# Patient Record
Sex: Female | Born: 1955 | Race: White | Hispanic: No | Marital: Married | Smoking: Former smoker
Health system: Southern US, Community
[De-identification: ages and names within clinical notes are randomized; demographics above are authoritative.]

## PROBLEM LIST (undated history)

## (undated) DIAGNOSIS — G4700 Insomnia, unspecified: Secondary | ICD-10-CM

## (undated) DIAGNOSIS — K760 Fatty (change of) liver, not elsewhere classified: Secondary | ICD-10-CM

## (undated) DIAGNOSIS — I10 Essential (primary) hypertension: Secondary | ICD-10-CM

## (undated) DIAGNOSIS — E785 Hyperlipidemia, unspecified: Secondary | ICD-10-CM

## (undated) DIAGNOSIS — M25561 Pain in right knee: Secondary | ICD-10-CM

## (undated) DIAGNOSIS — F320 Major depressive disorder, single episode, mild: Secondary | ICD-10-CM

## (undated) DIAGNOSIS — M25562 Pain in left knee: Secondary | ICD-10-CM

## (undated) DIAGNOSIS — M545 Low back pain, unspecified: Secondary | ICD-10-CM

## (undated) DIAGNOSIS — E669 Obesity, unspecified: Secondary | ICD-10-CM

## (undated) DIAGNOSIS — R0600 Dyspnea, unspecified: Secondary | ICD-10-CM

## (undated) DIAGNOSIS — F32A Depression, unspecified: Secondary | ICD-10-CM

## (undated) DIAGNOSIS — N951 Menopausal and female climacteric states: Secondary | ICD-10-CM

## (undated) DIAGNOSIS — R945 Abnormal results of liver function studies: Secondary | ICD-10-CM

## (undated) DIAGNOSIS — J309 Allergic rhinitis, unspecified: Secondary | ICD-10-CM

## (undated) DIAGNOSIS — M1990 Unspecified osteoarthritis, unspecified site: Secondary | ICD-10-CM

## (undated) DIAGNOSIS — K219 Gastro-esophageal reflux disease without esophagitis: Secondary | ICD-10-CM

## (undated) HISTORY — DX: Essential (primary) hypertension: I10

## (undated) HISTORY — DX: Pain in left knee: M25.561

## (undated) HISTORY — DX: Pain in left knee: M25.562

## (undated) HISTORY — DX: Insomnia, unspecified: G47.00

## (undated) HISTORY — DX: Hyperlipidemia, unspecified: E78.5

## (undated) HISTORY — DX: Low back pain: M54.5

## (undated) HISTORY — PX: LAPAROSCOPY ABDOMEN DIAGNOSTIC: PRO50

## (undated) HISTORY — DX: Major depressive disorder, single episode, mild: F32.0

## (undated) HISTORY — DX: Unspecified osteoarthritis, unspecified site: M19.90

## (undated) HISTORY — DX: Abnormal results of liver function studies: R94.5

## (undated) HISTORY — DX: Allergic rhinitis, unspecified: J30.9

## (undated) HISTORY — DX: Obesity, unspecified: E66.9

## (undated) HISTORY — DX: Menopausal and female climacteric states: N95.1

## (undated) HISTORY — DX: Low back pain, unspecified: M54.50

## (undated) HISTORY — DX: Depression, unspecified: F32.A

## (undated) HISTORY — DX: Gastro-esophageal reflux disease without esophagitis: K21.9

---

## 2000-06-06 HISTORY — PX: CATARACT EXTRACTION W/ INTRAOCULAR LENS IMPLANT: SHX1309

## 2006-09-05 ENCOUNTER — Ambulatory Visit: Payer: Self-pay | Admitting: Family Medicine

## 2006-09-05 DIAGNOSIS — I10 Essential (primary) hypertension: Secondary | ICD-10-CM | POA: Insufficient documentation

## 2006-09-05 HISTORY — PX: CARDIAC CATHETERIZATION: SHX172

## 2006-09-06 ENCOUNTER — Ambulatory Visit: Payer: Self-pay | Admitting: Cardiology

## 2006-09-06 LAB — CONVERTED CEMR LAB
BUN: 18 mg/dL (ref 6–23)
Basophils Absolute: 0 10*3/uL (ref 0.0–0.1)
Calcium: 9 mg/dL (ref 8.4–10.5)
Chloride: 108 meq/L (ref 96–112)
Creatinine, Ser: 0.9 mg/dL (ref 0.4–1.2)
Direct LDL: 159.2 mg/dL
Eosinophils Absolute: 0.1 10*3/uL (ref 0.0–0.6)
GFR calc non Af Amer: 71 mL/min
HCT: 36.3 % (ref 36.0–46.0)
HDL: 59.2 mg/dL (ref >39.0)
INR: 0.7 — ABNORMAL LOW (ref 0.9–2.0)
MCHC: 34.6 g/dL (ref 30.0–36.0)
MCV: 84.8 fL (ref 78.0–100.0)
Monocytes Relative: 9.4 % (ref 3.0–11.0)
Neutrophils Relative %: 46.8 % (ref 43.0–77.0)
Platelets: 313 10*3/uL (ref 150–400)
RBC: 4.29 M/uL (ref 3.87–5.11)
RDW: 15.6 % — ABNORMAL HIGH (ref 11.5–14.6)
Total CHOL/HDL Ratio: 4.5
VLDL: 58 mg/dL — ABNORMAL HIGH (ref 0–40)
aPTT: 20.1 s — ABNORMAL LOW (ref 26.5–36.5)

## 2006-09-08 ENCOUNTER — Inpatient Hospital Stay (HOSPITAL_BASED_OUTPATIENT_CLINIC_OR_DEPARTMENT_OTHER): Admission: RE | Admit: 2006-09-08 | Discharge: 2006-09-08 | Payer: Self-pay | Admitting: Cardiology

## 2006-09-08 ENCOUNTER — Ambulatory Visit: Payer: Self-pay | Admitting: Cardiovascular Disease

## 2006-10-04 ENCOUNTER — Ambulatory Visit: Payer: Self-pay | Admitting: Cardiology

## 2006-10-04 ENCOUNTER — Ambulatory Visit: Payer: Self-pay | Admitting: Family Medicine

## 2006-10-04 DIAGNOSIS — E785 Hyperlipidemia, unspecified: Secondary | ICD-10-CM | POA: Insufficient documentation

## 2006-10-04 DIAGNOSIS — K219 Gastro-esophageal reflux disease without esophagitis: Secondary | ICD-10-CM | POA: Insufficient documentation

## 2006-10-05 ENCOUNTER — Telehealth: Payer: Self-pay | Admitting: Family Medicine

## 2006-11-16 ENCOUNTER — Other Ambulatory Visit: Admission: RE | Admit: 2006-11-16 | Discharge: 2006-11-16 | Payer: Self-pay | Admitting: Family Medicine

## 2006-11-16 ENCOUNTER — Ambulatory Visit: Payer: Self-pay | Admitting: Family Medicine

## 2006-11-16 ENCOUNTER — Encounter: Payer: Self-pay | Admitting: Family Medicine

## 2006-11-16 ENCOUNTER — Encounter: Admission: RE | Admit: 2006-11-16 | Discharge: 2006-11-16 | Payer: Self-pay | Admitting: Family Medicine

## 2006-11-23 ENCOUNTER — Telehealth (INDEPENDENT_AMBULATORY_CARE_PROVIDER_SITE_OTHER): Payer: Self-pay | Admitting: *Deleted

## 2006-11-27 ENCOUNTER — Telehealth (INDEPENDENT_AMBULATORY_CARE_PROVIDER_SITE_OTHER): Payer: Self-pay | Admitting: *Deleted

## 2006-12-28 ENCOUNTER — Ambulatory Visit: Payer: Self-pay | Admitting: Family Medicine

## 2006-12-28 DIAGNOSIS — R0609 Other forms of dyspnea: Secondary | ICD-10-CM | POA: Insufficient documentation

## 2006-12-28 DIAGNOSIS — R0989 Other specified symptoms and signs involving the circulatory and respiratory systems: Secondary | ICD-10-CM | POA: Insufficient documentation

## 2007-01-24 ENCOUNTER — Encounter: Payer: Self-pay | Admitting: Family Medicine

## 2007-01-24 LAB — CONVERTED CEMR LAB
HDL: 62 mg/dL (ref >39)
LDL Cholesterol: 96 mg/dL (ref 0–99)
Triglycerides: 233 mg/dL — ABNORMAL HIGH (ref <150)

## 2007-03-30 ENCOUNTER — Ambulatory Visit: Payer: Self-pay | Admitting: Family Medicine

## 2007-03-30 DIAGNOSIS — F329 Major depressive disorder, single episode, unspecified: Secondary | ICD-10-CM | POA: Insufficient documentation

## 2007-07-10 ENCOUNTER — Ambulatory Visit: Payer: Self-pay | Admitting: Family Medicine

## 2007-11-07 ENCOUNTER — Ambulatory Visit: Payer: Self-pay | Admitting: Family Medicine

## 2007-11-07 DIAGNOSIS — R945 Abnormal results of liver function studies: Secondary | ICD-10-CM | POA: Insufficient documentation

## 2007-11-08 ENCOUNTER — Encounter: Payer: Self-pay | Admitting: Family Medicine

## 2007-11-08 LAB — CONVERTED CEMR LAB
ALT: 115 units/L — ABNORMAL HIGH (ref 0–35)
AST: 83 units/L — ABNORMAL HIGH (ref 0–37)

## 2007-11-12 LAB — CONVERTED CEMR LAB
HCV Ab: NEGATIVE
Hep B S Ab: NEGATIVE
Hepatitis B Surface Ag: NEGATIVE

## 2007-11-13 ENCOUNTER — Encounter: Admission: RE | Admit: 2007-11-13 | Discharge: 2007-11-13 | Payer: Self-pay | Admitting: Family Medicine

## 2007-12-04 ENCOUNTER — Other Ambulatory Visit: Admission: RE | Admit: 2007-12-04 | Discharge: 2007-12-04 | Payer: Self-pay | Admitting: Family Medicine

## 2007-12-04 ENCOUNTER — Encounter: Payer: Self-pay | Admitting: Family Medicine

## 2007-12-04 ENCOUNTER — Encounter: Admission: RE | Admit: 2007-12-04 | Discharge: 2007-12-04 | Payer: Self-pay | Admitting: Family Medicine

## 2007-12-04 ENCOUNTER — Ambulatory Visit: Payer: Self-pay | Admitting: Family Medicine

## 2007-12-18 ENCOUNTER — Encounter: Admission: RE | Admit: 2007-12-18 | Discharge: 2007-12-18 | Payer: Self-pay | Admitting: Family Medicine

## 2007-12-19 ENCOUNTER — Encounter: Payer: Self-pay | Admitting: Family Medicine

## 2007-12-20 ENCOUNTER — Encounter: Payer: Self-pay | Admitting: Family Medicine

## 2007-12-20 LAB — HM COLONOSCOPY

## 2008-01-31 ENCOUNTER — Encounter: Payer: Self-pay | Admitting: Family Medicine

## 2008-02-04 LAB — CONVERTED CEMR LAB
ALT: 31 units/L (ref 0–35)
AST: 33 units/L (ref 0–37)
BUN: 13 mg/dL (ref 6–23)
CO2: 25 meq/L (ref 19–32)
Calcium: 9.9 mg/dL (ref 8.4–10.5)
Chloride: 105 meq/L (ref 96–112)
Cholesterol: 236 mg/dL — ABNORMAL HIGH (ref 0–200)
Creatinine, Ser: 1.06 mg/dL (ref 0.40–1.20)
HDL: 61 mg/dL (ref >39)
Total Bilirubin: 0.4 mg/dL (ref 0.3–1.2)
Total CHOL/HDL Ratio: 3.9
VLDL: 37 mg/dL (ref 0–40)

## 2008-03-04 ENCOUNTER — Ambulatory Visit: Payer: Self-pay | Admitting: Family Medicine

## 2008-03-04 DIAGNOSIS — M79609 Pain in unspecified limb: Secondary | ICD-10-CM | POA: Insufficient documentation

## 2008-05-16 ENCOUNTER — Ambulatory Visit: Payer: Self-pay | Admitting: Family Medicine

## 2008-05-16 DIAGNOSIS — M545 Low back pain, unspecified: Secondary | ICD-10-CM | POA: Insufficient documentation

## 2008-08-04 HISTORY — PX: DOBUTAMINE STRESS ECHO: SHX5426

## 2008-08-06 ENCOUNTER — Encounter: Admission: RE | Admit: 2008-08-06 | Discharge: 2008-08-06 | Payer: Self-pay | Admitting: Family Medicine

## 2008-08-06 ENCOUNTER — Encounter: Payer: Self-pay | Admitting: Family Medicine

## 2008-08-06 DIAGNOSIS — J309 Allergic rhinitis, unspecified: Secondary | ICD-10-CM | POA: Insufficient documentation

## 2008-08-06 DIAGNOSIS — R0902 Hypoxemia: Secondary | ICD-10-CM | POA: Insufficient documentation

## 2008-08-06 DIAGNOSIS — R079 Chest pain, unspecified: Secondary | ICD-10-CM | POA: Insufficient documentation

## 2008-08-25 ENCOUNTER — Ambulatory Visit: Payer: Self-pay | Admitting: Family Medicine

## 2008-09-10 ENCOUNTER — Ambulatory Visit: Payer: Self-pay | Admitting: Cardiology

## 2008-09-10 ENCOUNTER — Encounter: Payer: Self-pay | Admitting: Cardiology

## 2008-10-20 ENCOUNTER — Ambulatory Visit: Payer: Self-pay

## 2008-10-20 ENCOUNTER — Encounter: Payer: Self-pay | Admitting: Cardiology

## 2008-10-27 ENCOUNTER — Ambulatory Visit: Payer: Self-pay | Admitting: Family Medicine

## 2008-12-11 ENCOUNTER — Ambulatory Visit: Payer: Self-pay | Admitting: Family Medicine

## 2008-12-23 ENCOUNTER — Encounter: Admission: RE | Admit: 2008-12-23 | Discharge: 2008-12-23 | Payer: Self-pay | Admitting: Family Medicine

## 2008-12-29 ENCOUNTER — Encounter: Admission: RE | Admit: 2008-12-29 | Discharge: 2008-12-29 | Payer: Self-pay | Admitting: Family Medicine

## 2009-02-03 ENCOUNTER — Encounter: Payer: Self-pay | Admitting: Family Medicine

## 2009-02-03 LAB — CONVERTED CEMR LAB
HDL: 57 mg/dL (ref >39)
LDL Cholesterol: 129 mg/dL — ABNORMAL HIGH (ref 0–99)
Triglycerides: 220 mg/dL — ABNORMAL HIGH (ref <150)
VLDL: 44 mg/dL — ABNORMAL HIGH (ref 0–40)

## 2009-08-19 ENCOUNTER — Ambulatory Visit: Payer: Self-pay | Admitting: Family Medicine

## 2009-08-19 DIAGNOSIS — R928 Other abnormal and inconclusive findings on diagnostic imaging of breast: Secondary | ICD-10-CM | POA: Insufficient documentation

## 2009-08-24 ENCOUNTER — Encounter: Admission: RE | Admit: 2009-08-24 | Discharge: 2009-08-24 | Payer: Self-pay | Admitting: Family Medicine

## 2009-12-08 ENCOUNTER — Ambulatory Visit: Payer: Self-pay | Admitting: Family Medicine

## 2009-12-08 DIAGNOSIS — R350 Frequency of micturition: Secondary | ICD-10-CM | POA: Insufficient documentation

## 2009-12-16 ENCOUNTER — Ambulatory Visit: Payer: Self-pay | Admitting: Family Medicine

## 2009-12-17 ENCOUNTER — Encounter: Payer: Self-pay | Admitting: Family Medicine

## 2009-12-18 LAB — CONVERTED CEMR LAB
CO2: 29 meq/L (ref 19–32)
Chloride: 98 meq/L (ref 96–112)
Platelets: 284 10*3/uL (ref 150–400)
Potassium: 3.9 meq/L (ref 3.5–5.3)
Pro B Natriuretic peptide (BNP): 6.3 pg/mL (ref 0.0–100.0)
RDW: 16.2 % — ABNORMAL HIGH (ref 11.5–15.5)
Sodium: 140 meq/L (ref 135–145)
WBC: 7.4 10*3/uL (ref 4.0–10.5)

## 2010-01-05 ENCOUNTER — Ambulatory Visit: Payer: Self-pay | Admitting: Family Medicine

## 2010-01-05 DIAGNOSIS — N950 Postmenopausal bleeding: Secondary | ICD-10-CM | POA: Insufficient documentation

## 2010-01-25 ENCOUNTER — Ambulatory Visit: Payer: Self-pay | Admitting: Family Medicine

## 2010-01-25 DIAGNOSIS — G43711 Chronic migraine without aura, intractable, with status migrainosus: Secondary | ICD-10-CM | POA: Insufficient documentation

## 2010-01-27 ENCOUNTER — Ambulatory Visit: Payer: Self-pay | Admitting: Obstetrics & Gynecology

## 2010-01-29 ENCOUNTER — Encounter: Admission: RE | Admit: 2010-01-29 | Discharge: 2010-01-29 | Payer: Self-pay | Admitting: Obstetrics & Gynecology

## 2010-02-05 ENCOUNTER — Telehealth: Payer: Self-pay | Admitting: Family Medicine

## 2010-02-10 ENCOUNTER — Ambulatory Visit: Payer: Self-pay | Admitting: Obstetrics & Gynecology

## 2010-02-15 ENCOUNTER — Encounter: Payer: Self-pay | Admitting: Family Medicine

## 2010-03-15 ENCOUNTER — Ambulatory Visit: Payer: Self-pay | Admitting: Family Medicine

## 2010-03-15 DIAGNOSIS — H3090 Unspecified chorioretinal inflammation, unspecified eye: Secondary | ICD-10-CM | POA: Insufficient documentation

## 2010-03-15 DIAGNOSIS — N259 Disorder resulting from impaired renal tubular function, unspecified: Secondary | ICD-10-CM | POA: Insufficient documentation

## 2010-03-15 DIAGNOSIS — N644 Mastodynia: Secondary | ICD-10-CM | POA: Insufficient documentation

## 2010-03-16 LAB — CONVERTED CEMR LAB
CO2: 27 meq/L (ref 19–32)
Chloride: 104 meq/L (ref 96–112)
Creatinine, Ser: 0.94 mg/dL (ref 0.40–1.20)
Hemoglobin: 13.6 g/dL (ref 12.0–15.0)
Potassium: 4.7 meq/L (ref 3.5–5.3)
Sodium: 141 meq/L (ref 135–145)

## 2010-04-02 ENCOUNTER — Telehealth: Payer: Self-pay | Admitting: Family Medicine

## 2010-04-06 ENCOUNTER — Ambulatory Visit: Payer: Self-pay | Admitting: Obstetrics & Gynecology

## 2010-04-06 ENCOUNTER — Ambulatory Visit (HOSPITAL_COMMUNITY): Admission: RE | Admit: 2010-04-06 | Discharge: 2010-04-06 | Payer: Self-pay | Admitting: Obstetrics & Gynecology

## 2010-04-06 ENCOUNTER — Telehealth (INDEPENDENT_AMBULATORY_CARE_PROVIDER_SITE_OTHER): Payer: Self-pay | Admitting: *Deleted

## 2010-04-26 ENCOUNTER — Encounter: Admission: RE | Admit: 2010-04-26 | Discharge: 2010-04-26 | Payer: Self-pay | Admitting: Family Medicine

## 2010-04-26 ENCOUNTER — Ambulatory Visit: Payer: Self-pay | Admitting: Family Medicine

## 2010-05-05 ENCOUNTER — Ambulatory Visit: Payer: Self-pay | Admitting: Internal Medicine

## 2010-05-18 ENCOUNTER — Ambulatory Visit: Payer: Self-pay | Admitting: Obstetrics & Gynecology

## 2010-06-08 ENCOUNTER — Ambulatory Visit: Admit: 2010-06-08 | Payer: Self-pay | Admitting: Nurse Practitioner

## 2010-06-21 ENCOUNTER — Ambulatory Visit: Admit: 2010-06-21 | Payer: Self-pay | Admitting: Family Medicine

## 2010-06-27 ENCOUNTER — Encounter: Payer: Self-pay | Admitting: Family Medicine

## 2010-07-06 NOTE — Assessment & Plan Note (Signed)
Summary: postmenopausal vag bleeding   Vital Signs:  Patient profile:   55 year old female Menstrual status:  postmenopausal Height:      64 inches Weight:      195 pounds BMI:     33.59 O2 Sat:      95 % on Room air Pulse rate:   91 / minute BP sitting:   135 / 84  (left arm) Cuff size:   large  Vitals Entered By: Payton Spark CMA (January 05, 2010 4:26 PM)  O2 Flow:  Room air CC: Vaginal bleeding this AM. Postmenopausal x 2 yrs. Denies any pain.     Menstrual Status postmenopausal Last PAP Result Normal   Primary Care Anddy Wingert:  Seymour Bars D.O.  CC:  Vaginal bleeding this AM. Postmenopausal x 2 yrs. Denies any pain.Marland Kitchen  History of Present Illness: 55 yo WF presents for postmenopausal bleeding today.  She had pink spotting in her underwear this morning.  She has not had a period in 2 yrs.  She is not on any hormone therapy.  Denies any vaginal pain, vaginal discharge or lesions.  No recent intercourse.      Current Medications (verified): 1)  Multivitamin/iron  Tabs (Multiple Vitamins-Iron) .... Take 1 Tablet By Mouth Once A Day 2)  Fish Oil 1000 Mg Caps (Omega-3 Fatty Acids) .... Take 1 Tablet By Mouth Once A Day 3)  Pravastatin Sodium 80 Mg Tabs (Pravastatin Sodium) .... Take 1 Tablet By Mouth Once A Day At Bedtime 4)  Carvedilol 6.25 Mg  Tabs (Carvedilol) .Marland Kitchen.. 1 Tab By Mouth Two Times A Day 5)  Wellbutrin Xl 300 Mg Xr24h-Tab (Bupropion Hcl) .Marland Kitchen.. 1 Tab By Mouth Daily 6)  Aspirin 81 Mg Tbec (Aspirin) .... Take One Tablet By Mouth Daily 7)  Niacin Cr 500 Mg Cr-Caps (Niacin) .... Qd 8)  Garlic Oil 500 Mg Caps (Garlic) .... Qd 9)  Glucosamine-Chondroitin 500-400 Mg Caps (Glucosamine-Chondroitin) .... Qd 10)  Ca With Zinc and Mag .... Qd 11)  Hydrochlorothiazide 12.5 Mg Caps (Hydrochlorothiazide) .Marland Kitchen.. 1 Tab By Mouth Qam  Allergies (verified): 1)  ! Cymbalta  Past History:  Past Medical History: Reviewed history from 10/27/2008 and no changes required. Current  Problems:  HYPERTENSION, BENIGN ESSENTIAL (ICD-401.1) HYPERLIPIDEMIA (ICD-272.4) ALLERGIC RHINITIS CAUSE UNSPECIFIED (ICD-477.9) LUMBAGO (ICD-724.2) LIVER FUNCTION TESTS, ABNORMAL (ICD-794.8) DEPRESSION, MILD (ICD-311) GERD (ICD-530.81)   bilat knee pain, OA perimenopausal insomnia migraines obesity  Past Surgical History: Reviewed history from 10/27/2008 and no changes required. cardiac cath- clean,Dr Crenshaw 4-08 Laproscopic abdominal surgery  stress echo 08-2008  Social History: Reviewed history from 09/02/2008 and no changes required. 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  No children. Moved from [place], then [place].  Neices and Nephews are local.  Quit smoking in 1998.  Occas. ETOH.  Not sexually active.  Perimenopausal.  Does not exercise.  Review of Systems      See HPI  Physical Exam  General:  alert, well-developed, well-nourished, and well-hydrated. obese Genitalia:  normal introitus, no external lesions, no vaginal discharge, mucosa pink and moist, no vaginal or cervical lesions, and no friaility or hemorrhage.  scant amt of bright red blood coming out of the cervical os. Skin:  color normal.  no pallor or bruising    Impression & Recommendations:  Problem # 1:  POSTMENOPAUSAL BLEEDING (ICD-627.1) Assessment New 2 yrs postmenopausal with new onset bleeding today, mild.  Will need endometrial biopsy by gyn.  Will Refer to Dr Marice Potter for this.  At risk for endometrial hyperplasia given wt gain. Orders: Gynecologic Referral (Gyn)  Complete Medication List: 1)  Multivitamin/iron Tabs (Multiple vitamins-iron) .... Take 1 tablet by mouth once a day 2)  Fish Oil 1000 Mg Caps (Omega-3 fatty acids) .... Take 1 tablet by mouth once a day 3)  Pravastatin Sodium 80 Mg Tabs (Pravastatin sodium) .... Take 1 tablet by mouth once a day at bedtime 4)  Carvedilol 6.25 Mg Tabs (Carvedilol) .Marland Kitchen.. 1 tab by mouth two times a day 5)  Wellbutrin Xl 300 Mg Xr24h-tab (Bupropion  hcl) .Marland Kitchen.. 1 tab by mouth daily 6)  Aspirin 81 Mg Tbec (Aspirin) .... Take one tablet by mouth daily 7)  Niacin Cr 500 Mg Cr-caps (Niacin) .... Qd 8)  Garlic Oil 500 Mg Caps (Garlic) .... Qd 9)  Glucosamine-chondroitin 500-400 Mg Caps (Glucosamine-chondroitin) .... Qd 10)  Ca With Zinc and Mag  .... Qd 11)  Hydrochlorothiazide 12.5 Mg Caps (Hydrochlorothiazide) .Marland Kitchen.. 1 tab by mouth qam  Patient Instructions: 1)  GYN referral made for down the hall -- you will need an endometrial biospy. 2)  Call me if there is anything you need.

## 2010-07-06 NOTE — Assessment & Plan Note (Signed)
Summary: 6 week f/u and PFT's - jr  CC: attempted spirometry patient could not perform and complete the test due to cough being triggered.   Primary Care Provider:  Seymour Bars D.O.  CC:  attempted spirometry patient could not perform and complete the test due to cough being triggered.Marland Kitchen  History of Present Illness: 55 yo WF presented for PFTs today but she continued to cough in expiration, so unable to get an adequate test result today.  Denies being acutely sick today.  She has had DOE and low pulse ox.  She is due for a CXR.  Quit smoking years ago.  Allergies: 1)  ! Cymbalta 2)  ! * Topiramate  Social History: Reviewed history from 09/02/2008 and no changes required. 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  No children. Moved from [place], then [place].  Neices and Nephews are local.  Quit smoking in 1998.  Occas. ETOH.  Not sexually active.  Perimenopausal.  Does not exercise.   Impression & Recommendations:  Problem # 1:  DYSPNEA ON EXERTION (ICD-786.09) Hgb normal 12-2009 (13.9), normal stress echo March 2010, EKG RBBB, unchanged 12-16-09.   CXR today.  Unable to get adequate PFTs today so will get this set up with pulm.  Suspect poor exercise tolerance and obesity hypoventilation syndrome. Her updated medication list for this problem includes:    Carvedilol 6.25 Mg Tabs (Carvedilol) .Marland Kitchen... 1 tab by mouth two times a day    Hydrochlorothiazide 12.5 Mg Caps (Hydrochlorothiazide) .Marland Kitchen... 1 tab by mouth qam  Orders: T-DG Chest 2 View [phone]) Pulmonary Referral (Pulmonary)  Complete Medication List: 1)  Multivitamin/iron Tabs (Multiple vitamins-iron) .... Take 1 tablet by mouth once a day 2)  Fish Oil 1000 Mg Caps (Omega-3 fatty acids) .... Take 1 tablet by mouth once a day 3)  Pravastatin Sodium 80 Mg Tabs (Pravastatin sodium) .... Take 1 tablet by mouth once a day at bedtime 4)  Carvedilol 6.25 Mg Tabs (Carvedilol) .Marland Kitchen.. 1 tab by mouth two times a day 5)  Wellbutrin Xl 300 Mg  Xr24h-tab (Bupropion hcl) .Marland Kitchen.. 1 tab by mouth daily 6)  Aspirin 81 Mg Tbec (Aspirin) .... Take one tablet by mouth daily 7)  Niacin Cr 500 Mg Cr-caps (Niacin) .... Qd 8)  Garlic Oil 500 Mg Caps (Garlic) .... Qd 9)  Glucosamine-chondroitin 500-400 Mg Caps (Glucosamine-chondroitin) .... Qd 10)  Ca With Zinc and Mag  .... Qd 11)  Hydrochlorothiazide 12.5 Mg Caps (Hydrochlorothiazide) .Marland Kitchen.. 1 tab by mouth qam Prescriptions: HYDROCHLOROTHIAZIDE 12.5 MG CAPS (HYDROCHLOROTHIAZIDE) 1 tab by mouth qAM  #90 x 2   Entered by:   Kathlene November LPN   Authorized by:   Seymour Bars DO   Signed by:   Kathlene November LPN on 65/78/4696   Method used:   Electronically to        [facility] [phone]* (retail)       [address] [place], [place]  [address]       Ph: [phone]       Fax: [phone]   RxID:   [id number] WELLBUTRIN XL 300 MG XR24H-TAB (BUPROPION HCL) 1 tab by mouth daily  #90 x 2   Entered by:   Kathlene November LPN   Authorized by:   Seymour Bars DO   Signed by:   Kathlene November LPN on 88/41/6606   Method used:   Electronically to        [facility] [phone]* (retail)       [address] [place], [place]  [address]       Ph: [phone]       Fax: [phone]   RxID:   [id number] CARVEDILOL 6.25 MG  TABS (CARVEDILOL) 1 tab by mouth two times a day  #180 x 2   Entered by:   Kathlene November LPN   Authorized by:   Seymour Bars DO   Signed by:   Kathlene November LPN on 27/25/3664   Method used:   Electronically to        [facility] [phone]* (retail)       [address] [place], [place]  [address]       Ph: [phone]       Fax: [phone]   RxID:   [id number]    Orders Added: 1)  T-DG Chest 2 View [71020] 2)  Est. Patient Level I [32202] 3)  Pulmonary Referral [Pulmonary]

## 2010-07-06 NOTE — Progress Notes (Signed)
Summary: Records Request  Faxed EKG to [facility] ([phone]). Debby Freiberg  April 06, 2010 12:25 PM

## 2010-07-06 NOTE — Progress Notes (Signed)
Summary: Desipramine SE  Phone Note Call from Patient   Caller: Patient Summary of Call: Pt LMOM stating Desipramine is not helping w/ HA. She also states that it makes her feel "drunk" and "ill". Pt states HA has come back just as it was last week. Pt would like med changed or apt for today. Please advise. Initial call taken by: Payton Spark CMA,  February 05, 2010 10:00 AM  Follow-up for Phone Call        stop desipramine.  Replace with topiramate.    Return for f/u in 3 wks. Follow-up by: Seymour Bars DO,  February 05, 2010 10:18 AM    New/Updated Medications: TOPIRAMATE 25 MG TABS (TOPIRAMATE) 1 tab by mouth at bedtime x 1 wk then increase to 2 tabs by mouth qhs Prescriptions: TOPIRAMATE 25 MG TABS (TOPIRAMATE) 1 tab by mouth at bedtime x 1 wk then increase to 2 tabs by mouth qhs  #60 x 1   Entered and Authorized by:   Seymour Bars DO   Signed by:   Seymour Bars DO on 02/05/2010   Method used:   Electronically to        [facility] [phone]* (retail)       [address] [place], [place]  [address]       Ph: [phone]       Fax: [phone]   RxID:   [id number]   Appended Document: Desipramine SE Pt aware of the above

## 2010-07-06 NOTE — Letter (Signed)
Summary: Depression Questionnaire  Depression Questionnaire   Imported By: Lanelle Bal 01/29/2010 11:42:59  _____________________________________________________________________  External Attachment:    Type:   Image     Comment:   External Document

## 2010-07-06 NOTE — Assessment & Plan Note (Signed)
Summary: L breast f/u   Vital Signs:  Patient profile:   54 year old female Height:      64 inches Weight:      196 pounds BMI:     33.76 O2 Sat:      95 % on Room air Pulse rate:   89 / minute BP sitting:   145 / 94  (left arm) Cuff size:   regular  Vitals Entered By: Payton Spark CMA (August 19, 2009 1:03 PM)  O2 Flow:  Room air CC: F/U lump on L breast. Also needs all meds refilled   Primary Care Provider:  Seymour Bars D.O.  CC:  F/U lump on L breast. Also needs all meds refilled.  History of Present Illness: 55 yo WF presents for f/u L breast lump with abnormal screening mammogram from July 2010.   A diagnostic L mammo with u/s was performed and nothing abnormal was seen.  She is due for the 6 month f/u study and has yet to do this at the [facility].  Due for med RFs today.  Considering breast reduction surgery since she is a 39 DDD and has back and shoulder pain.    Current Medications (verified): 1)  Multivitamin/iron  Tabs (Multiple Vitamins-Iron) .... Take 1 Tablet By Mouth Once A Day 2)  Fish Oil 1000 Mg Caps (Omega-3 Fatty Acids) .... Take 1 Tablet By Mouth Once A Day 3)  Pravastatin Sodium 80 Mg Tabs (Pravastatin Sodium) .... Take 1 Tablet By Mouth Once A Day At Bedtime 4)  Carvedilol 6.25 Mg  Tabs (Carvedilol) .Marland Kitchen.. 1 Tab By Mouth Two Times A Day 5)  Citalopram Hydrobromide 20 Mg  Tabs (Citalopram Hydrobromide) .Marland Kitchen.. 1 Tab By Mouth Daily 6)  Wellbutrin Xl 300 Mg Xr24h-Tab (Bupropion Hcl) .Marland Kitchen.. 1 Tab By Mouth Daily 7)  Aspirin 81 Mg Tbec (Aspirin) .... Take One Tablet By Mouth Daily  Allergies (verified): No Known Drug Allergies  Past History:  Past Medical History: Reviewed history from 10/27/2008 and no changes required. Current Problems:  HYPERTENSION, BENIGN ESSENTIAL (ICD-401.1) HYPERLIPIDEMIA (ICD-272.4) ALLERGIC RHINITIS CAUSE UNSPECIFIED (ICD-477.9) LUMBAGO (ICD-724.2) LIVER FUNCTION TESTS, ABNORMAL (ICD-794.8) DEPRESSION, MILD (ICD-311) GERD  (ICD-530.81)   bilat knee pain, OA perimenopausal insomnia migraines obesity  Past Surgical History: Reviewed history from 10/27/2008 and no changes required. cardiac cath- clean,Dr Crenshaw 4-08 Laproscopic abdominal surgery  stress echo 08-2008  Social History: Reviewed history from 09/02/2008 and no changes required. 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  No children. Moved from [place], then [place].  Neices and Nephews are local.  Quit smoking in 1998.  Occas. ETOH.  Not sexually active.  Perimenopausal.  Does not exercise.  Review of Systems      See HPI  Physical Exam  General:  overwt WF in NAD Breasts:  large pendulous breasts w/o palpable masses.  No axillary LA or nipple discharge. Lungs:  Normal respiratory effort, chest expands symmetrically. Lungs are clear to auscultation, no crackles or wheezes. Heart:  Normal rate and regular rhythm. S1 and S2 normal without gallop, murmur, click, rub or other extra sounds.   Impression & Recommendations:  Problem # 1:  ABNORMAL MAMMOGRAM (ICD-793.80) Breast exam normal other than very large pendulous breasts.  Update L breast diagnostic mammogram at the [facility].   Discussed breast reduction options. Orders: T-Mammography, Diagnostic (unilateral) ([address])  Problem # 2:  HYPERTENSION, BENIGN ESSENTIAL (ICD-401.1) BP is high today.  RFd meds.  Update labs at  f/u visit in 3 mos.   Her updated medication list for this problem includes:    Carvedilol 6.25 Mg Tabs (Carvedilol) .Marland Kitchen... 1 tab by mouth two times a day  BP today: 145/94 Prior BP: 132/88 (12/11/2008)  Prior 10 Yr Risk Heart Disease: 7 % (02/04/2008)  Labs Reviewed: K+: 5.0 (01/31/2008) Creat: : 1.06 (01/31/2008)   Chol: 230 (02/03/2009)   HDL: 57 (02/03/2009)   LDL: 129 (02/03/2009)   TG: 220 (02/03/2009)  Complete Medication List: 1)  Multivitamin/iron Tabs (Multiple vitamins-iron) .... Take 1 tablet by mouth once a day 2)  Fish Oil  1000 Mg Caps (Omega-3 fatty acids) .... Take 1 tablet by mouth once a day 3)  Pravastatin Sodium 80 Mg Tabs (Pravastatin sodium) .... Take 1 tablet by mouth once a day at bedtime 4)  Carvedilol 6.25 Mg Tabs (Carvedilol) .Marland Kitchen.. 1 tab by mouth two times a day 5)  Citalopram Hydrobromide 20 Mg Tabs (Citalopram hydrobromide) .Marland Kitchen.. 1 tab by mouth daily 6)  Wellbutrin Xl 300 Mg Xr24h-tab (Bupropion hcl) .Marland Kitchen.. 1 tab by mouth daily 7)  Aspirin 81 Mg Tbec (Aspirin) .... Take one tablet by mouth daily  Patient Instructions: 1)  Will schedule your mammogram in [facility] for f/u. 2)  Meds RFd. 3)  Work on healthy diet and regular exercise.   4)  Consider breast reduction surgery. 5)  REturn for f/u mood with fasting labs in 3 mos. //////Prescriptions: WELLBUTRIN XL 300 MG XR24H-TAB (BUPROPION HCL) 1 tab by mouth daily  #30 x 3   Entered by:   Payton Spark CMA   Authorized by:   Seymour Bars DO   Signed by:   Seymour Bars DO on 08/19/2009   Method used:   Electronically to        [facility] [phone]* (retail)       [address]       [place], [place]  [address]       Ph: [phone]       Fax: [phone]   RxID:   [id number] CITALOPRAM HYDROBROMIDE 20 MG  TABS (CITALOPRAM HYDROBROMIDE) 1 tab by mouth daily  #90 x 1   Entered by:   Payton Spark CMA   Authorized by:   Seymour Bars DO   Signed by:   Seymour Bars DO on 08/19/2009   Method used:   Electronically to        [facility] [phone]* (retail)       [address]       [place], [place]  [address]       Ph: [phone]       Fax: [phone]   RxID:   [id number] CARVEDILOL 6.25 MG  TABS (CARVEDILOL) 1 tab by mouth two times a day  #180 x 3   Entered by:   Payton Spark CMA   Authorized by:   Seymour Bars DO   Signed by:   Seymour Bars DO on 08/19/2009   Method used:   Electronically to        [facility] [phone]* (retail)       [address]       [place], [place]  [address]       Ph: [phone]       Fax:  [phone]   RxID:   [id number] PRAVASTATIN SODIUM 80 MG TABS (PRAVASTATIN SODIUM) Take 1 tablet by mouth once a day at bedtime  #90 x 1   Entered by:  Payton Spark CMA   Authorized by:   Seymour Bars DO   Signed by:   Seymour Bars DO on 08/19/2009   Method used:   Electronically to        [facility] [phone]* (retail)       [address] [place], [place]  [address]       Ph: [phone]       Fax: [phone]   RxID:   [id number]

## 2010-07-06 NOTE — Progress Notes (Signed)
Summary: [facility] Record request  Phone Note From Other Clinic Call back at [phone]   Caller: Nurse Summary of Call: Corrie Dandy from [facility] needs a copy of pt's EKG and last OV notes faxed to [phone] Initial call taken by: Lannette Donath,  April 02, 2010 11:47 AM  Follow-up for Phone Call        faxed records. Follow-up by: Kathlene November LPN,  April 02, 2010 11:56 AM

## 2010-07-06 NOTE — Assessment & Plan Note (Signed)
Summary: f/u mood   Vital Signs:  Patient profile:   55 year old female Height:      64 inches Weight:      195 pounds BMI:     33.59 Temp:     98.5 degrees F oral Pulse rate:   81 / minute BP sitting:   136 / 94  (right arm) Cuff size:   large  Vitals Entered By: Duard Brady LPN (December 08, 1608 3:10 PM) CC: rov - doing ok Is Patient Diabetic? No   Primary Care Provider:  Seymour Bars D.O.  CC:  rov - doing ok.  History of Present Illness: 55 yo WF presents for f/u depression and HTN.  She is on Citalopram 20 mg/ day + Wellbutrin XR 300 mg/ day.  She reports that her brother died in [place] a couple months ago for unknown reasons.  He was a a drug abuser.  She did not get to attend a funeral but she plans to place his ashes in her church [employer] which she is happy about.  REports continuing to be depressed.  Has declined counseling.  She is taking only Carvedilol twice a day for HTN.  She has a poor diet w/ no exercise.  Has some dependent leg edema.  Denies blurry vision or HA.  Labs will be due in Sept.  Has frequent urination.  Allergies (verified): No Known Drug Allergies  Past History:  Past Medical History: Reviewed history from 10/27/2008 and no changes required. Current Problems:  HYPERTENSION, BENIGN ESSENTIAL (ICD-401.1) HYPERLIPIDEMIA (ICD-272.4) ALLERGIC RHINITIS CAUSE UNSPECIFIED (ICD-477.9) LUMBAGO (ICD-724.2) LIVER FUNCTION TESTS, ABNORMAL (ICD-794.8) DEPRESSION, MILD (ICD-311) GERD (ICD-530.81)   bilat knee pain, OA perimenopausal insomnia migraines obesity  Past Surgical History: Reviewed history from 10/27/2008 and no changes required. cardiac cath- clean,Dr Crenshaw 4-08 Laproscopic abdominal surgery  stress echo 08/25/2008  Family History: father died of AMI; 84 mother died of lung cancer brother- substance abuse, died unknown cause  Social History: Reviewed history from 09/02/2008 and no changes required. 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  No children. Moved from [place], then [place].  Neices and Nephews are local.  Quit smoking in 1998.  Occas. ETOH.  Not sexually active.  Perimenopausal.  Does not exercise.  Review of Systems      See HPI  Physical Exam  General:  alert, well-developed, well-nourished, and well-hydrated.  obese Head:  normocephalic and atraumatic.   Mouth:  pharynx pink and moist.   Neck:  no masses.   Lungs:  Normal respiratory effort, chest expands symmetrically. Lungs are clear to auscultation, no crackles or wheezes. Heart:  Normal rate and regular rhythm. S1 and S2 normal without gallop, murmur, click, rub or other extra sounds. Extremities:  trace ankle edema Skin:  color normal.   Cervical Nodes:  No lymphadenopathy noted Psych:  good eye contact and flat affect.     Impression & Recommendations:  Problem # 1:  DEPRESSION, MILD (ICD-311) PHQ-9 done today = 13 c/w moderate depression even on meds. Will taper off Citaloprma and then start on Cymbalta.  30 mg/ day x 2 wks then go up to 60 mg/ day.  Call if any problems.  Keep Wellbutrin on board.  Declined counseliung.  Has church family for support.  RTC in 2 mos.    Her updated medication list for this problem includes:    Citalopram Hydrobromide 20 Mg Tabs (Citalopram hydrobromide) .Marland Kitchen... 1/2 tab by mouth daily x 7 days then  stop.    Wellbutrin Xl 300 Mg Xr24h-tab (Bupropion hcl) .Marland Kitchen... 1 tab by mouth daily    Cymbalta 30 Mg Cpep (Duloxetine hcl) .Marland Kitchen... 1 capsule by mouth once a day x 2 wks then increase to 60 mg once a day  Problem # 2:  HYPERTENSION, BENIGN ESSENTIAL (ICD-401.1) Assessment: Deteriorated DBP high today.  Add Mazide once a day.  RTC in 2 mos for f/u with fasting labs. Her updated medication list for this problem includes:    Carvedilol 6.25 Mg Tabs (Carvedilol) .Marland Kitchen... 1 tab by mouth two times a day    Triamterene-hctz 37.5-25 Mg Caps (Triamterene-hctz) .Marland Kitchen... 1 tab by mouth daily  BP today: 136/94 Prior BP:  145/94 (08/19/2009)  Prior 10 Yr Risk Heart Disease: 7 % (02/04/2008)  Labs Reviewed: K+: 5.0 (01/31/2008) Creat: : 1.06 (01/31/2008)   Chol: 230 (02/03/2009)   HDL: 57 (02/03/2009)   LDL: 129 (02/03/2009)   TG: 220 (02/03/2009)  Problem # 3:  FREQUENCY, URINARY (ICD-788.41) Uable to give urine specimen today.  Consider OAB dx since this is ongoing.  Complete Medication List: 1)  Multivitamin/iron Tabs (Multiple vitamins-iron) .... Take 1 tablet by mouth once a day 2)  Fish Oil 1000 Mg Caps (Omega-3 fatty acids) .... Take 1 tablet by mouth once a day 3)  Pravastatin Sodium 80 Mg Tabs (Pravastatin sodium) .... Take 1 tablet by mouth once a day at bedtime 4)  Carvedilol 6.25 Mg Tabs (Carvedilol) .Marland Kitchen.. 1 tab by mouth two times a day 5)  Citalopram Hydrobromide 20 Mg Tabs (Citalopram hydrobromide) .... 1/2 tab by mouth daily x 7 days then stop. 6)  Wellbutrin Xl 300 Mg Xr24h-tab (Bupropion hcl) .Marland Kitchen.. 1 tab by mouth daily 7)  Aspirin 81 Mg Tbec (Aspirin) .... Take one tablet by mouth daily 8)  Niacin Cr 500 Mg Cr-caps (Niacin) .... Qd 9)  Garlic Oil 500 Mg Caps (Garlic) .... Qd 10)  Glucosamine-chondroitin 500-400 Mg Caps (Glucosamine-chondroitin) .... Qd 11)  Ca With Zinc and Mag  .... Qd 12)  Cymbalta 30 Mg Cpep (Duloxetine hcl) .Marland Kitchen.. 1 capsule by mouth once a day x 2 wks then increase to 60 mg once a day 13)  Triamterene-hctz 37.5-25 Mg Caps (Triamterene-hctz) .Marland Kitchen.. 1 tab by mouth daily  Patient Instructions: 1)  Cut Citalopram in 1/2 to 10 mg once a day x 7 days then STOP> 2)  Once off, start on Cymbalta 30 mg / day x 2 wks then go up to 60 mg / day. 3)  REturn for f/u in 6 wks. 4)  Will update fasting labs next visit. 5)  Add Maxzide once a day for BP. Prescriptions: WELLBUTRIN XL 300 MG XR24H-TAB (BUPROPION HCL) 1 tab by mouth daily  #30 x 3   Entered and Authorized by:   Seymour Bars DO   Signed by:   Seymour Bars DO on 12/08/2009   Method used:   Electronically to        [facility] [phone]* (retail)       [address] [place], [place]  [address]       Ph: [phone]       Fax: [phone]   RxID:   [id number] CARVEDILOL 6.25 MG  TABS (CARVEDILOL) 1 tab by mouth two times a day  #60 x 3   Entered and Authorized by:   Seymour Bars DO   Signed by:   Seymour Bars DO on 12/08/2009  Method used:   Electronically to        [facility] [phone]* (retail)       [address] [place], [place]  [address]       Ph: [phone]       Fax: [phone]   RxID:   [id number] PRAVASTATIN SODIUM 80 MG TABS (PRAVASTATIN SODIUM) Take 1 tablet by mouth once a day at bedtime  #30 x 2   Entered and Authorized by:   Seymour Bars DO   Signed by:   Seymour Bars DO on 12/08/2009   Method used:   Electronically to        [facility] [phone]* (retail)       [address] [place], [place]  [address]       Ph: [phone]       Fax: [phone]   RxID:   [id number] TRIAMTERENE-HCTZ 37.5-25 MG CAPS (TRIAMTERENE-HCTZ) 1 tab by mouth daily  #30 x 2   Entered and Authorized by:   Seymour Bars DO   Signed by:   Seymour Bars DO on 12/08/2009   Method used:   Electronically to        [facility] [phone]* (retail)       [address] [place], [place]  [address]       Ph: [phone]       Fax: [phone]   RxID:   [id number]

## 2010-07-06 NOTE — Assessment & Plan Note (Signed)
Summary: retinopathy/ BP   Vital Signs:  Patient profile:   55 year old female Menstrual status:  postmenopausal Height:      64 inches Weight:      198 pounds BMI:     34.11 O2 Sat:      93 % on Room air Pulse rate:   93 / minute BP sitting:   137 / 91  (left arm) Cuff size:   large  Vitals Entered By: Payton Spark CMA (March 15, 2010 4:21 PM)  O2 Flow:  Room air CC: F/U per eye doctor.   Primary Care Provider:  Seymour Bars D.O.  CC:  F/U per eye doctor.Marland Kitchen  History of Present Illness: 55 yo WF presents for f/u visit.  She recently had her eye exam at [facility] and was told that she had retinopathy.  She has HTN and has not been taking her Coreg and HCTZ b/c she thought she needed to stop both to prepare for her D&C which is not scheduled until Nov 1st.  She has not had DM and has an A1C of 5.5 today.  She also had a normal Hgb this summer.    She has a cough from reflux and allergies but she has not been taking anything.  Denies chest tightness, sputum production, hemoptysis and has chronic DOE from deconditioning and obesity.  She would like to persue breast reduction surgery.  Current Medications (verified): 1)  Multivitamin/iron  Tabs (Multiple Vitamins-Iron) .... Take 1 Tablet By Mouth Once A Day 2)  Fish Oil 1000 Mg Caps (Omega-3 Fatty Acids) .... Take 1 Tablet By Mouth Once A Day 3)  Pravastatin Sodium 80 Mg Tabs (Pravastatin Sodium) .... Take 1 Tablet By Mouth Once A Day At Bedtime 4)  Carvedilol 6.25 Mg  Tabs (Carvedilol) .Marland Kitchen.. 1 Tab By Mouth Two Times A Day 5)  Wellbutrin Xl 300 Mg Xr24h-Tab (Bupropion Hcl) .Marland Kitchen.. 1 Tab By Mouth Daily 6)  Aspirin 81 Mg Tbec (Aspirin) .... Take One Tablet By Mouth Daily 7)  Niacin Cr 500 Mg Cr-Caps (Niacin) .... Qd 8)  Garlic Oil 500 Mg Caps (Garlic) .... Qd 9)  Glucosamine-Chondroitin 500-400 Mg Caps (Glucosamine-Chondroitin) .... Qd 10)  Ca With Zinc and Mag .... Qd 11)  Hydrochlorothiazide 12.5 Mg Caps (Hydrochlorothiazide)  .Marland Kitchen.. 1 Tab By Mouth Qam  Allergies (verified): 1)  ! Cymbalta 2)  ! * Topiramate  Past History:  Past Medical History: Reviewed history from 10/27/2008 and no changes required. Current Problems:  HYPERTENSION, BENIGN ESSENTIAL (ICD-401.1) HYPERLIPIDEMIA (ICD-272.4) ALLERGIC RHINITIS CAUSE UNSPECIFIED (ICD-477.9) LUMBAGO (ICD-724.2) LIVER FUNCTION TESTS, ABNORMAL (ICD-794.8) DEPRESSION, MILD (ICD-311) GERD (ICD-530.81)   bilat knee pain, OA perimenopausal insomnia migraines obesity  Past Surgical History: Reviewed history from 10/27/2008 and no changes required. cardiac cath- clean,Dr Crenshaw 4-08 Laproscopic abdominal surgery  stress echo 08-2008  Social History: Reviewed history from 09/02/2008 and no changes required. 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  No children. Moved from [place], then [place].  Neices and Nephews are local.  Quit smoking in 1998.  Occas. ETOH.  Not sexually active.  Perimenopausal.  Does not exercise.  Review of Systems      See HPI  Physical Exam  General:  alert, well-developed, well-nourished, well-hydrated, and overweight-appearing.   Head:  normocephalic and atraumatic.   Eyes:  conjunctiva clear, no pallor Nose:  no nasal discharge.   Mouth:  pharynx pink and moist.   Neck:  no masses.   Lungs:  Normal respiratory effort,  chest expands symmetrically. Lungs are clear to auscultation, no crackles or wheezes. Heart:  Normal rate and regular rhythm. S1 and S2 normal without gallop, murmur, click, rub or other extra sounds. Extremities:  no LE edema Skin:  color normal and no rashes.   Cervical Nodes:  No lymphadenopathy noted Psych:  good eye contact, not anxious appearing, and not depressed appearing.     Impression & Recommendations:  Problem # 1:  RETINOPATHY (ICD-363.20) A1C normal at 5.5 today.  DBP running high.  Had a normal Hgb this summer.  HTN likely to be the cause.   Orders: Fingerstick (36416) Hemoglobin  A1C (83036)  Problem # 2:  RENAL INSUFFICIENCY (ICD-588.9) Due to repeat.   Orders: T-Basic Metabolic Panel [id number]) T-Hemoglobin [id number])  Problem # 3:  HYPERTENSION, BENIGN ESSENTIAL (ICD-401.1) BP high b/c she stopped her meds on her own.  Restart them both!  Likely the cause of her retinopathy is her HTN. Due to recheck renal fxn which was Cr of 1.28 in July. Her updated medication list for this problem includes:    Carvedilol 6.25 Mg Tabs (Carvedilol) .Marland Kitchen... 1 tab by mouth two times a day    Hydrochlorothiazide 12.5 Mg Caps (Hydrochlorothiazide) .Marland Kitchen... 1 tab by mouth qam  BP today: 137/91 Prior BP: 122/78 (01/25/2010)  Prior 10 Yr Risk Heart Disease: 7 % (02/04/2008)  Labs Reviewed: K+: 3.9 (12/17/2009) Creat: : 1.28 (12/17/2009)   Chol: 230 (02/03/2009)   HDL: 57 (02/03/2009)   LDL: 129 (02/03/2009)   TG: 220 (02/03/2009)  Problem # 4:  DYSPNEA ON EXERTION (ICD-786.09) She had a normal stress Echo in 2010 other than deconditioning and pulse ox of 93% at rest today.  She quit smoking years ago.  I will bring her back in for spirometry, CXR in 6 wks.   Her updated medication list for this problem includes:    Carvedilol 6.25 Mg Tabs (Carvedilol) .Marland Kitchen... 1 tab by mouth two times a day    Hydrochlorothiazide 12.5 Mg Caps (Hydrochlorothiazide) .Marland Kitchen... 1 tab by mouth qam  Complete Medication List: 1)  Multivitamin/iron Tabs (Multiple vitamins-iron) .... Take 1 tablet by mouth once a day 2)  Fish Oil 1000 Mg Caps (Omega-3 fatty acids) .... Take 1 tablet by mouth once a day 3)  Pravastatin Sodium 80 Mg Tabs (Pravastatin sodium) .... Take 1 tablet by mouth once a day at bedtime 4)  Carvedilol 6.25 Mg Tabs (Carvedilol) .Marland Kitchen.. 1 tab by mouth two times a day 5)  Wellbutrin Xl 300 Mg Xr24h-tab (Bupropion hcl) .Marland Kitchen.. 1 tab by mouth daily 6)  Aspirin 81 Mg Tbec (Aspirin) .... Take one tablet by mouth daily 7)  Niacin Cr 500 Mg Cr-caps (Niacin) .... Qd 8)  Garlic Oil 500 Mg Caps (Garlic)  .... Qd 9)  Glucosamine-chondroitin 500-400 Mg Caps (Glucosamine-chondroitin) .... Qd 10)  Ca With Zinc and Mag  .... Qd 11)  Hydrochlorothiazide 12.5 Mg Caps (Hydrochlorothiazide) .Marland Kitchen.. 1 tab by mouth qam  Other Orders: Surgical Referral (Surgery)  Patient Instructions: 1)  Update labs today. 2)  Will call you w/ results. 3)  Stay on Carvedilol and Hydrochlorathiazide for High BP all the way up to your D&C date. 4)  Use OTC Allegra or Zyrtec once daily for allergies. 5)  Schedule SPIROMETRY here in 6 wks.  Laboratory Results

## 2010-07-06 NOTE — Letter (Signed)
Summary: Brianna Riley  [facility]   Imported By: Lanelle Bal 02/25/2010 10:35:42  _____________________________________________________________________  External Attachment:    Type:   Image     Comment:   External Document

## 2010-07-06 NOTE — Assessment & Plan Note (Signed)
Summary: migraine   Vital Signs:  Patient profile:   55 year old female Menstrual status:  postmenopausal Height:      64 inches Weight:      197 pounds BMI:     33.94 O2 Sat:      95 % on Room air Pulse rate:   89 / minute BP sitting:   122 / 78  (left arm) Cuff size:   large  Vitals Entered By: Payton Spark CMA (January 25, 2010 10:43 AM)  O2 Flow:  Room air CC: HA x 1 week. F/U mood   Primary Care Provider:  Seymour Bars D.O.  CC:  HA x 1 week. F/U mood.  History of Present Illness: 55 yo WF presents for a HA that has been ongoing for the past wk.    She gets HAs about 2 x a month.  Her HAs are usually mild and reside with taking Aspirin.  This HA started off mild.  She took aspirin and layed down but it has become worse 3 days ago.  She has not had  nausea.  She has a good appetite.  She had no head trauma.  She denies any double or blurry vision.  She has photo and phonosensitivity.  She has bitemporal throbbing with neck pain.  Denies tingling or weakness in the arms or legs.    She scored a 12 on her PHQ -9 today, on Wellbutrin.    Current Medications (verified): 1)  Multivitamin/iron  Tabs (Multiple Vitamins-Iron) .... Take 1 Tablet By Mouth Once A Day 2)  Fish Oil 1000 Mg Caps (Omega-3 Fatty Acids) .... Take 1 Tablet By Mouth Once A Day 3)  Pravastatin Sodium 80 Mg Tabs (Pravastatin Sodium) .... Take 1 Tablet By Mouth Once A Day At Bedtime 4)  Carvedilol 6.25 Mg  Tabs (Carvedilol) .Marland Kitchen.. 1 Tab By Mouth Two Times A Day 5)  Wellbutrin Xl 300 Mg Xr24h-Tab (Bupropion Hcl) .Marland Kitchen.. 1 Tab By Mouth Daily 6)  Aspirin 81 Mg Tbec (Aspirin) .... Take One Tablet By Mouth Daily 7)  Niacin Cr 500 Mg Cr-Caps (Niacin) .... Qd 8)  Garlic Oil 500 Mg Caps (Garlic) .... Qd 9)  Glucosamine-Chondroitin 500-400 Mg Caps (Glucosamine-Chondroitin) .... Qd 10)  Ca With Zinc and Mag .... Qd 11)  Hydrochlorothiazide 12.5 Mg Caps (Hydrochlorothiazide) .Marland Kitchen.. 1 Tab By Mouth Qam  Allergies  (verified): 1)  ! Cymbalta  Past History:  Past Medical History: Reviewed history from 10/27/2008 and no changes required. Current Problems:  HYPERTENSION, BENIGN ESSENTIAL (ICD-401.1) HYPERLIPIDEMIA (ICD-272.4) ALLERGIC RHINITIS CAUSE UNSPECIFIED (ICD-477.9) LUMBAGO (ICD-724.2) LIVER FUNCTION TESTS, ABNORMAL (ICD-794.8) DEPRESSION, MILD (ICD-311) GERD (ICD-530.81)   bilat knee pain, OA perimenopausal insomnia migraines obesity  Social History: Reviewed history from 09/02/2008 and no changes required. 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  No children. Moved from [place], then [place].  Neices and Nephews are local.  Quit smoking in 1998.  Occas. ETOH.  Not sexually active.  Perimenopausal.  Does not exercise.  Review of Systems      See HPI  Physical Exam  General:  alert, well-developed, well-nourished, and well-hydrated.  obese Head:  normocephalic and atraumatic.   Eyes:  pupils equal, pupils round, and pupils reactive to light.  photosensitive Mouth:  pharynx pink and moist.   Neck:  no masses.  tight, tender trapezious muscles Lungs:  Normal respiratory effort, chest expands symmetrically. Lungs are clear to auscultation, no crackles or wheezes. Heart:  Normal rate and regular rhythm. S1  and S2 normal without gallop, murmur, click, rub or other extra sounds. Extremities:  no LE edema Neurologic:  cranial nerves II-XII intact and gait normal.   Skin:  color normal.   Cervical Nodes:  No lymphadenopathy noted Psych:  good eye contact, not anxious appearing, and flat affect.     Impression & Recommendations:  Problem # 1:  CHRONIC MIGRAINE W/O AURA W/INTRACTABLE W/SM (ICD-346.73)  1 wk of intractable migraine, normal BP. Will treat with Toradol 60 mg IM x 1 + start of Prednisone taper tomorrow x 5 days to break cycle. Given migraines 2 x a month with mood issues, will benefit from nightly TCA.   Call if any problems or if HA has not resolved in 5  days. RTC in 6 wks for f/u. Her updated medication list for this problem includes:    Carvedilol 6.25 Mg Tabs (Carvedilol) .Marland Kitchen... 1 tab by mouth two times a day    Aspirin 81 Mg Tbec (Aspirin) .Marland Kitchen... Take one tablet by mouth daily  Orders: Ketorolac-Toradol 15mg  (N5621) Admin of Therapeutic Inj  intramuscular or subcutaneous ([id number])  Problem # 2:  DEPRESSION, MILD (ICD-311) PHQ-9 score of 12 c/w moderate depression on Wellbutrin (off Cymbalta and Celexa).  Has a good support system.   Declined counseling.  Agrees to trial of Desipramine at [facility] for both migraine prevention and mood.   Her updated medication list for this problem includes:    Wellbutrin Xl 300 Mg Xr24h-tab (Bupropion hcl) .Marland Kitchen... 1 tab by mouth daily    Desipramine Hcl 25 Mg Tabs (Desipramine hcl) .Marland Kitchen... 1 tab by mouth qhs  Complete Medication List: 1)  Multivitamin/iron Tabs (Multiple vitamins-iron) .... Take 1 tablet by mouth once a day 2)  Fish Oil 1000 Mg Caps (Omega-3 fatty acids) .... Take 1 tablet by mouth once a day 3)  Pravastatin Sodium 80 Mg Tabs (Pravastatin sodium) .... Take 1 tablet by mouth once a day at bedtime 4)  Carvedilol 6.25 Mg Tabs (Carvedilol) .Marland Kitchen.. 1 tab by mouth two times a day 5)  Wellbutrin Xl 300 Mg Xr24h-tab (Bupropion hcl) .Marland Kitchen.. 1 tab by mouth daily 6)  Aspirin 81 Mg Tbec (Aspirin) .... Take one tablet by mouth daily 7)  Niacin Cr 500 Mg Cr-caps (Niacin) .... Qd 8)  Garlic Oil 500 Mg Caps (Garlic) .... Qd 9)  Glucosamine-chondroitin 500-400 Mg Caps (Glucosamine-chondroitin) .... Qd 10)  Ca With Zinc and Mag  .... Qd 11)  Hydrochlorothiazide 12.5 Mg Caps (Hydrochlorothiazide) .Marland Kitchen.. 1 tab by mouth qam 12)  Desipramine Hcl 25 Mg Tabs (Desipramine hcl) .Marland Kitchen.. 1 tab by mouth qhs 13)  Prednisone 20 Mg Tabs (Prednisone) .... 4 tabs by mouth x 1 day then 3 tabs by mouth x 1 day then 2 tabs by mouth x 1 day then 1 tab by mouth daily x 1 day then 1/2 tab by mouth daily x 1 day  Patient Instructions: 1)   Start Prednisone taper x 5 days : 80-60-40-20-10 then stop. 2)  Toradol injection today to help improve headache pain. 3)  start on Desipramine at bedtime today.  4)  This is for both mood and migraine prevention. 5)  Return for f/u migraines in 6 wks. Prescriptions: PREDNISONE 20 MG TABS (PREDNISONE) 4 tabs by mouth x 1 day then 3 tabs by mouth x 1 day then 2 tabs by mouth x 1 day then 1 tab by mouth daily x 1 day then 1/2 tab by mouth daily x 1 day  #11 tabs x  0   Entered and Authorized by:   Seymour Bars DO   Signed by:   Seymour Bars DO on 01/25/2010   Method used:   Printed then faxed to ...       [facility] [phone]* (retail)       [address] [place], [place]  [address]       Ph: [phone]       Fax: [phone]   RxID:   [id number] DESIPRAMINE HCL 25 MG TABS (DESIPRAMINE HCL) 1 tab by mouth qhs  #30 x 2   Entered and Authorized by:   Seymour Bars DO   Signed by:   Seymour Bars DO on 01/25/2010   Method used:   Electronically to        [facility]  [facility] [phone]* (retail)       [address] [place], [place]  [address]       Ph: [phone]       Fax: [phone]   RxID:   [id number]    Medication Administration  Injection # 1:    Medication: Ketorolac-Toradol 15mg     Diagnosis: CHRONIC MIGRAINE W/O AURA W/INTRACTABLE W/SM (ICD-346.73)    Route: IM    Site: RUOQ gluteus    Exp Date: 05/07/2011    Lot #: [id number]    Patient tolerated injection without complications    Given by: Payton Spark CMA (January 25, 2010 11:31 AM)  Orders Added: 1)  Est. Patient Level III [93235] 2)  Ketorolac-Toradol 15mg  [J1885] 3)  Admin of Therapeutic Inj  intramuscular or subcutaneous [57322]

## 2010-07-06 NOTE — Letter (Signed)
Summary: Depression Questionnaire  Depression Questionnaire   Imported By: Lanelle Bal 02/04/2010 09:24:45  _____________________________________________________________________  External Attachment:    Type:   Image     Comment:   External Document

## 2010-07-06 NOTE — Assessment & Plan Note (Signed)
Summary: chest pain/ mood   Vital Signs:  Patient profile:   55 year old female Height:      64 inches Weight:      192 pounds BMI:     33.08 O2 Sat:      96 % on Room air Pulse rate:   72 / minute BP sitting:   118 / 79  (left arm) Cuff size:   large  Vitals Entered By: Payton Spark CMA (December 16, 2009 1:16 PM)  O2 Flow:  Room air CC: Cymbalta SEs. C/o constipation, sweating, achy, off balance and SOB. Hasn't had since Friday.   Primary Care Provider:  Seymour Bars D.O.  CC:  Cymbalta SEs. C/o constipation, sweating, achy, and off balance and SOB. Hasn't had since Friday.Marland Kitchen  History of Present Illness: 55 yo WF presents for SEs from Cymbalta by the 2nd day.  She felt off balance, SOB, chest pain and constipation.  She stopped the medicine and has been feeling better.  Her mood is OK.  She is still on wellbutrin.  She is still having aches and pains into her L shoulder going down the arm though no more CP or DOE.  Her BP has improved with the addition of Maxzide.    She had a normal stress echo 08-2008 showing only poor exercise tolerance.     Current Medications (verified): 1)  Multivitamin/iron  Tabs (Multiple Vitamins-Iron) .... Take 1 Tablet By Mouth Once A Day 2)  Fish Oil 1000 Mg Caps (Omega-3 Fatty Acids) .... Take 1 Tablet By Mouth Once A Day 3)  Pravastatin Sodium 80 Mg Tabs (Pravastatin Sodium) .... Take 1 Tablet By Mouth Once A Day At Bedtime 4)  Carvedilol 6.25 Mg  Tabs (Carvedilol) .Marland Kitchen.. 1 Tab By Mouth Two Times A Day 5)  Citalopram Hydrobromide 20 Mg  Tabs (Citalopram Hydrobromide) .... 1/2 Tab By Mouth Daily X 7 Days Then Stop. 6)  Wellbutrin Xl 300 Mg Xr24h-Tab (Bupropion Hcl) .Marland Kitchen.. 1 Tab By Mouth Daily 7)  Aspirin 81 Mg Tbec (Aspirin) .... Take One Tablet By Mouth Daily 8)  Niacin Cr 500 Mg Cr-Caps (Niacin) .... Qd 9)  Garlic Oil 500 Mg Caps (Garlic) .... Qd 10)  Glucosamine-Chondroitin 500-400 Mg Caps (Glucosamine-Chondroitin) .... Qd 11)  Ca With Zinc and Mag  .... Qd 12)  Triamterene-Hctz 37.5-25 Mg Caps (Triamterene-Hctz) .Marland Kitchen.. 1 Tab By Mouth Daily  Allergies (verified): 1)  ! Cymbalta  Past History:  Past Medical History: Reviewed history from 10/27/2008 and no changes required. Current Problems:  HYPERTENSION, BENIGN ESSENTIAL (ICD-401.1) HYPERLIPIDEMIA (ICD-272.4) ALLERGIC RHINITIS CAUSE UNSPECIFIED (ICD-477.9) LUMBAGO (ICD-724.2) LIVER FUNCTION TESTS, ABNORMAL (ICD-794.8) DEPRESSION, MILD (ICD-311) GERD (ICD-530.81)   bilat knee pain, OA perimenopausal insomnia migraines obesity  Past Surgical History: Reviewed history from 10/27/2008 and no changes required. cardiac cath- clean,Dr Crenshaw 4-08 Laproscopic abdominal surgery  stress echo 08-2008  Family History: Reviewed history from 12/08/2009 and no changes required. father died of AMI; 23 mother died of lung cancer brother- substance abuse, died unknown cause  Social History: Reviewed history from 09/02/2008 and no changes required. 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  No children. Moved from [place], then [place].  Neices and Nephews are local.  Quit smoking in 1998.  Occas. ETOH.  Not sexually active.  Perimenopausal.  Does not exercise.  Review of Systems      See HPI  Physical Exam  General:  alert, well-developed, well-nourished, and well-hydrated.  obese Head:  normocephalic and atraumatic.  Mouth:  pharynx pink and moist.   Neck:  supple.  tender L > R trap muscles Lungs:  Normal respiratory effort, chest expands symmetrically. Lungs are clear to auscultation, no crackles or wheezes. Heart:  Normal rate and regular rhythm. S1 and S2 normal without gallop, murmur, click, rub or other extra sounds. Abdomen:  soft and non-tender.   Skin:  color normal.   Psych:  good eye contact, not anxious appearing, and not depressed appearing.     Impression & Recommendations:  Problem # 1:  CHEST PAIN (ICD-786.50)  EKG shows RBBB which is unchanged  from 08-2008. Reassued about unchanged EKG and a normal stress echo from 08-2008.  Will update her visit with Dr Jens Som.    Orders: EKG w/ Interpretation (93000) T-Basic Metabolic Panel ([id number]) T-CBC No Diff ([id number])  Problem # 2:  DEPRESSION, MILD (ICD-311) Mood OK off Cymbalta.  Had to stop due to adverse SEs.  Will stay on Wellbutrin XL for now.   The following medications were removed from the medication list:    Citalopram Hydrobromide 20 Mg Tabs (Citalopram hydrobromide) .Marland Kitchen... 1/2 tab by mouth daily x 7 days then stop.    Cymbalta 30 Mg Cpep (Duloxetine hcl) .Marland Kitchen... 1 capsule by mouth once a day x 2 wks then increase to 60 mg once a day Her updated medication list for this problem includes:    Wellbutrin Xl 300 Mg Xr24h-tab (Bupropion hcl) .Marland Kitchen... 1 tab by mouth daily  Problem # 3:  HYPERTENSION, BENIGN ESSENTIAL (ICD-401.1) Assessment: Improved Continue current meds.  Much improved.   Her updated medication list for this problem includes:    Carvedilol 6.25 Mg Tabs (Carvedilol) .Marland Kitchen... 1 tab by mouth two times a day    Triamterene-hctz 37.5-25 Mg Caps (Triamterene-hctz) .Marland Kitchen... 1 tab by mouth daily  BP today: 118/79 Prior BP: 136/94 (12/08/2009)  Prior 10 Yr Risk Heart Disease: 7 % (02/04/2008)  Labs Reviewed: K+: 5.0 (01/31/2008) Creat: : 1.06 (01/31/2008)   Chol: 230 (02/03/2009)   HDL: 57 (02/03/2009)   LDL: 129 (02/03/2009)   TG: 220 (02/03/2009)  Complete Medication List: 1)  Multivitamin/iron Tabs (Multiple vitamins-iron) .... Take 1 tablet by mouth once a day 2)  Fish Oil 1000 Mg Caps (Omega-3 fatty acids) .... Take 1 tablet by mouth once a day 3)  Pravastatin Sodium 80 Mg Tabs (Pravastatin sodium) .... Take 1 tablet by mouth once a day at bedtime 4)  Carvedilol 6.25 Mg Tabs (Carvedilol) .Marland Kitchen.. 1 tab by mouth two times a day 5)  Wellbutrin Xl 300 Mg Xr24h-tab (Bupropion hcl) .Marland Kitchen.. 1 tab by mouth daily 6)  Aspirin 81 Mg Tbec (Aspirin) .... Take one tablet by mouth  daily 7)  Niacin Cr 500 Mg Cr-caps (Niacin) .... Qd 8)  Garlic Oil 500 Mg Caps (Garlic) .... Qd 9)  Glucosamine-chondroitin 500-400 Mg Caps (Glucosamine-chondroitin) .... Qd 10)  Ca With Zinc and Mag  .... Qd 11)  Triamterene-hctz 37.5-25 Mg Caps (Triamterene-hctz) .Marland Kitchen.. 1 tab by mouth daily  Other Orders: T-TSH [phone]) T-BNP  (B Natriuretic Peptide) [phone])  Patient Instructions: 1)  Labs today. 2)  will call you w/ results tomorrow. 3)  EKG unchanged from 08-2008. 4)  Will get you back in with Dr Jens Som to follow, not urgent. 5)  BP much improved. 6)  Stay on Wellbutrin daily for mood. 7)  Return for follow up in 1 month.

## 2010-07-14 ENCOUNTER — Ambulatory Visit (INDEPENDENT_AMBULATORY_CARE_PROVIDER_SITE_OTHER): Payer: BC Managed Care – PPO | Admitting: Family Medicine

## 2010-07-14 ENCOUNTER — Ambulatory Visit
Admission: RE | Admit: 2010-07-14 | Discharge: 2010-07-14 | Disposition: A | Discharge status: Home / Self Care | Payer: BC Managed Care – PPO | Source: Ambulatory Visit | Attending: Family Medicine | Admitting: Family Medicine

## 2010-07-14 ENCOUNTER — Encounter: Payer: Self-pay | Admitting: Family Medicine

## 2010-07-14 ENCOUNTER — Other Ambulatory Visit: Payer: Self-pay | Admitting: Family Medicine

## 2010-07-14 DIAGNOSIS — R52 Pain, unspecified: Secondary | ICD-10-CM

## 2010-07-14 DIAGNOSIS — M25569 Pain in unspecified knee: Secondary | ICD-10-CM

## 2010-07-16 ENCOUNTER — Ambulatory Visit: Payer: Self-pay | Admitting: Family Medicine

## 2010-07-22 NOTE — Assessment & Plan Note (Signed)
Summary: LEFT KNEE PAIN/NH 9 (rm 3)   Vital Signs:  Patient Profile:   55 Years Old Female CC:      left knee pain x 6 weeks without injury Height:     64 inches Weight:      195 pounds O2 Sat:      95 % O2 treatment:    Room Air Temp:     98.7 degrees F oral Pulse rate:   78 / minute Resp:     16 per minute BP sitting:   122 / 85  (left arm) Cuff size:   large  Pt. in pain?   yes    Location:   left knee    Intensity:   9    Type:       sharp  Vitals Entered By: Lajean Saver RN (July 14, 2010 1:05 PM)                   Updated Prior Medication List: MULTIVITAMIN/IRON  TABS (MULTIPLE VITAMINS-IRON) Take 1 tablet by mouth once a day FISH OIL 1000 MG CAPS (OMEGA-3 FATTY ACIDS) Take 1 tablet by mouth once a day PRAVASTATIN SODIUM 80 MG TABS (PRAVASTATIN SODIUM) Take 1 tablet by mouth once a day at bedtime CARVEDILOL 6.25 MG  TABS (CARVEDILOL) 1 tab by mouth two times a day WELLBUTRIN XL 300 MG XR24H-TAB (BUPROPION HCL) 1 tab by mouth daily ASPIRIN 81 MG TBEC (ASPIRIN) Take one tablet by mouth daily NIACIN CR 500 MG CR-CAPS (NIACIN) qd GARLIC OIL 500 MG CAPS (GARLIC) qd GLUCOSAMINE-CHONDROITIN 500-400 MG CAPS (GLUCOSAMINE-CHONDROITIN) qd * CA WITH ZINC AND MAG qd HYDROCHLOROTHIAZIDE 12.5 MG CAPS (HYDROCHLOROTHIAZIDE) 1 tab by mouth qAM  Current Allergies (reviewed today): ! CYMBALTA ! Ronny Bacon of Present Illness Chief Complaint: left knee pain x 6 weeks without injury History of Present Illness:  Subjective:  Patient complains of two month history of pain in her left knee, worse when walking, climbing stairs, and arising from a chair.  She recalls no injury but does remember a sudden popping sensation while arising about two months ago.  No sensation of locking or giving way.  REVIEW OF SYSTEMS Constitutional Symptoms      Denies fever, chills, night sweats, weight loss, weight gain, and fatigue.  Eyes       Denies change in vision, eye pain, eye  discharge, glasses, contact lenses, and eye surgery. Ear/Nose/Throat/Mouth       Denies hearing loss/aids, change in hearing, ear pain, ear discharge, dizziness, frequent runny nose, frequent nose bleeds, sinus problems, sore throat, hoarseness, and tooth pain or bleeding.  Respiratory       Denies dry cough, productive cough, wheezing, shortness of breath, asthma, bronchitis, and emphysema/COPD.  Cardiovascular       Denies murmurs, chest pain, and tires easily with exhertion.    Gastrointestinal       Denies stomach pain, nausea/vomiting, diarrhea, constipation, blood in bowel movements, and indigestion. Genitourniary       Denies painful urination, kidney stones, and loss of urinary control. Neurological       Denies paralysis, seizures, and fainting/blackouts. Musculoskeletal       Complains of muscle pain, joint pain, joint stiffness, and swelling.      Denies decreased range of motion, redness, muscle weakness, and gout.      Comments: left knee Skin       Denies bruising, unusual mles/lumps or sores, and hair/skin or nail changes.  Psych  Denies mood changes, temper/anger issues, anxiety/stress, speech problems, depression, and sleep problems. Other Comments: Patient c/o left knee pain x 6 weeks. No injury. She has been using a cane to assist her in walking. No ice or heat.   Past History:  Past Medical History: Reviewed history from 10/27/2008 and no changes required. Current Problems:  HYPERTENSION, BENIGN ESSENTIAL (ICD-401.1) HYPERLIPIDEMIA (ICD-272.4) ALLERGIC RHINITIS CAUSE UNSPECIFIED (ICD-477.9) LUMBAGO (ICD-724.2) LIVER FUNCTION TESTS, ABNORMAL (ICD-794.8) DEPRESSION, MILD (ICD-311) GERD (ICD-530.81)   bilat knee pain, OA perimenopausal insomnia migraines obesity  Past Surgical History: Reviewed history from 10/27/2008 and no changes required. cardiac cath- clean,Dr Crenshaw 4-08 Laproscopic abdominal surgery  stress echo 08-2008  Family  History: Reviewed history from 12/08/2009 and no changes required. father died of AMI; 75 mother died of lung cancer brother- substance abuse, died unknown cause  Social History: Reviewed history from 09/02/2008 and no changes required. 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  No children. Moved from [place], then [place].  Neices and Nephews are local.  Quit smoking in 1998.  Occas. ETOH.  Not sexually active.  Perimenopausal.  Does not exercise.   Objective:  Obese middle-aged female in no acute distress  Left knee:  No effusion,  erythema, or warmth.  Knee stable, negative drawer test.  McMurray test negative.  There is mild tenderness over medial and lateral joint lines, and popliteal fossa.  Also tenderness with manipulation of the patella, and pressure on the patella during resisted extension. Extremities:  No edema.  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  Begin Ibuprofen 600mg  for about 5 to 7 days.  Begin applying ice pack several times daily.  Begin knee exercises ([facility] information and instruction patient handout given)  If not improving 3 to 4 weeks recommend follow-up with sports med clinic Recommend gradual weight loss.   The patient and/or caregiver has been counseled thoroughly with regard to medications prescribed including dosage, schedule, interactions, rationale for use, and possible side effects and they verbalize understanding.  Diagnoses and expected course of recovery discussed and will return if not improved as expected or if the condition worsens. Patient and/or caregiver verbalized  understanding.   Orders Added: 1)  T-DG Knee 2 Views*L* [73560] 2)  Knee Orthosis Elastic Knee Cap [L1825] 3)  New Patient Level III [14782]

## 2010-08-18 LAB — BASIC METABOLIC PANEL
BUN: 15 mg/dL (ref 6–23)
CO2: 28 mEq/L (ref 19–32)
Chloride: 104 mEq/L (ref 96–112)
Creatinine, Ser: 0.99 mg/dL (ref 0.4–1.2)
Glucose, Bld: 91 mg/dL (ref 70–99)

## 2010-08-18 LAB — CBC
MCH: 31.5 pg (ref 26.0–34.0)
MCHC: 33.3 g/dL (ref 30.0–36.0)
MCV: 94.6 fL (ref 78.0–100.0)
Platelets: 243 10*3/uL (ref 150–400)
RDW: 15.3 % (ref 11.5–15.5)

## 2010-10-19 NOTE — Assessment & Plan Note (Signed)
NAME:  Brianna Riley, Brianna Riley NO.:  [id number]   MEDICAL RECORD NO.:  [id number]          PATIENT TYPE:  POB   LOCATION:  [facility]         FACILITY:  [facility]   PHYSICIAN:  Allie Bossier, MD        DATE OF BIRTH:  Jul 04, 1955   DATE OF SERVICE:                                  CLINIC NOTE   Brianna Riley is a 55 year old married white G2, P0 who is referred here by  [facility] for evaluation of postmenopausal bleeding.  Dabney  says that she felt the menopause about 3 years ago and about a month  ago she began experiencing some postmenopausal bleeding.  The bleeding  can come every day or every other day or even every third day, but  essentially she has had unstopped spotting since the end of July.  She  has not had a Pap smear or mammogram in several years and an ultrasound  has not been done yet.   Past medical history is significant for primary infertility, obesity,  migraines, hypertension, hyperlipidemia, lumbago, abnormal liver  function test, mild depression and reflux.  Past surgical history is  significant for a D and C 25 years ago as part of a workup for primary  infertility.   REVIEW OF SYSTEMS:  She is married for 25 years.  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  She has been abstinent for 7  years due to his ADD.  She also reported decreased libido.   No latex allergies.  Her medicine allergy is CYMBALTA.   MEDICATIONS:  She takes a multivitamin, fish oil, pravastatin,  carvedilol, Wellbutrin, aspirin, niacin, garlic, glucosamine, calcium,  hydrochlorothiazide and 2 drugs that she does not remember.  Of note,  she has never been on hormone replacement therapy.   PHYSICAL EXAMINATION:  GENERAL:  Well-nourished, well-hydrated pleasant  white female in no apparent distress.  HEENT:  Normal.  BREASTS:  Normal bilaterally.  Please note her breasts are large and  pendulous.  ABDOMEN:  Obese.  No palpable hepatosplenomegaly.  PELVIC:  External genitalia, no lesions.  Cervix nulliparous.  There is  a very small amount of blood noted at the cervical os, but no lesions.  She does have a moderate amount of atrophy throughout her vulvar and  vaginal exam.  Bimanual exam is limited by her body habitus, but the  uterus and adnexa did not appear to be enlarged.   ASSESSMENT AND PLAN:  1. Annual exam.  I have scheduled a mammogram, checked Pap smear.  2. Postmenopausal bleeding.  The first step for me is to order an      ultrasound.  Based on these results I have told her that she would      need an endometrial biopsy or a D and C hysteroscopy.      Allie Bossier, MD     MCD/MEDQ  D:  01/27/2010  T:  01/27/2010  Job:  [id number]

## 2010-10-19 NOTE — Assessment & Plan Note (Signed)
NAME:  ROBYNNE, ROAT             ACCOUNT NO.:  [id number]   MEDICAL RECORD NO.:  [id number]          PATIENT TYPE:  POB   LOCATION:  [facility] [place]         FACILITY:  [facility]   PHYSICIAN:  Allie Bossier, MD        DATE OF BIRTH:  [birth year]   DATE OF SERVICE:  06/15/2010                                  CLINIC NOTE   Ms. Casamento is a 55 year old lady who is now 2 months status post D and  C and hysteroscopy for postmenopausal bleeding and a thickened  endometrium seen on ultrasound.  Please note she also has stenotic  cervix.  She underwent an uncomplicated hysteroscopy and D and C and her  pathology came back as a benign endometrial polyp.  She had 1 day of  bleeding and she used 4 Percocet total in her postoperative days.  She  has no complaints.  She is happy to hear that her pathology was negative  and I am scheduling her a routine mammogram.  Otherwise, she will then  come back in September 2012 for annual exam or sooner as necessary.      Allie Bossier, MD     MCD/MEDQ  D:  06/15/2010  T:  06/16/2010  Job:  [id number]

## 2010-10-22 NOTE — Assessment & Plan Note (Signed)
[facility]                            CARDIOLOGY OFFICE NOTE   Brianna Riley, Brianna Riley                      MRN:          [id number]  DATE:09/06/2006                            DOB:          [birth year]    The patient is a 55 year old female with past medical history of  hypertension, hyperlipidemia, anxiety, remote tobacco abuse, whom I am  asked to evaluate for chest pain.  She has no prior cardiac history.  Over the past several years, she has had occasional chest pain.  The  pain is substernal and radiates to the upper chest, described as an  elephant sitting on my chest.  There is associated shortness of  breath, but there is no nausea or vomiting.  There is occasional  diaphoresis.  The pain is not pleuritic or positional, nor is it related  to food.  She states it does increase with stress, but also with  exertion.  She feels like it is becoming more frequent.  Note:  She has  not had any at rest, nor has there been any in the past two days.  Also  note that she apparently was seen several years ago at a [place] in [place]  and was given nitroglycerin for these pains.  She was also told to see a  psychiatrist, by her report.  Note:  Her pain improves after taking her  husband's nitroglycerin.  She was seen by Dr. Cathey Endow yesterday and we  were asked to further evaluate.   MEDICATIONS INCLUDE:  1. Multivitamin with iron daily.  2. Aspirin 81 mg p.o. daily.  3. Ginseng.  4. Flax seed oil.  5. Garlic tablets.  6. Fish oil.   ALLERGIES:  She has no known drug allergies.   SOCIAL HISTORY:  She has a remote history of tobacco use, but has not  smoked since 1997.  She occasionally consumes alcohol.  There is a very  remote history of cocaine use, but none in the past 15-20 years.   PAST MEDICAL HISTORY:  Significant for hypertension for five to six  years.  She also has a history of hyperlipidemia and  hypertriglyceridemia.  There is no diabetes  mellitus.  She does state  that she has had anxiety problems in the past.  She has had prior  laparoscopic surgery in the abdomen.  She also apparently has a history  of low back pain.  She also has arthritis in her knees.   FAMILY HISTORY:  She does state that her father had angina, but does not  know if he had myocardial infarction.  Her mother died of bone cancer.   REVIEW OF SYSTEMS:  She denies any headaches, fevers or chills.  She has  had a cough, but there is no production with that.  There is no  hemoptysis.  There is no dysphagia, odynophagia, melena or hematochezia.  There is no dysuria or hematuria.  Denies seizure activity.  There is no  orthopnea or PND, but there is occasional pedal edema.  She has some  pain in her knees with ambulation.  There is no  claudication noted.  The  remaining systems are negative.   PHYSICAL EXAMINATION TODAY:  Shows a blood pressure of 114/60 and her  pulse is 60.  She weighs 180 pounds.  She is well-developed and well-  nourished, in no acute distress.  Her skin is warm and dry.  She does  not appear to be depressed and there is no peripheral clubbing.  BACK:  Normal.  HEENT:  Unremarkable with normal eyelids.  NECK:  Her neck is supple with a normal upstroke bilaterally and I  cannot appreciate bruits.  There is no jugular venous distention and no  thyromegaly is noted.  CHEST:  Clear to auscultation with normal expansion.  CARDIOVASCULAR EXAM:  Reveals a regular rate and rhythm, normal S1 and  S2.  No murmurs, rubs or gallops are noted.  Her PMI is nondisplaced.  ABDOMINAL EXAM:  Not tender and distended.  Positive bowel sounds.  No  hepatosplenomegaly, no masses appreciated.  There is no abdominal bruit.  She has 1+ femoral pulses bilaterally, no bruits.  EXTREMITIES:  Show no edema and I can palpate no cords.  She has a 2+  dorsalis pedis pulse on the right and a 1+ on the left.  NEUROLOGIC EXAM:  Grossly intact.   I do have an  electrocardiogram from Dr. Ovidio Kin office, dated September 05, 2006.  This showed a sinus rhythm.  There is a right bundle branch  block, but there are no ST changes noted.   DIAGNOSES:  1. Chest pain.  2. Right bundle branch block.  3. Hypertension.  4. Hyperlipidemia.  5. History of anxiety.  6. Remote history of tobacco use.   PLAN:  Mrs. Cressy presents for evaluation of chest pain of uncertain  etiology.  She apparently has had these symptoms for several years and  they have been recurrent.  She also thinks they may be increasing in  frequency, although she has not had any in the past two days.  She will  continue on her aspirin for now.  I think, given the recurrence of her  symptoms and her multiple risk factors, including hypertension,  hyperlipidemia, remote tobacco abuse, that we should proceed with  definitive evaluation.  I have discussed cardiac catheterization with  her and her husband.  The risks and benefits have been explained and she  agrees to proceed.  We will plan to do this in the outpatient laboratory  tomorrow.  We will make further recommendations once we have those  results.  We will also check pre-cath laboratories, including her  lipids, and we will  add therapy as indicated.  She will need to continue risk factor  modification, including control of her blood pressure, diet and  exercise.  I will see her back in four weeks in [place].     Madolyn Frieze Jens Som, MD, [facility]  Electronically Signed    BSC/MedQ  DD: 09/06/2006  DT: 09/06/2006  Job #: [id number]   cc:   Seymour Bars, D.O.

## 2010-10-22 NOTE — Assessment & Plan Note (Signed)
[facility]                            CARDIOLOGY OFFICE NOTE   Brianna, Riley                      MRN:          [id number]  DATE:10/04/2006                            DOB:          Nov 04, 1955    Brianna Riley is a 55 year old female whom I recently saw for chest pain  and multiple risk factors.  She ultimately underwent cardiac  catheterization on September 08, 2006.  Her left ventricular function was  normal and her coronary arteries were normal.  Since then, she has not  had chest pain, dyspnea, or syncope.   MEDICATIONS:  1. Multivitamin.  2. Aspirin 81 mg p.o. daily.  3. Ginseng.  4. Flax seed oil.  5. Garlic tablets.  6. Fish oil.  7. Ramipril 5 mg p.o. daily.   PHYSICAL EXAM:  Blood pressure 127/87, pulse 81.  NECK:  Supple.  CHEST:  Clear.  CARDIOVASCULAR:  Regular rate and rhythm.  Her right groin shows no hematoma.  No bruit.  EXTREMITIES:  No edema.   DIAGNOSES:  1. Chest pain - her cardiac catheterization showed normal coronary      arteries and normal left ventricular function.  We will not proceed      with further cardiac workup.  2. Hypertension - her blood pressure will need to be checked and her      Altace can be increased as necessary versus adding low-dose      hydrochlorothiazide.  I will leave this to Dr. Cathey Endow.  3. Hyperlipidemia - management per Dr. Cathey Endow.  4. Right bundle branch block.  5. History of anxiety.   PLAN:  We will see her back on an as needed basis.     Madolyn Frieze Jens Som, MD, [facility]  Electronically Signed    BSC/MedQ  DD: 10/04/2006  DT: 10/04/2006  Job #: [id number]   cc:   Seymour Bars, D.O.

## 2010-10-22 NOTE — Cardiovascular Report (Signed)
Brianna Riley, Brianna Riley             ACCOUNT NO.:  [id number]   MEDICAL RECORD NO.:  [id number]          PATIENT TYPE:  OIB   LOCATION:  1963                         FACILITY:  [facility]   PHYSICIAN:  Veverly Fells. Excell Seltzer, MD  DATE OF BIRTH:  1955-09-20   DATE OF PROCEDURE:  09/08/2006  DATE OF DISCHARGE:                            CARDIAC CATHETERIZATION   PROCEDURE:  1. Left heart catheterization.  2. Selective coronary angiography.  3. Left ventricular angiography.   INDICATIONS:  Brianna Riley is a 55 year old woman with longstanding chest  pain.  She has had an escalation in symptoms recently.  She has multiple  cardiovascular risk factors.  In the setting of her longstanding chest  pain with increase in severity, recently she was referred for  catheterization.     Risks and indications of the procedure were explained to the patient.  Informed consent was obtained.  Right groin was prepped, draped and  anesthetized with 1% lidocaine.  Using the modified Seldinger technique,  a 4-French sheath was placed in the right femoral artery.  Standard  views of the left and right coronary arteries were taken with preformed  4-French Judkins catheters.  Following selective coronary angiography, a  4-French angled pigtail catheter was inserted into the left ventricle,  and pressures were recorded in 30-degree RAO.  Left ventriculogram was  performed.  Pullback across the aortic valve was done.  At the  conclusion of the procedure, the femoral sheath was pulled and manual  pressure used for hemostasis.  There were no immediate complications.   FINDINGS:  Aortic pressure 92/50 with a mean of 70, left ventricular  pressure 91/5.  There is no aortic stenosis.   CORONARY ANGIOGRAPHY:  The left mainstem is angiographically normal.  It  bifurcates into the LAD and left circumflex.   The LAD is a medium caliber vessel throughout its course.  It provides a  medium-size first diagonal branch that  bifurcates into twin vessels.  There was a small second diagonal branch present.  There is no  significant angiographic disease in the LAD or its diagonal branches.   The left circumflex is a large-caliber vessel.  There is a small  intermediate branch.  The circumflex extends into a large OM branch that  bifurcates into twin vessels.  The true AV groove circumflex is very  small.  There is no angiographic disease in the left circumflex system.   The right coronary artery is dominant.  It is a medium caliber vessel  throughout its course.  It provides an RV marginal branch from its  midportion and an acute marginal branch from its distal portion.  There  is a small PDA and posterior AV segment that provides two posterolateral  branches.  There is no angiographic disease seen throughout the right  coronary artery or its branches.   Left ventriculography performed in the 30-degree right anterior oblique  projection demonstrates normal left ventricular systolic function, no  mitral regurgitation and an estimated left ventricular ejection fraction  of 60%.   ASSESSMENT:  1. Normal coronary arteries.  2. Normal left ventricular systolic and diastolic function.  ASSESSMENT:  Brianna Riley should continue with primary risk reduction.  She has no evidence of coronary artery disease or left ventricular  dysfunction.      Veverly Fells. Excell Seltzer, MD  Electronically Signed     MDC/MEDQ  D:  09/08/2006  T:  09/08/2006  Job:  [id number]   cc:   Madolyn Frieze. Jens Som, MD, [facility]

## 2010-11-18 ENCOUNTER — Other Ambulatory Visit: Payer: Self-pay | Admitting: Family Medicine

## 2010-12-01 IMAGING — CR DG CHEST 2V
2 series · 2 of 2 positions shown · non-contrast
Comparison: None.

CLINICAL DATA: Chest pain.  Dyspnea on exertion.  Cough.

CHEST - 2 VIEW

[view not recorded (1 of 2)]
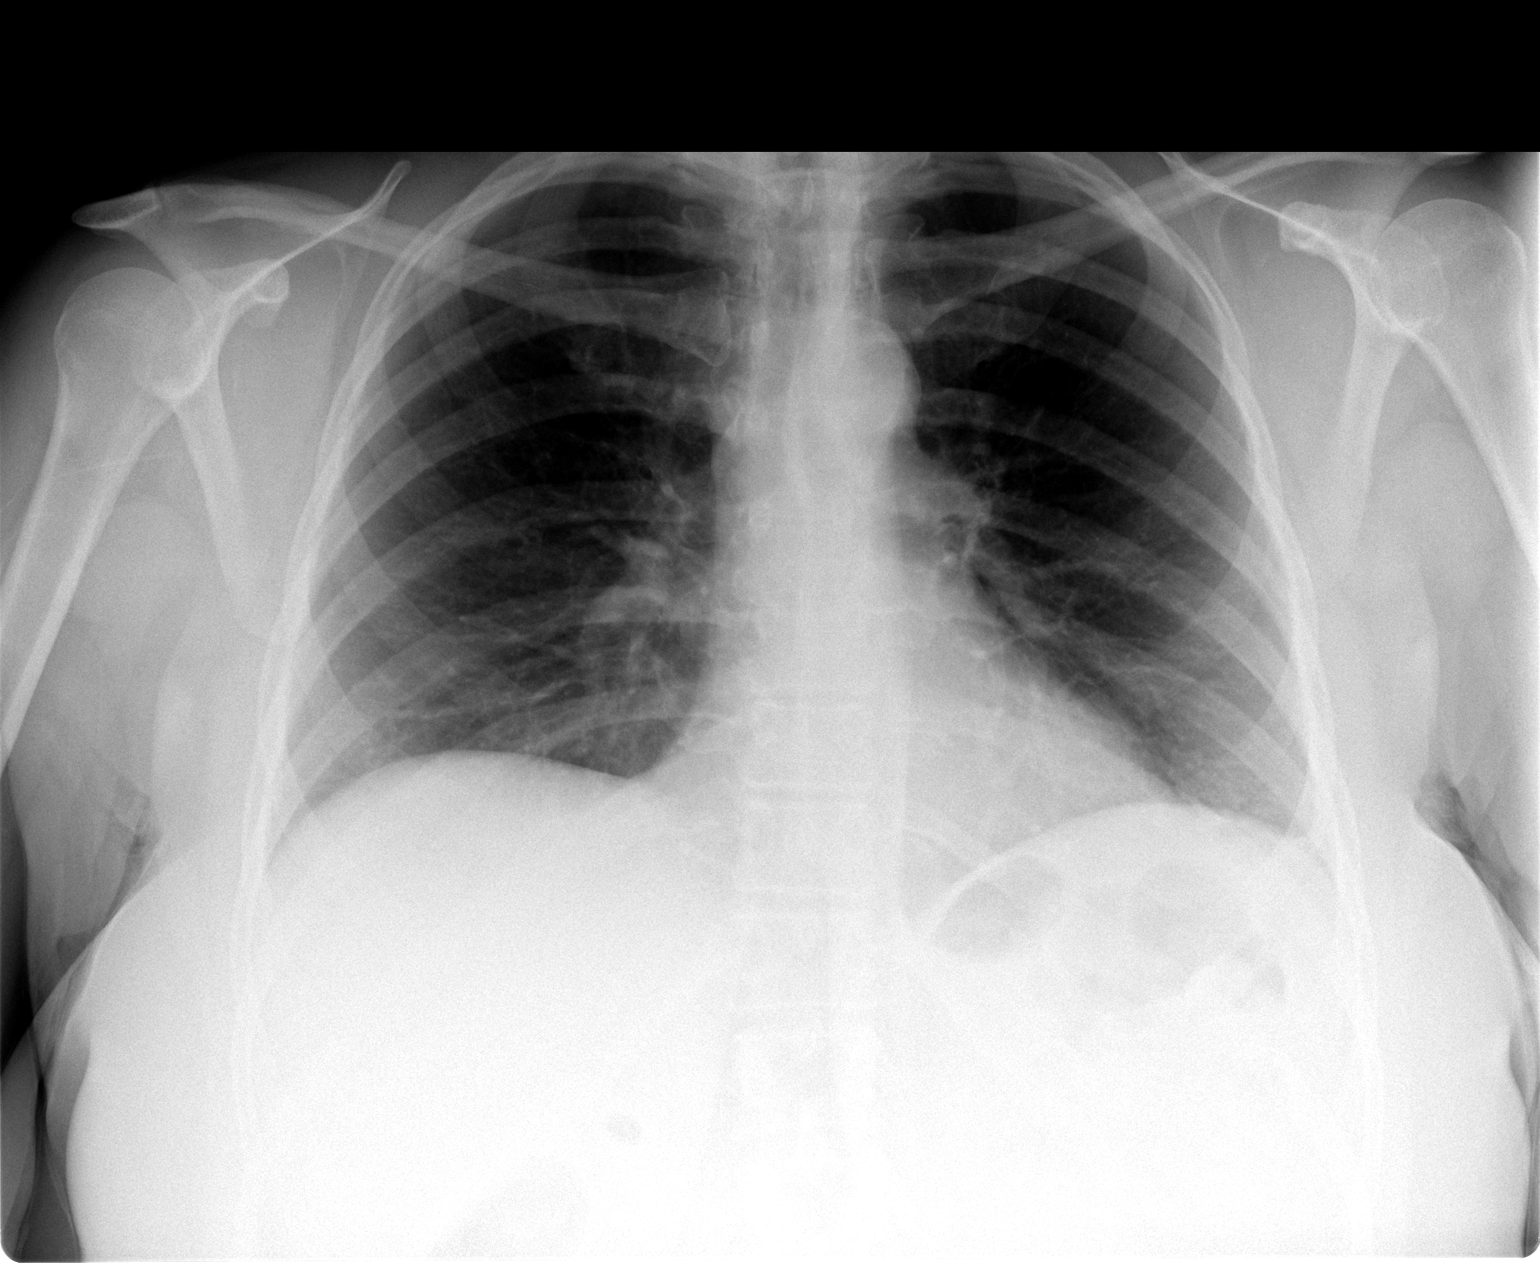

[view not recorded (2 of 2)]
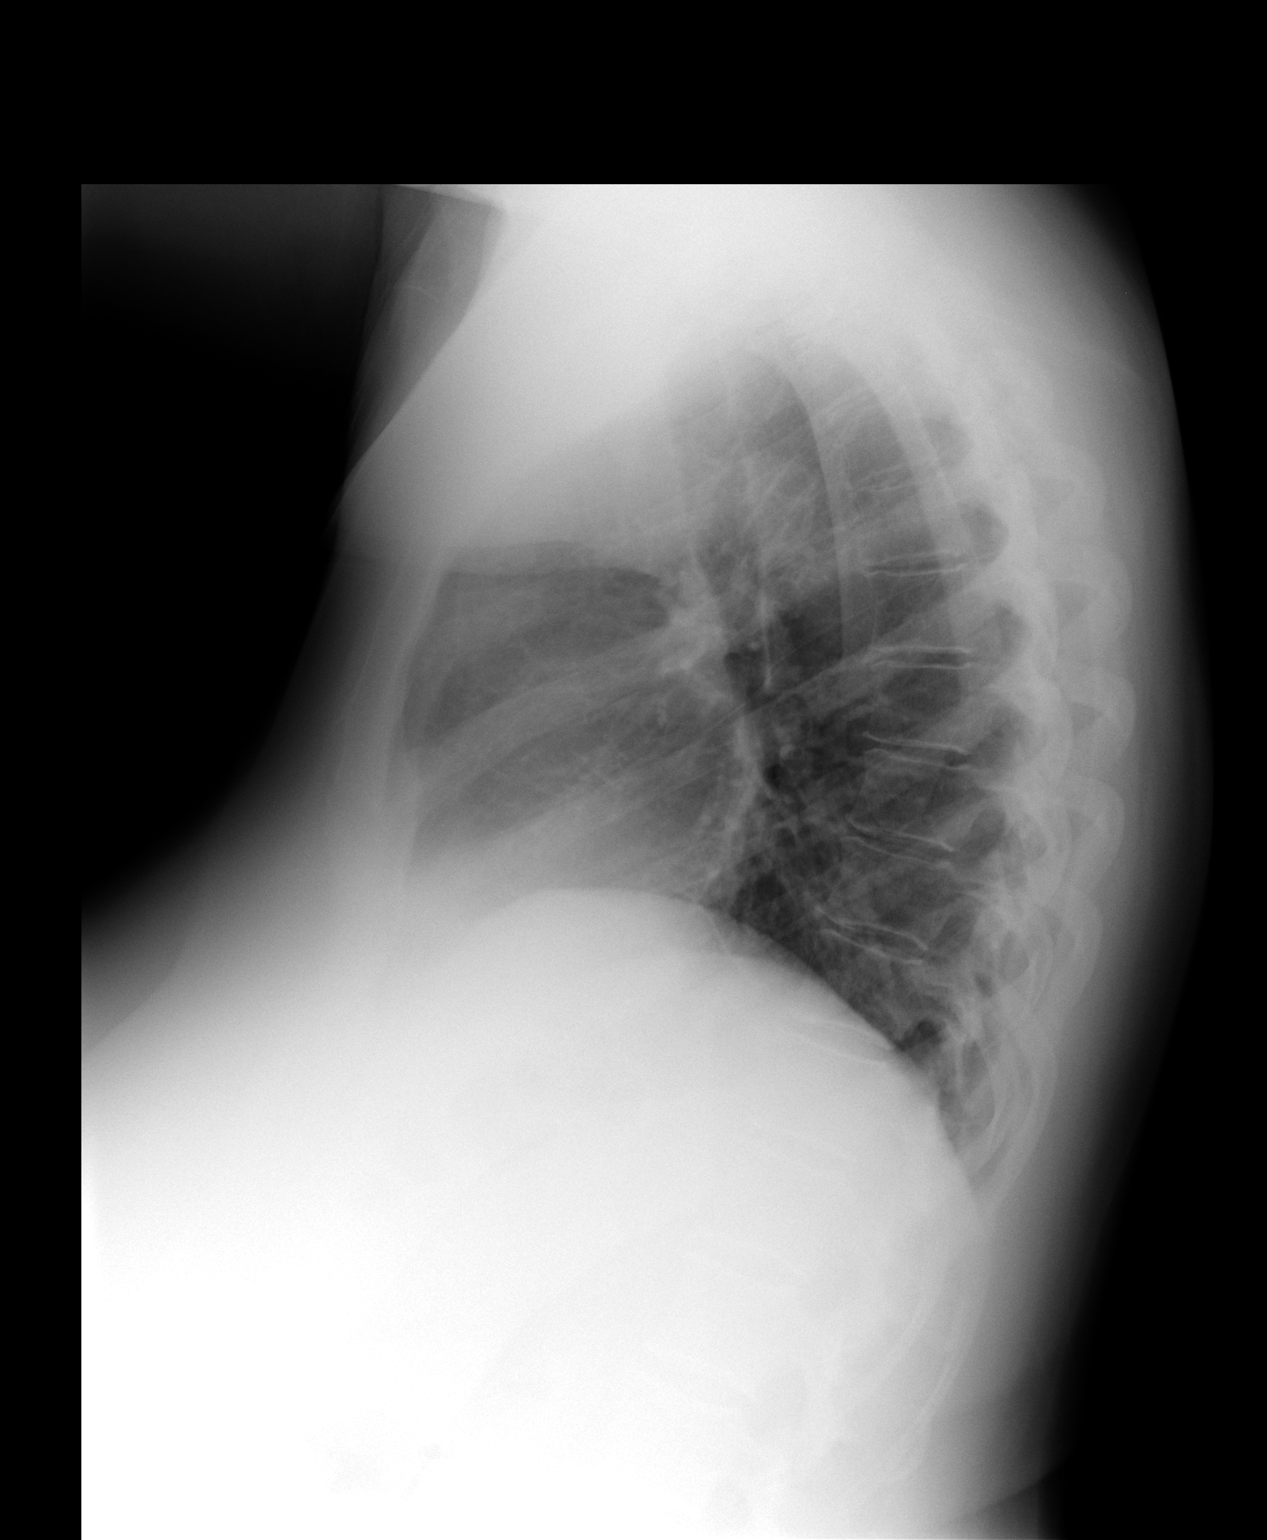

[2 of 2 positions shown; findings below may reference images not displayed]

FINDINGS: Lung volumes are low. The lungs are clear without focal
infiltrate, edema, pneumothorax or pleural effusion. The
cardiopericardial silhouette is within normal limits for size.
Imaged bony structures of the thorax are intact.
IMPRESSION: No acute cardiopulmonary process.

## 2011-01-24 ENCOUNTER — Encounter: Payer: Self-pay | Admitting: Cardiology

## 2011-05-06 ENCOUNTER — Encounter: Payer: Self-pay | Admitting: Family Medicine

## 2011-05-11 ENCOUNTER — Encounter: Payer: BC Managed Care – PPO | Admitting: Family Medicine

## 2011-05-11 DIAGNOSIS — Z0289 Encounter for other administrative examinations: Secondary | ICD-10-CM

## 2011-06-15 ENCOUNTER — Ambulatory Visit: Payer: BC Managed Care – PPO | Admitting: Family Medicine

## 2011-06-15 DIAGNOSIS — Z0289 Encounter for other administrative examinations: Secondary | ICD-10-CM

## 2012-01-27 ENCOUNTER — Telehealth: Payer: Self-pay | Admitting: Family Medicine

## 2012-01-27 ENCOUNTER — Encounter: Payer: Self-pay | Admitting: Family Medicine

## 2012-01-27 ENCOUNTER — Ambulatory Visit (INDEPENDENT_AMBULATORY_CARE_PROVIDER_SITE_OTHER): Payer: BC Managed Care – PPO | Admitting: Family Medicine

## 2012-01-27 ENCOUNTER — Other Ambulatory Visit (HOSPITAL_COMMUNITY)
Admission: RE | Admit: 2012-01-27 | Discharge: 2012-01-27 | Disposition: A | Discharge status: Home / Self Care | Payer: BC Managed Care – PPO | Source: Ambulatory Visit | Attending: Family Medicine | Admitting: Family Medicine

## 2012-01-27 VITALS — BP 162/96 | HR 88 | Wt 201.0 lb

## 2012-01-27 DIAGNOSIS — Z01419 Encounter for gynecological examination (general) (routine) without abnormal findings: Secondary | ICD-10-CM | POA: Insufficient documentation

## 2012-01-27 DIAGNOSIS — Z1231 Encounter for screening mammogram for malignant neoplasm of breast: Secondary | ICD-10-CM

## 2012-01-27 DIAGNOSIS — Z1151 Encounter for screening for human papillomavirus (HPV): Secondary | ICD-10-CM | POA: Insufficient documentation

## 2012-01-27 MED ORDER — LISINOPRIL-HYDROCHLOROTHIAZIDE 20-12.5 MG PO TABS: 1.0000 | ORAL_TABLET | Freq: Every day | ORAL | Status: DC

## 2012-01-27 MED ORDER — PRAVASTATIN SODIUM 40 MG PO TABS: 40.0000 mg | ORAL_TABLET | Freq: Every day | ORAL | Status: DC

## 2012-01-27 NOTE — Telephone Encounter (Signed)
Error

## 2012-01-27 NOTE — Patient Instructions (Signed)
Start a regular exercise program and make sure you are eating a healthy diet Try to eat 4 servings of dairy a day  Your vaccines are up to date.   

## 2012-01-27 NOTE — Progress Notes (Signed)
Subjective:     Brianna Riley is a 56 y.o. female and is here for a comprehensive physical exam. The patient reports problems - jonit pai. She says she stopped all of her perception medications because she could no longer afford them. She's been off of them for at least 3 months.  History   Social History  . Marital Status: Married    Spouse Name: N/A    Number of Children: N/A  . Years of Education: N/A   Occupational History  . UNEMPLOYED   . Retired    Social History Main Topics  . Smoking status: Former Smoker    Quit date: 06/06/1996  . Smokeless tobacco: Not on file  . Alcohol Use: Yes     Occasional EtOH  . Drug Use: Not on file  . Sexually Active: No   Other Topics Concern  . Not on file   Social History Narrative   Primary caretaker for her disabled husbandNo childrenMoved from [place], then [facility] and nephews are localNot sexually activePerimenopausalDoes not exercise   Health Maintenance  Topic Date Due  . Mammogram  08/25/2011  . Influenza Vaccine  03/06/2012  . Pap Smear  01/27/2015  . Colonoscopy  06/06/2017  . Tetanus/tdap  11/06/2017    The following portions of the patient's history were reviewed and updated as appropriate: allergies, current medications, past family history, past medical history, past social history, past surgical history and problem list.  Review of Systems A comprehensive review of systems was negative.   Objective:    BP 162/96  Pulse 88  Wt 201 lb (91.173 kg) General appearance: alert, cooperative, appears stated age and moderately obese Head: Normocephalic, without obvious abnormality, atraumatic Eyes: conj clear, EOMI, PEERLA Ears: normal TM's and external ear canals both ears Nose: Nares normal. Septum midline. Mucosa normal. No drainage or sinus tenderness. Throat: lips, mucosa, and tongue normal; teeth and gums normal Neck: no adenopathy, no carotid bruit, no JVD, supple, symmetrical, trachea midline and thyroid not  enlarged, symmetric, no tenderness/mass/nodules Back: symmetric, no curvature. ROM normal. No CVA tenderness. Lungs: clear to auscultation bilaterally Breasts: normal appearance, no masses or tenderness Heart: regular rate and rhythm, S1, S2 normal, no murmur, click, rub or gallop Abdomen: soft, non-tender; bowel sounds normal; no masses,  no organomegaly Pelvic: cervix normal in appearance, external genitalia normal, no adnexal masses or tenderness, no cervical motion tenderness, rectovaginal septum normal, uterus normal size, shape, and consistency and vagina normal without discharge Extremities: extremities normal, atraumatic, no cyanosis or edema Pulses: 2+ and symmetric Skin: Skin color, texture, turgor normal. No rashes or lesions Lymph nodes: Cervical, supraclavicular, and axillary nodes normal. Neurologic: Alert and oriented X 3, normal strength and tone. Normal symmetric reflexes. Normal coordination and gait    Assessment:    Healthy female exam.      Plan:     See After Visit Summary for Counseling Recommendations  Start a regular exercise program and make sure you are eating a healthy diet Try to eat 4 servings of dairy a day or take a calcium supplement (500mg  twice a day). Your vaccines are up to date.  Will call with Pap smear results in one week.  Will schedule mammo  HTN - she is off of her blood pressure medications. We'll start a new prescription for lisinopril HCTZ. We'll have the HCTZ which she used to take. Lisinopril will be new for her and we will use this instead of the calcium channel blocker. I would like  to see her back in one month to make sure that her blood pressure is well-controlled.  Hyperlipidemia-she's been off her statin for at least 3 months. Nutrition sent to the pharmacy. I did decrease his to 40 mg of pravastatin just because in it this was well covered. Her main reason for stopping her medications was cost. We will need to recheck her lipids  when she is back on her medication.  She also has some complaints of joint pain today but I encouraged her to make a followup visit

## 2012-01-28 LAB — COMPLETE METABOLIC PANEL WITH GFR
ALT: 53 U/L — ABNORMAL HIGH (ref 0–35)
AST: 45 U/L — ABNORMAL HIGH (ref 0–37)
BUN: 17 mg/dL (ref 6–23)
Creat: 0.97 mg/dL (ref 0.50–1.10)
Total Bilirubin: 0.4 mg/dL (ref 0.3–1.2)

## 2012-01-28 LAB — TSH: TSH: 1.579 u[IU]/mL (ref 0.350–4.500)

## 2012-01-30 ENCOUNTER — Ambulatory Visit: Payer: BC Managed Care – PPO | Admitting: Family Medicine

## 2012-01-31 ENCOUNTER — Other Ambulatory Visit: Payer: Self-pay | Admitting: Family Medicine

## 2012-01-31 DIAGNOSIS — N63 Unspecified lump in unspecified breast: Secondary | ICD-10-CM

## 2012-02-02 ENCOUNTER — Telehealth: Payer: Self-pay | Admitting: *Deleted

## 2012-02-02 NOTE — Telephone Encounter (Signed)
Pt states she is suppose to be referred to the sports med downstairs for her shoulder and knee pain. Please advise.

## 2012-02-02 NOTE — Telephone Encounter (Signed)
Pt scheduled with Dr. T.  

## 2012-02-02 NOTE — Telephone Encounter (Signed)
Ok to schedule with Dr. Karie Schwalbe.  I never addressed during her ov and told her to f/u to discuss this but if she wanst to see sports med that if fine.

## 2012-02-07 ENCOUNTER — Institutional Professional Consult (permissible substitution): Payer: BC Managed Care – PPO | Admitting: Sports Medicine

## 2012-02-10 ENCOUNTER — Ambulatory Visit (INDEPENDENT_AMBULATORY_CARE_PROVIDER_SITE_OTHER): Payer: BC Managed Care – PPO | Admitting: Sports Medicine

## 2012-02-10 ENCOUNTER — Encounter: Payer: Self-pay | Admitting: Sports Medicine

## 2012-02-10 ENCOUNTER — Ambulatory Visit (INDEPENDENT_AMBULATORY_CARE_PROVIDER_SITE_OTHER): Payer: BC Managed Care – PPO

## 2012-02-10 VITALS — BP 126/80 | HR 77 | Temp 97.8°F

## 2012-02-10 DIAGNOSIS — M25512 Pain in left shoulder: Secondary | ICD-10-CM

## 2012-02-10 DIAGNOSIS — M25569 Pain in unspecified knee: Secondary | ICD-10-CM

## 2012-02-10 DIAGNOSIS — M25561 Pain in right knee: Secondary | ICD-10-CM

## 2012-02-10 DIAGNOSIS — M19019 Primary osteoarthritis, unspecified shoulder: Secondary | ICD-10-CM

## 2012-02-10 DIAGNOSIS — M19011 Primary osteoarthritis, right shoulder: Secondary | ICD-10-CM | POA: Insufficient documentation

## 2012-02-10 DIAGNOSIS — M25519 Pain in unspecified shoulder: Secondary | ICD-10-CM

## 2012-02-10 MED ORDER — MELOXICAM 15 MG PO TABS: ORAL_TABLET | ORAL | Status: DC

## 2012-02-10 NOTE — Progress Notes (Signed)
Patient ID: Brianna Riley, female   DOB: 1956/05/29, 56 y.o.   MRN: [id number] Subjective:    I'm seeing this patient as a consultation for:  Dr. Linford Arnold  CC: left shoulder pain, right knee pain  HPI: Right knee pain: This is a present for multiple years.  It is located in her anterior knee, and his particularly bad when she goes up and down stairs, or gets up out of a chair. She also notes some grind on her kneecap. She denies any trauma, denies any pain in any other part of the knee. She also denies any radiation of the pain. The knee does not swell, cough, lock, or buckle. She has not yet tried any medications for this.  Left shoulder pain: Pain is localized over the lateral deltoid, is worse with overhead activities. The pain is localized, but occasionally radiates upper trapezius. She denies any trauma. The pain does not radiate down past the elbow. Worse with reaching overhead, better with rest. It does tend to wake her up from sleep.  Past medical history, Surgical history, Family history, Social history, Allergies, and medications have been entered into the medical record, reviewed, and no changes needed.   Review of Systems: No headache, visual changes, nausea, vomiting, diarrhea, constipation, dizziness, abdominal pain, skin rash, fevers, chills, night sweats, weight loss, body aches, joint swelling, muscle aches, chest pain, or shortness of breath.   Objective:   Vitals:  Afebrile, vital signs stable. General: Well Developed, well nourished, and in no acute distress.  Neuro: Alert and oriented x3, extra-ocular muscles intact.  Skin: Warm and dry, no rashes noted.  Respiratory: Not using accessory muscles, speaking in full sentences.  Cardiovascular: Pulses palpable, no extremity edema. Left Shoulder: Inspection reveals no abnormalities, atrophy or asymmetry. Palpation is normal with no tenderness over AC joint or bicipital groove. ROM is full in all planes. Rotator cuff  strength week to supraspinatus Positive Neer's, Hawkins, and Empty Can signs. Speeds and Yergason's tests normal. No labral pathology noted with negative Obrien's, negative clunk and good stability. Normal scapular function observed. No painful arc and no drop arm sign. No apprehension sign   Right knee: Normal to inspection with no erythema or effusion or obvious bony abnormalities. Palpation normal with no warmth, joint line tenderness, patellar tenderness, or condyle tenderness. ROM full in flexion and extension and lower leg rotation. Ligaments with solid consistent endpoints including ACL, PCL, LCL, MCL. Negative Mcmurray's, Apley's, and Thessalonian tests. Patellar compression is somewhat painful, and patellar glide it does reproduce crepitus at terminal extension. Quadriceps definition is poor.  I did order, obtained, and review, and interpret x-rays of her left shoulder, and right knee. 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  Right knee x-ray shows well maintained joint space in the tibial femoral, patellofemoral compartments. There is no sign of fracture, or dislocation. Overall negative knee x-ray.   Impression and Recommendations:

## 2012-02-10 NOTE — Assessment & Plan Note (Signed)
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 She will see physical therapy to work on [employer] strengthening, as well as hip abductors. Mobic for pain. If no better consider an injection into the joint at the next visit.

## 2012-02-10 NOTE — Assessment & Plan Note (Signed)
Symptoms are classic for rotator cuff dysfunction/impingement syndrome. 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 I would like the physical therapists to work with her on rotator cuff strengthening as well. If no better, we can consider a subacromial injection at the next visit, I would likely place a medication into the glenohumeral joint as well. I will see her back in 4-6 weeks.

## 2012-02-14 ENCOUNTER — Ambulatory Visit
Admission: RE | Admit: 2012-02-14 | Discharge: 2012-02-14 | Disposition: A | Discharge status: Home / Self Care | Payer: BC Managed Care – PPO | Source: Ambulatory Visit | Attending: Family Medicine | Admitting: Family Medicine

## 2012-02-14 DIAGNOSIS — N63 Unspecified lump in unspecified breast: Secondary | ICD-10-CM

## 2012-02-20 ENCOUNTER — Ambulatory Visit: Payer: BC Managed Care – PPO | Attending: Sports Medicine | Admitting: Physical Therapy

## 2012-02-20 DIAGNOSIS — M2550 Pain in unspecified joint: Secondary | ICD-10-CM | POA: Insufficient documentation

## 2012-02-20 DIAGNOSIS — IMO0001 Reserved for inherently not codable concepts without codable children: Secondary | ICD-10-CM | POA: Insufficient documentation

## 2012-02-20 DIAGNOSIS — R269 Unspecified abnormalities of gait and mobility: Secondary | ICD-10-CM | POA: Insufficient documentation

## 2012-02-20 DIAGNOSIS — M6281 Muscle weakness (generalized): Secondary | ICD-10-CM | POA: Insufficient documentation

## 2012-02-23 ENCOUNTER — Ambulatory Visit: Payer: BC Managed Care – PPO | Admitting: Physical Therapy

## 2012-02-27 ENCOUNTER — Ambulatory Visit: Payer: BC Managed Care – PPO | Admitting: Physical Therapy

## 2012-03-01 ENCOUNTER — Encounter: Payer: BC Managed Care – PPO | Admitting: Physical Therapy

## 2012-03-05 ENCOUNTER — Ambulatory Visit: Payer: BC Managed Care – PPO | Admitting: Physical Therapy

## 2012-03-09 ENCOUNTER — Ambulatory Visit: Payer: BC Managed Care – PPO | Admitting: Physical Therapy

## 2012-03-12 ENCOUNTER — Ambulatory Visit: Payer: BC Managed Care – PPO | Admitting: Physical Therapy

## 2012-03-15 ENCOUNTER — Ambulatory Visit: Payer: BC Managed Care – PPO | Attending: Sports Medicine | Admitting: Physical Therapy

## 2012-03-15 DIAGNOSIS — IMO0001 Reserved for inherently not codable concepts without codable children: Secondary | ICD-10-CM | POA: Insufficient documentation

## 2012-03-15 DIAGNOSIS — R269 Unspecified abnormalities of gait and mobility: Secondary | ICD-10-CM | POA: Insufficient documentation

## 2012-03-15 DIAGNOSIS — M6281 Muscle weakness (generalized): Secondary | ICD-10-CM | POA: Insufficient documentation

## 2012-03-15 DIAGNOSIS — M2550 Pain in unspecified joint: Secondary | ICD-10-CM | POA: Insufficient documentation

## 2012-03-16 ENCOUNTER — Encounter: Payer: Self-pay | Admitting: Sports Medicine

## 2012-03-16 ENCOUNTER — Ambulatory Visit (INDEPENDENT_AMBULATORY_CARE_PROVIDER_SITE_OTHER): Payer: BC Managed Care – PPO | Admitting: Sports Medicine

## 2012-03-16 ENCOUNTER — Encounter: Payer: Self-pay | Admitting: *Deleted

## 2012-03-16 VITALS — BP 105/68 | HR 93 | Temp 98.1°F | Wt 200.0 lb

## 2012-03-16 DIAGNOSIS — M25561 Pain in right knee: Secondary | ICD-10-CM

## 2012-03-16 DIAGNOSIS — M25519 Pain in unspecified shoulder: Secondary | ICD-10-CM

## 2012-03-16 DIAGNOSIS — M25512 Pain in left shoulder: Secondary | ICD-10-CM

## 2012-03-16 NOTE — Progress Notes (Signed)
Subjective:    CC: Followup left shoulder pain.  HPI: Brianna Riley has done very well, particularly with physical therapy. Her right knee pain is completely resolved. Her left shoulder pain is approximately 80% better, but is still present to the point where she would desire an injection. The pain is still localized posteriorly deep within the joint, and is not really bad with overhead motions at this point. It does not radiate. It is moderate in severity.  Past medical history, Surgical history, Family history, Social history, Allergies, and medications have been entered into the medical record, reviewed, and no changes needed.   Review of Systems: No fevers, chills, night sweats, weight loss, chest pain, or shortness of breath.   Objective:    General: Well Developed, well nourished, and in no acute distress.  Neuro: Alert and oriented x3, extra-ocular muscles intact.  HEENT: Normocephalic, atraumatic, pupils equal round reactive to light, neck supple, no masses, no lymphadenopathy, thyroid nonpalpable.  Skin: Warm and dry, no rashes. Cardiac: Regular rate and rhythm, no murmurs rubs or gallops.  Respiratory: Clear to auscultation bilaterally. Not using accessory muscles, speaking in full sentences. Left Shoulder: Inspection reveals no abnormalities, atrophy or asymmetry. Palpation is normal with no tenderness over AC joint or bicipital groove. ROM is full in all planes. Rotator cuff strength normal throughout. No signs of impingement with negative Neer and Hawkin's tests, empty can sign. Speeds and Yergason's tests normal. No labral pathology noted with negative Obrien's, negative clunk and good stability. Normal scapular function observed. No painful arc and no drop arm sign. No apprehension sign  Procedure: Real-time Ultrasound Guided Injection of left glenohumeral joint Device: GE Logiq E  Ultrasound guided injection is preferred based studies that show increased duration, increased  effect, greater accuracy, decreased procedural pain, increased response rate, and decreased cost with ultrasound guided versus blind injection.  Verbal informed consent obtained.  Time-out conducted.  Noted no overlying erythema, induration, or other signs of local infection.  Skin prepped in a sterile fashion.  Local anesthesia: Topical Ethyl chloride.  With sterile technique and under real time ultrasound guidance:  1 cc Kenalog 40, 4 cc lidocaine injected into the joint. Completed without difficulty  Pain immediately resolved suggesting accurate placement of the medication.  Advised to call if fevers/chills, erythema, induration, drainage, or persistent bleeding.  Images permanently stored and available for review in the ultrasound unit.  Impression: Technically successful ultrasound guided injection.   Impression and Recommendations:

## 2012-03-16 NOTE — Assessment & Plan Note (Signed)
Resolved status post physical therapy.

## 2012-03-16 NOTE — Assessment & Plan Note (Addendum)
Glenohumeral joint injection as above. Return to clinic in 4 weeks to see how she's doing.

## 2012-03-22 ENCOUNTER — Ambulatory Visit: Payer: BC Managed Care – PPO | Admitting: Family Medicine

## 2012-03-27 ENCOUNTER — Ambulatory Visit: Payer: BC Managed Care – PPO | Admitting: Family Medicine

## 2012-03-27 DIAGNOSIS — Z0289 Encounter for other administrative examinations: Secondary | ICD-10-CM

## 2012-04-16 ENCOUNTER — Ambulatory Visit: Payer: BC Managed Care – PPO | Admitting: Sports Medicine

## 2012-04-17 ENCOUNTER — Ambulatory Visit (INDEPENDENT_AMBULATORY_CARE_PROVIDER_SITE_OTHER): Payer: BC Managed Care – PPO | Admitting: Family Medicine

## 2012-04-17 ENCOUNTER — Encounter: Payer: Self-pay | Admitting: Family Medicine

## 2012-04-17 ENCOUNTER — Other Ambulatory Visit: Payer: Self-pay | Admitting: Family Medicine

## 2012-04-17 VITALS — BP 133/78 | HR 96 | Ht 64.0 in | Wt 199.0 lb

## 2012-04-17 DIAGNOSIS — I10 Essential (primary) hypertension: Secondary | ICD-10-CM

## 2012-04-17 DIAGNOSIS — R7401 Elevation of levels of liver transaminase levels: Secondary | ICD-10-CM

## 2012-04-17 DIAGNOSIS — R748 Abnormal levels of other serum enzymes: Secondary | ICD-10-CM

## 2012-04-17 DIAGNOSIS — E785 Hyperlipidemia, unspecified: Secondary | ICD-10-CM

## 2012-04-17 NOTE — Patient Instructions (Addendum)
IF liver enzymes are still elevated we will order an ultrasound of you liever.

## 2012-04-17 NOTE — Progress Notes (Signed)
  Subjective:    Patient ID: Brianna Riley, female    DOB: 1956-02-09, 56 y.o.   MRN: [id number]  HPI  Hypertension-here for followup. She did restart her medication on this. She come off of it on her own but we restart her blood pressure medication when she came in for her physical her blood pressure was high. She's tolerating it well without any side effects. Chest pain or shortness of breath.  Elevated liver enzymes-she says she has stopped all alcohol products. She's not really using any Tylenol products and is here to have her liver recheck today. She has no prior history of liver issues or problems. She is overweight.  Review of Systems     Objective:   Physical Exam  Constitutional: She is oriented to person, place, and time. She appears well-developed and well-nourished.  HENT:  Head: Normocephalic and atraumatic.  Cardiovascular: Normal rate, regular rhythm and normal heart sounds.   Pulmonary/Chest: Effort normal and breath sounds normal.  Neurological: She is alert and oriented to person, place, and time.  Skin: Skin is warm and dry.  Psychiatric: She has a normal mood and affect. Her behavior is normal.          Assessment & Plan:  Hypertension-well-controlled today. Continue current regimen. Followup in 6 months.  Hyperlipidemia-due to recheck lipids it has been over a year. I gave her a lab slip today.  Elevated liver enzymes-recheck today. Hopefully gums normal off of the alcohol. I suspect that she could have fatty liver so if her enzymes come back elevated we will add an acute hepatitis panel and an ultrasound.  Obesity - work on diet and exercise.

## 2012-04-18 ENCOUNTER — Other Ambulatory Visit: Payer: Self-pay | Admitting: Family Medicine

## 2012-04-18 ENCOUNTER — Encounter: Payer: Self-pay | Admitting: Sports Medicine

## 2012-04-18 ENCOUNTER — Ambulatory Visit (INDEPENDENT_AMBULATORY_CARE_PROVIDER_SITE_OTHER): Payer: BC Managed Care – PPO | Admitting: Sports Medicine

## 2012-04-18 ENCOUNTER — Telehealth: Payer: Self-pay | Admitting: *Deleted

## 2012-04-18 VITALS — BP 118/82 | HR 91 | Wt 199.0 lb

## 2012-04-18 DIAGNOSIS — R945 Abnormal results of liver function studies: Secondary | ICD-10-CM

## 2012-04-18 DIAGNOSIS — M25512 Pain in left shoulder: Secondary | ICD-10-CM

## 2012-04-18 DIAGNOSIS — R748 Abnormal levels of other serum enzymes: Secondary | ICD-10-CM

## 2012-04-18 DIAGNOSIS — M25519 Pain in unspecified shoulder: Secondary | ICD-10-CM

## 2012-04-18 LAB — COMPLETE METABOLIC PANEL WITH GFR
ALT: 48 U/L — ABNORMAL HIGH (ref 0–35)
Albumin: 4.7 g/dL (ref 3.5–5.2)
Alkaline Phosphatase: 112 U/L (ref 39–117)
CO2: 29 mEq/L (ref 19–32)
GFR, Est African American: 61 mL/min
GFR, Est Non African American: 53 mL/min — ABNORMAL LOW
Glucose, Bld: 100 mg/dL — ABNORMAL HIGH (ref 70–99)
Potassium: 4.7 mEq/L (ref 3.5–5.3)
Sodium: 140 mEq/L (ref 135–145)
Total Protein: 7.3 g/dL (ref 6.0–8.3)

## 2012-04-18 LAB — LIPID PANEL
HDL: 72 mg/dL (ref >39)
LDL Cholesterol: 144 mg/dL — ABNORMAL HIGH (ref 0–99)
Triglycerides: 268 mg/dL — ABNORMAL HIGH (ref <150)
VLDL: 54 mg/dL — ABNORMAL HIGH (ref 0–40)

## 2012-04-18 MED ORDER — PRAVASTATIN SODIUM 80 MG PO TABS: 80.0000 mg | ORAL_TABLET | Freq: Every day | ORAL | Status: DC

## 2012-04-18 NOTE — Telephone Encounter (Signed)
Message copied by Florestine Avers on Wed Apr 18, 2012  4:25 PM ------      Message from: Nani Gasser D      Created: Wed Apr 18, 2012  2:17 PM       Call patient: Kidney function is up a little bit it looks like she's a little dehydrated. Make sure drinking plenty of fluids. This may have just been from fasting for the blood work. Liver enzymes are still elevated. Please call the lab and have them add an acute hepatitis panel. Also will schedule her for an ultrasound of the liver.

## 2012-04-18 NOTE — Progress Notes (Signed)
SPORTS MEDICINE CONSULTATION REPORT  Subjective:    CC: Fall  HPI: Brianna Riley is a very pleasant 56 year old female who was seen before for left glenohumeral joint DJD, as well as knee pain. I injected her left glenohumeral joint sometime ago, and placed her in physical therapy for her knee. Overall her shoulder pain and her knee pain were completely resolved, unfortunately she took a fall 4 days ago. She fell into a thorny rose bush.  Since then she's had pain that she localizes over her entire left shoulder. She continues to have good range of motion, no bruising. She also has a little bit of pain she localizes up the paraspinal muscles on the left side of her neck. She really hasn't tried any medication for this, and has been using salonpas pads which have been very helpful.  Past medical history, Surgical history, Family history, Social history, Allergies, and medications have been entered into the medical record, reviewed, and no changes needed.   Review of Systems: No headache, visual changes, nausea, vomiting, diarrhea, constipation, dizziness, abdominal pain, skin rash, fevers, chills, night sweats, weight loss, swollen lymph nodes, body aches, joint swelling, muscle aches, chest pain, or shortness of breath.   Objective:   Vitals:  Afebrile, vital signs stable. General: Well Developed, well nourished, and in no acute distress.  Neuro/Psych: Alert and oriented x3, extra-ocular muscles intact, able to move all 4 extremities.  Skin: Warm and dry, no rashes noted.  Respiratory: Not using accessory muscles, speaking in full sentences, trachea midline.  Cardiovascular: Pulses palpable, no extremity edema. Abdomen: Does not appear distended. Left Shoulder: Inspection reveals no abnormalities, atrophy or asymmetry. Palpation is normal with no tenderness over AC joint or bicipital groove. ROM is full in all planes. Rotator cuff strength normal throughout. No signs of impingement with negative  Neer and Hawkin's tests, empty can sign. Speeds and Yergason's tests normal. No labral pathology noted with negative Obrien's, negative clunk and good stability. Normal scapular function observed. No painful arc and no drop arm sign. No apprehension sign  Neck: Inspection unremarkable. Mildly tender to palpation along the left trapezius. No palpable stepoffs. Negative Spurling's maneuver. Full neck range of motion Grip strength and sensation normal in bilateral hands Strength good C4 to T1 distribution No sensory change to C4 to T1 Negative Hoffman sign bilaterally Reflexes normal  Impression and Recommendations:   This case required medical decision making of moderate complexity.

## 2012-04-18 NOTE — Assessment & Plan Note (Signed)
Left shoulder and neck, this is most likely related to some soft tissue contusion, as well as some strain of her cervical paraspinal muscles. She will continue her topical pads, heating modalities, and Mobic that she has left over.

## 2012-04-18 NOTE — Telephone Encounter (Signed)
Patient aware of labs results and [place] scheduled for tomorrow,

## 2012-04-18 NOTE — Telephone Encounter (Signed)
Left message with patient's husband for her to call back. Need to discuss lab results. Dr. Linford Arnold would like for her to have an [place] of her liver. She is scheduled for 04/19/12 @ 1015am @ [facility]. She is to fast after 12am tonight.Per Dr. Linford Arnold request I called [facility] and added and acute hepatitis panel.

## 2012-04-18 NOTE — Assessment & Plan Note (Signed)
It looks as though her liver ultrasound is scheduled for tomorrow, we reminded her of the date and time and location. She will followup with Dr. Linford Arnold for all of her medical issues.

## 2012-04-19 ENCOUNTER — Ambulatory Visit (INDEPENDENT_AMBULATORY_CARE_PROVIDER_SITE_OTHER): Payer: BC Managed Care – PPO

## 2012-04-19 ENCOUNTER — Telehealth: Payer: Self-pay | Admitting: *Deleted

## 2012-04-19 DIAGNOSIS — R748 Abnormal levels of other serum enzymes: Secondary | ICD-10-CM

## 2012-04-19 DIAGNOSIS — K7689 Other specified diseases of liver: Secondary | ICD-10-CM

## 2012-04-19 DIAGNOSIS — R7401 Elevation of levels of liver transaminase levels: Secondary | ICD-10-CM

## 2012-04-19 LAB — HEPATITIS PANEL, ACUTE
HCV Ab: NEGATIVE
Hepatitis B Surface Ag: NEGATIVE

## 2012-04-19 NOTE — Telephone Encounter (Signed)
Patient notified of [place] results and to started on low fat and low cholesterol diet and exercise Repeat labs in 4 months.

## 2012-04-19 NOTE — Telephone Encounter (Signed)
Message copied by Florestine Avers on Thu Apr 19, 2012  4:39 PM ------      Message from: Nani Gasser D      Created: Thu Apr 19, 2012 12:36 PM       Call patient: Ultrasound shows that she does most likely have a fatty liver. Listening to music that is very hard on low-fat low-cholesterol diet and work out regularly. She can go to the [employer] and doesn't further information about a low-cholesterol diet. I would then like to recheck her liver enzymes in about 4 months.

## 2012-05-25 IMAGING — US US TRANSVAGINAL NON-OB
1 series · 14 of 25 positions shown · non-contrast
Comparison: None

CLINICAL DATA: Post menopausal bleeding



[Series 1: us transvaginal non-ob · 0.22mm/px · 14 of 49 slices shown]
[im 1/49]
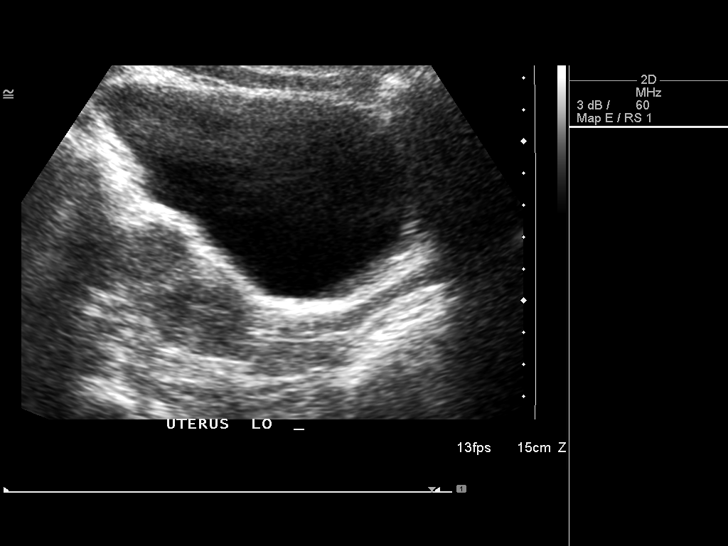
[im 5/49]
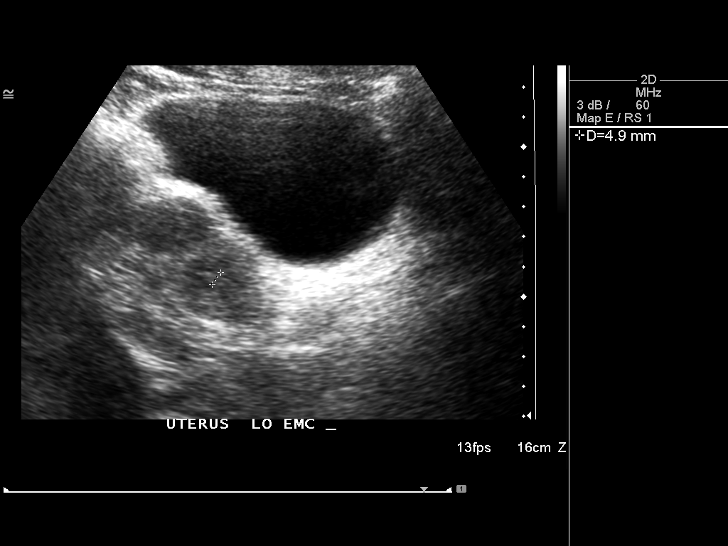
[im 9/49]
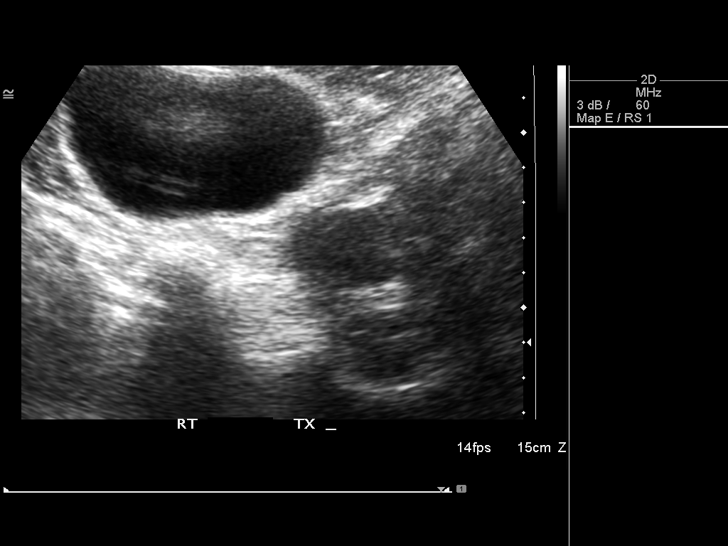
[im 13/49]
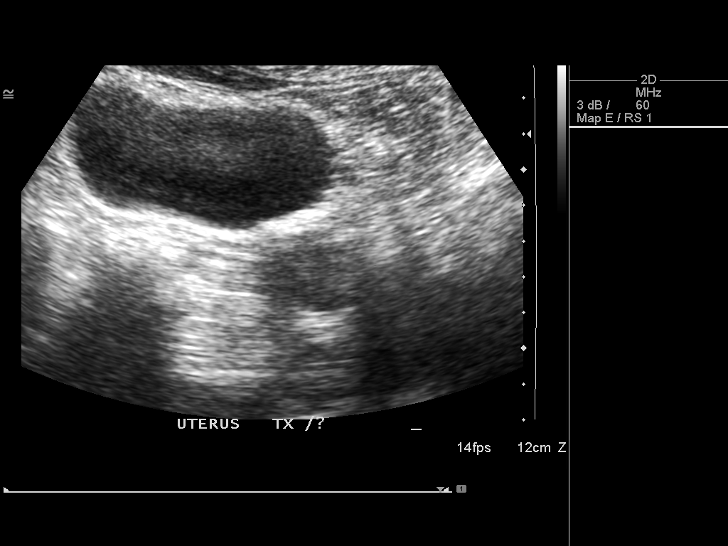
[im 17/49]
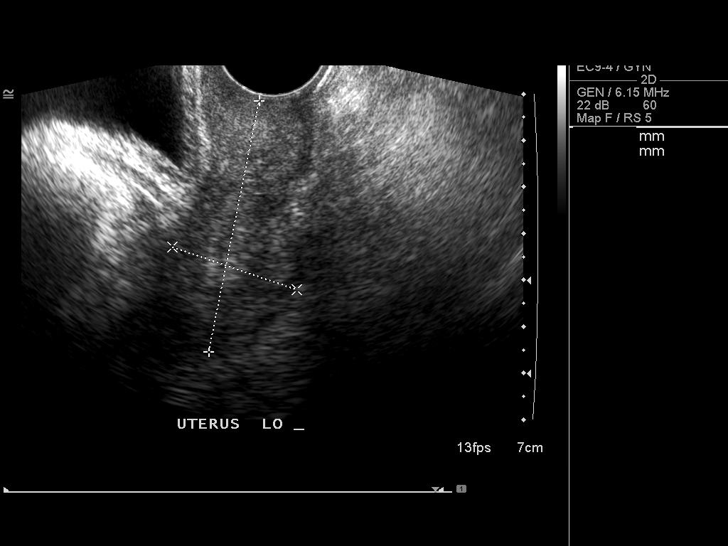
[im 19/49]
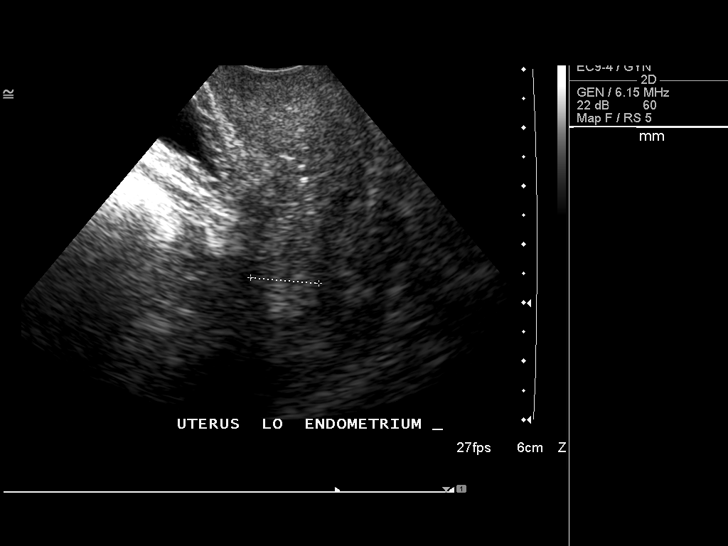
[im 23/49]
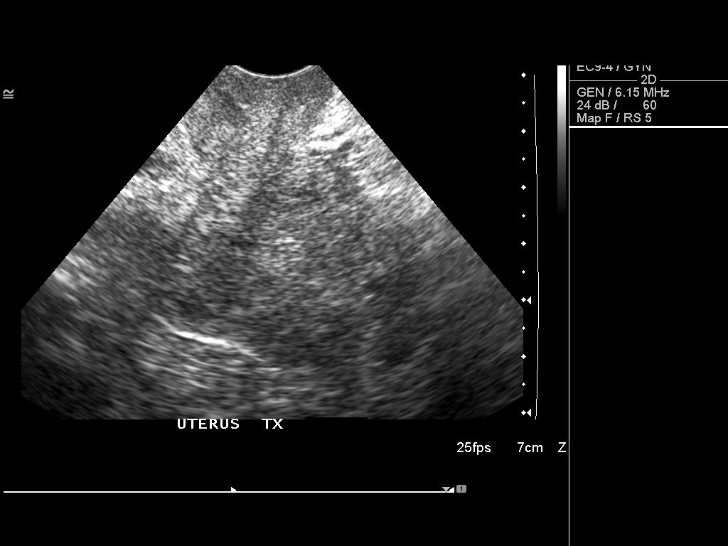
[im 27/49]
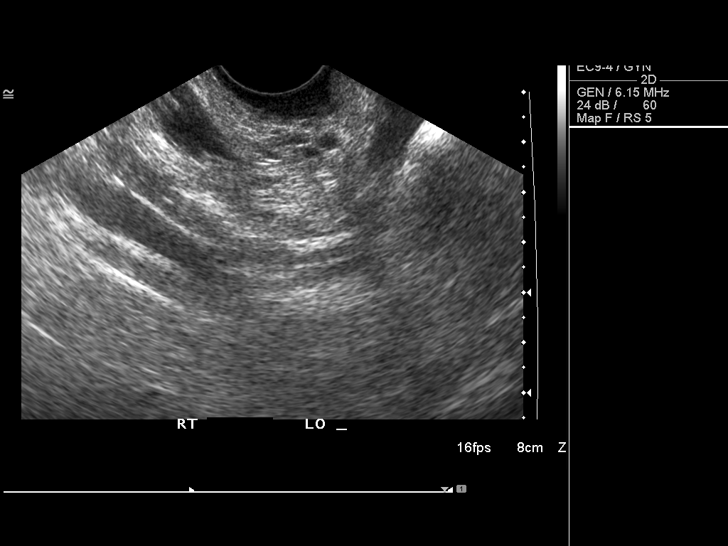
[im 31/49]
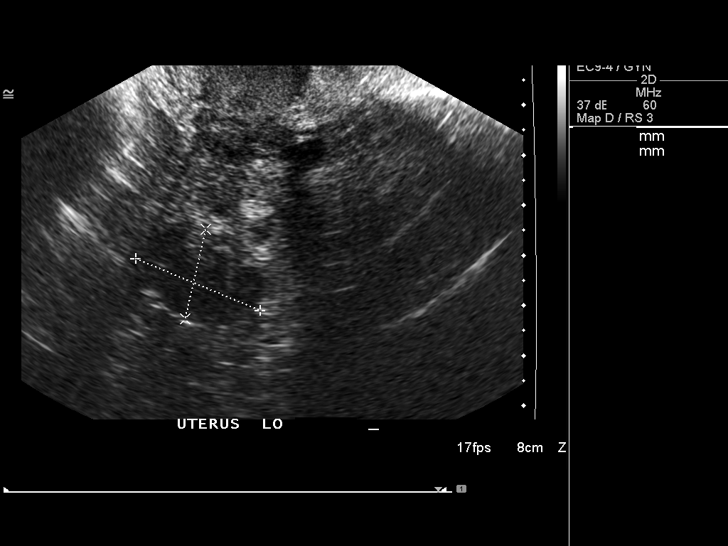
[im 33/49]
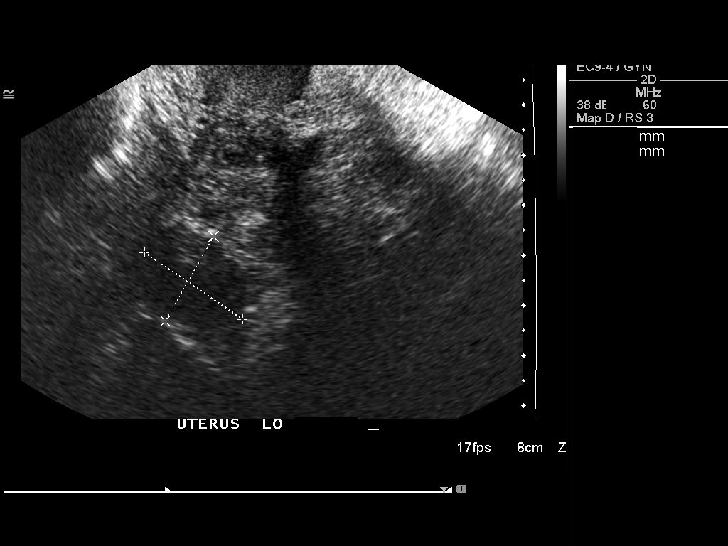
[im 37/49]
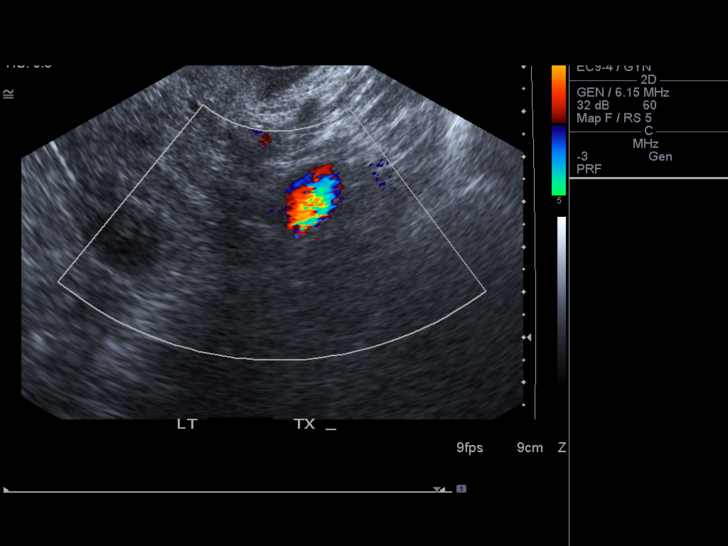
[im 41/49]
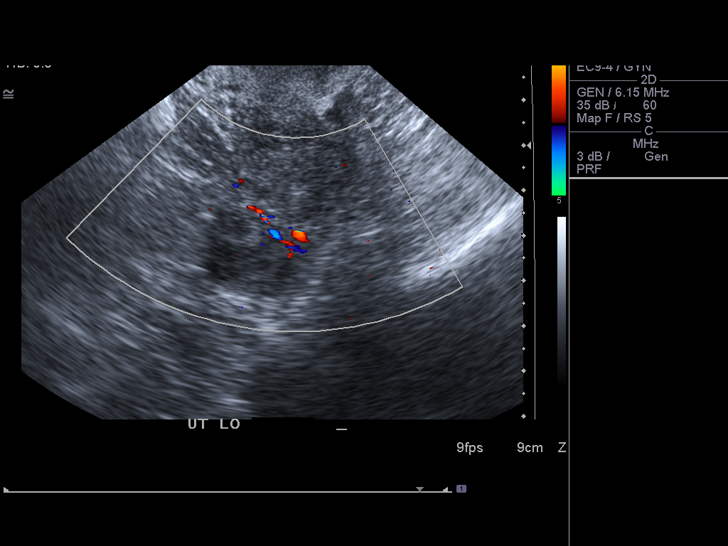
[im 45/49]
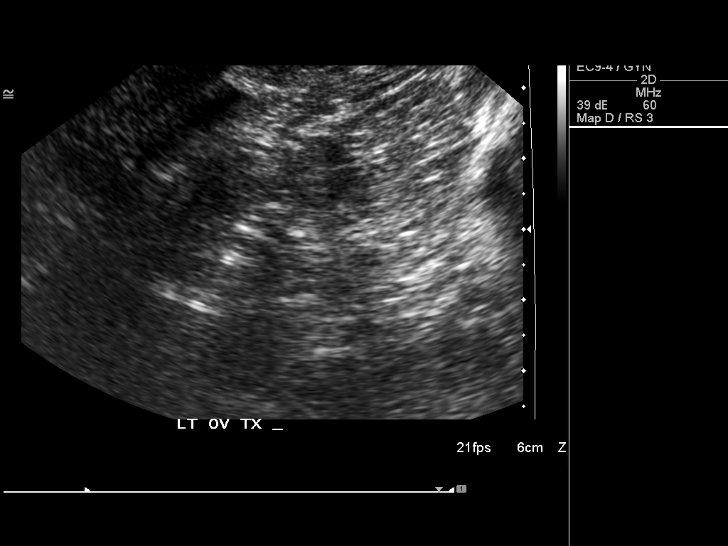
[im 49/49]
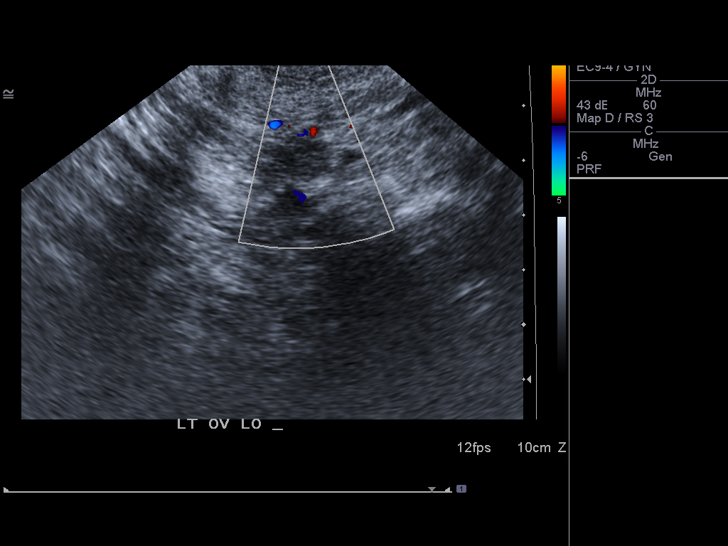

[14 of 25 positions shown; findings below may reference images not displayed]

FINDINGS: Uterus retroflexed uterus measuring  6.6 x 2.3 x 3.1 cm.  There is
a fundal fibroid measuring 2.7 x 1.8 x 2.7 cm.

Endometrium 11.8 cm in thickness.  Endometrium is thickened but
homogeneous.

Right Ovary 1.7 x 1.1 x 1.2 cm.

Left Ovary 1.2 x 1.0 x 0.9 cm.  The ovaries are difficult to
visualized due to body habitus.

Other Findings:  Trace free fluid in the cul-de-sac
IMPRESSION: Fundal fibroid

Thickened endometrium at 11.8 mm.   Endometrial biopsy suggested.

## 2012-08-20 IMAGING — CR DG CHEST 2V
2 series · 2 of 2 positions shown · non-contrast
Comparison: 08/06/2008

CLINICAL DATA: Shortness of breath on exertion.  Hypoxemia.

CHEST - 2 VIEW

[view not recorded (1 of 2)]
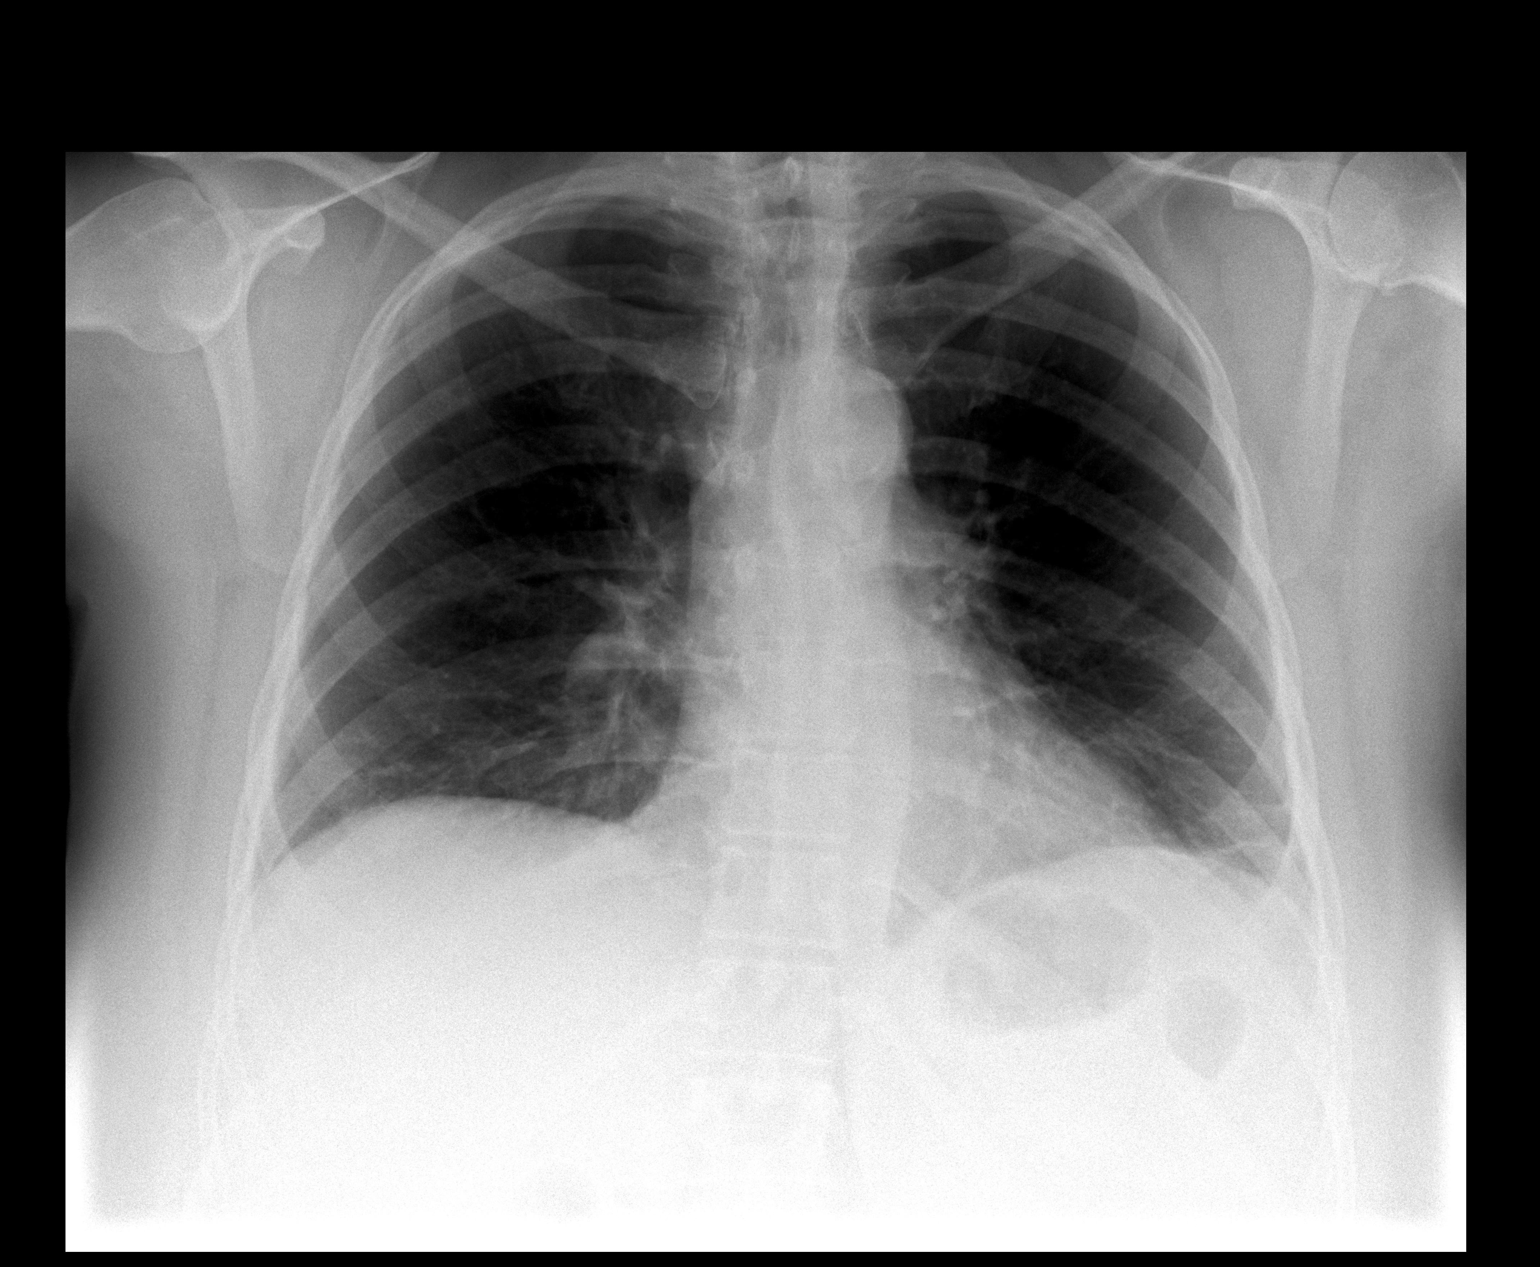

[view not recorded (2 of 2)]
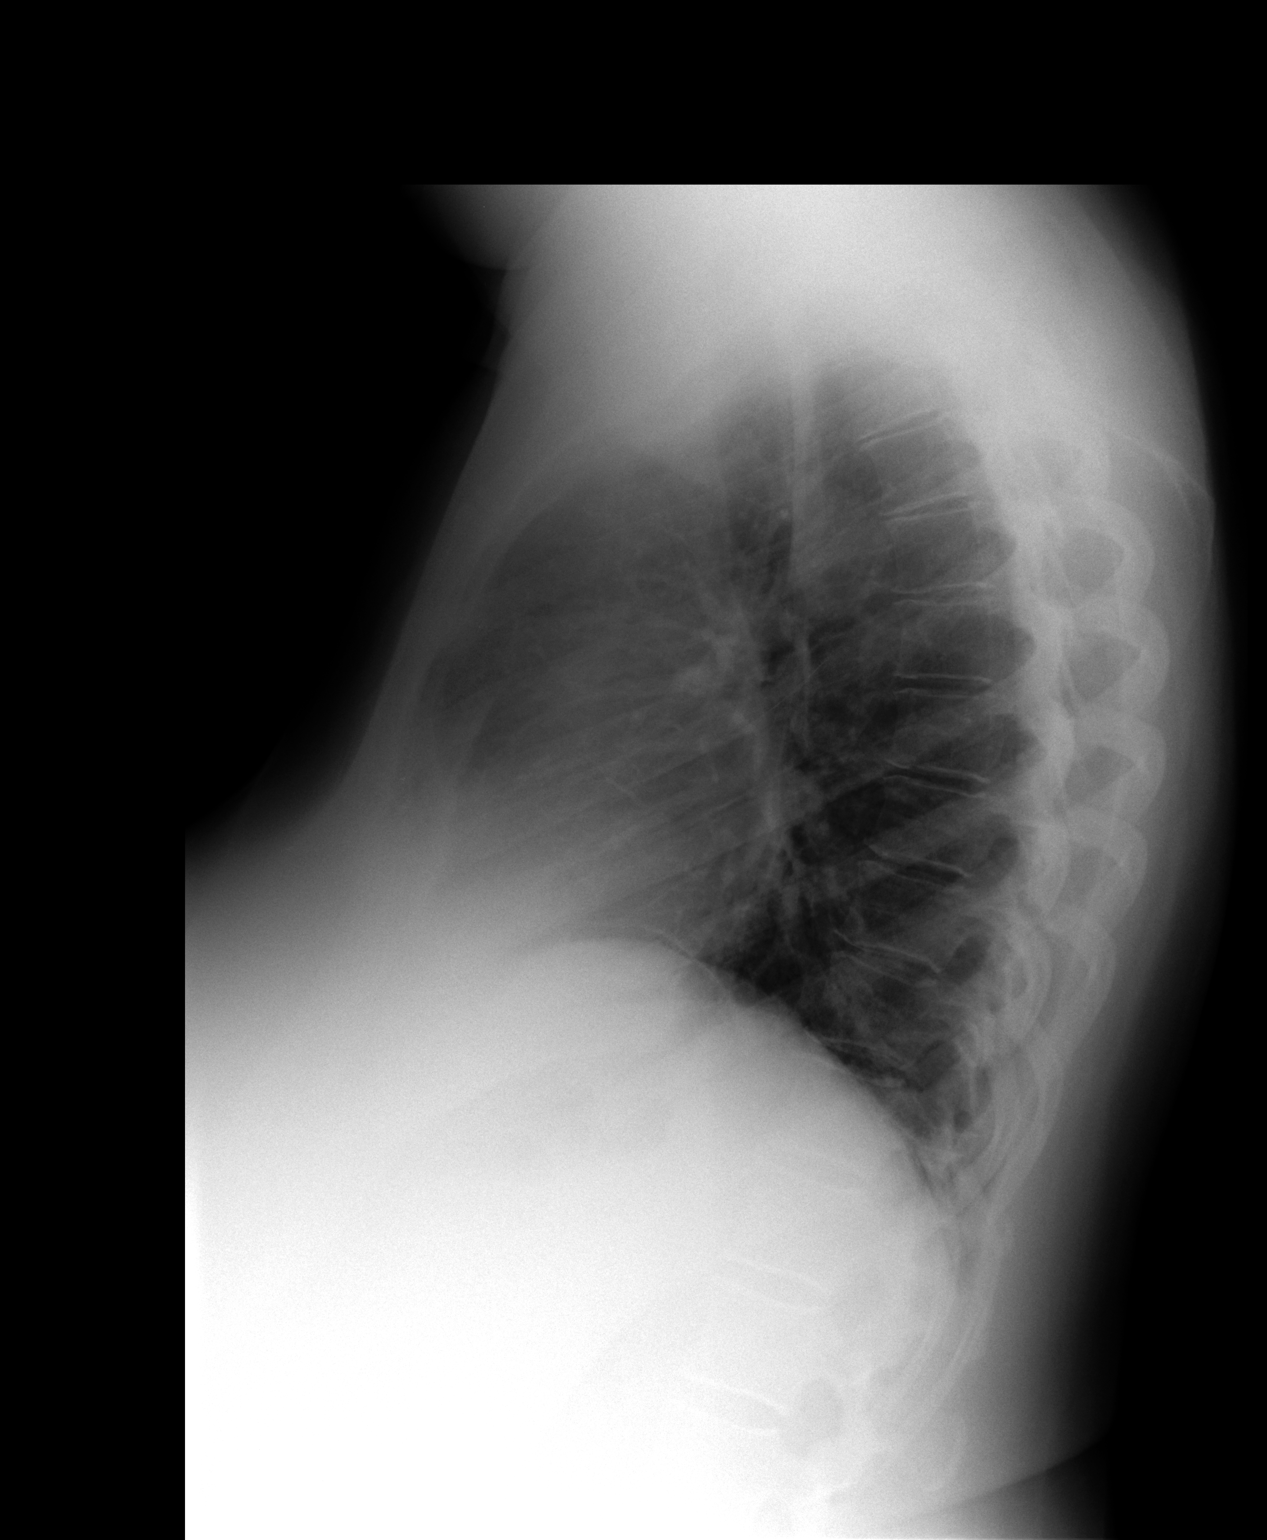

[2 of 2 positions shown; findings below may reference images not displayed]

FINDINGS: Trachea is midline.  Heart size normal.  Lungs are
somewhat low in volume with bibasilar linear scarring.  No pleural
fluid.
IMPRESSION: Low lung volumes with bibasilar linear scarring.

## 2012-08-29 ENCOUNTER — Other Ambulatory Visit: Payer: Self-pay | Admitting: Family Medicine

## 2012-09-03 ENCOUNTER — Ambulatory Visit (INDEPENDENT_AMBULATORY_CARE_PROVIDER_SITE_OTHER): Payer: BC Managed Care – PPO | Admitting: Family Medicine

## 2012-09-03 ENCOUNTER — Encounter: Payer: Self-pay | Admitting: Family Medicine

## 2012-09-03 VITALS — BP 107/75 | HR 96 | Ht 63.0 in | Wt 198.0 lb

## 2012-09-03 DIAGNOSIS — Z862 Personal history of diseases of the blood and blood-forming organs and certain disorders involving the immune mechanism: Secondary | ICD-10-CM

## 2012-09-03 DIAGNOSIS — R748 Abnormal levels of other serum enzymes: Secondary | ICD-10-CM

## 2012-09-03 DIAGNOSIS — E785 Hyperlipidemia, unspecified: Secondary | ICD-10-CM

## 2012-09-03 DIAGNOSIS — R7401 Elevation of levels of liver transaminase levels: Secondary | ICD-10-CM

## 2012-09-03 DIAGNOSIS — Z0000 Encounter for general adult medical examination without abnormal findings: Secondary | ICD-10-CM

## 2012-09-03 DIAGNOSIS — N951 Menopausal and female climacteric states: Secondary | ICD-10-CM

## 2012-09-03 MED ORDER — LISINOPRIL-HYDROCHLOROTHIAZIDE 20-12.5 MG PO TABS: 1.0000 | ORAL_TABLET | Freq: Every day | ORAL | Status: DC

## 2012-09-03 MED ORDER — PRAVASTATIN SODIUM 80 MG PO TABS: 80.0000 mg | ORAL_TABLET | Freq: Every day | ORAL | Status: DC

## 2012-09-03 MED ORDER — ZOLPIDEM TARTRATE 5 MG PO TABS: 5.0000 mg | ORAL_TABLET | Freq: Every evening | ORAL | Status: DC | PRN

## 2012-09-03 NOTE — Patient Instructions (Addendum)
Keep up a regular exercise program and make sure you are eating a healthy diet Try to eat 4 servings of dairy a day, or if you are lactose intolerant take a calcium with vitamin D daily.  Your vaccines are up to date.   Fatty Liver Fatty liver is the accumulation of fat in liver cells. It is also called hepatosteatosis or steatohepatitis. It is normal for your liver to contain some fat. If fat is more than 5 to 10% of your liver's weight, you have fatty liver.  There are often no symptoms (problems) for years while damage is still occurring. People often learn about their fatty liver when they have medical tests for other reasons. Fat can damage your liver for years or even decades without causing problems. When it becomes severe, it can cause fatigue, weight loss, weakness, and confusion. This makes you more likely to develop more serious liver problems. The liver is the largest organ in the body. It does a lot of work and often gives no warning signs when it is sick until late in a disease. The liver has many important jobs including:  Breaking down foods.  Storing vitamins, iron, and other minerals.  Making proteins.  Making bile for food digestion.  Breaking down many products including medications, alcohol and some poisons. CAUSES  There are a number of different conditions, medications, and poisons that can cause a fatty liver. Eating too many calories causes fat to build up in the liver. Not processing and breaking fats down normally may also cause this. Certain conditions, such as obesity, diabetes, and high triglycerides also cause this. Most fatty liver patients tend to be middle-aged and over weight.  Some causes of fatty liver are:  Alcohol over consumption.  Malnutrition.  Steroid use.  Valproic acid toxicity.  Obesity.  Cushing's syndrome.  Poisons.  Tetracycline in high dosages.  Pregnancy.  Diabetes.  Hyperlipidemia.  Rapid weight loss. Some people  develop fatty liver even having none of these conditions. SYMPTOMS  Fatty liver most often causes no problems. This is called asymptomatic.  It can be diagnosed with blood tests and also by a liver biopsy.  It is one of the most common causes of minor elevations of liver enzymes on routine blood tests.  Specialized Imaging of the liver using ultrasound, CT (computed tomography) scan, or MRI (magnetic resonance imaging) can suggest a fatty liver but a biopsy is needed to confirm it.  A biopsy involves taking a small sample of liver tissue. This is done by using a needle. It is then looked at under a microscope by a specialist. TREATMENT  It is important to treat the cause. Simple fatty liver without a medical reason may not need treatment.  Weight loss, fat restriction, and exercise in overweight patients produces inconsistent results but is worth trying.  Fatty liver due to alcohol toxicity may not improve even with stopping drinking.  Good control of diabetes may reduce fatty liver.  Lower your triglycerides through diet, medication or both.  Eat a balanced, healthy diet.  Increase your physical activity.  Get regular checkups from a liver specialist.  There are no medical or surgical treatments for a fatty liver or NASH, but improving your diet and increasing your exercise may help prevent or reverse some of the damage. PROGNOSIS  Fatty liver may cause no damage or it can lead to an inflammation of the liver. This is, called steatohepatitis. When it is linked to alcohol abuse, it is called alcoholic  steatohepatitis. It often is not linked to alcohol. It is then called nonalcoholic steatohepatitis, or NASH. Over time the liver may become scarred and hardened. This condition is called cirrhosis. Cirrhosis is serious and may lead to liver failure or cancer. NASH is one of the leading causes of cirrhosis. About 10-20% of Americans have fatty liver and a smaller 2-5% has NASH. Document  Released: 07/08/2005 Document Revised: 08/15/2011 Document Reviewed: 08/31/2005 [facility] Patient Information 2013 [place], [place].

## 2012-09-03 NOTE — Progress Notes (Signed)
  Subjective:     Brianna Riley is a 58 y.o. female and is here for a comprehensive physical exam. The patient reports no problems.  History   Social History  . Marital Status: Married    Spouse Name: N/A    Number of Children: N/A  . Years of Education: N/A   Occupational History  . UNEMPLOYED   . Retired    Social History Main Topics  . Smoking status: Former Smoker    Quit date: 06/06/1996  . Smokeless tobacco: Not on file  . Alcohol Use: Yes     Comment: Occasional EtOH  . Drug Use: Not on file  . Sexually Active: No   Other Topics Concern  . 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 Influenza Vaccine  02/04/2013  . Mammogram  02/13/2014  . Pap Smear  01/27/2015  . Colonoscopy  06/06/2017  . Tetanus/tdap  11/06/2017    The following portions of the patient's history were reviewed and updated as appropriate: allergies, current medications, past family history, past medical history, past social history, past surgical history and problem list.  Review of Systems A comprehensive review of systems was negative.   Objective:    BP 107/75  Pulse 96  Ht 5\' 3"  (1.6 m)  Wt 198 lb (89.812 kg)  BMI 35.08 kg/m2  SpO2 90% General appearance: alert, cooperative and appears stated age Head: Normocephalic, without obvious abnormality, atraumatic Eyes: conj clear, EOMi, PEERLA, evidence of lens implants.  Ears: normal TM's and external ear canals both ears Nose: Nares normal. Septum midline. Mucosa normal. No drainage or sinus tenderness. Throat: lips, mucosa, and tongue normal; teeth and gums normal Neck: no adenopathy, no carotid bruit, no JVD, supple, symmetrical, trachea midline and thyroid not enlarged, symmetric, no tenderness/mass/nodules Back:  symmetric, no curvature. ROM normal. No CVA tenderness. Lungs: clear to auscultation bilaterally Breasts: normal appearance, no masses or tenderness Heart: regular rate and rhythm, S1, S2 normal, no murmur, click, rub or gallop Abdomen: soft, non-tender; bowel sounds normal; no masses,  no organomegaly Extremities: extremities normal, atraumatic, no cyanosis or edema Pulses: 2+ and symmetric Skin: Skin color, texture, turgor normal. No rashes or lesions Lymph nodes: Cervical, supraclavicular, and axillary nodes normal. Neurologic: Grossly normal    Assessment:    Healthy female exam.      Plan:     See After Visit Summary for Counseling Recommendations   Insomnia - Use prn ambien. Warned about addiction potential and to use it infrequently. Also warned about sedation. If she experiences any sleepwalking etc. on the medication and she is to stop it immediately and discontinue it.  Elevated liver enzymes/Fatty liver - . She hasn' thad any alcohol since I last saw her.  Due to recheck enzymes. US done in November showed fatty liver. Remind her again about the importance of low-fat low-cholesterol diet in addition to weight loss and regular exercise to help prevent cirrhosis of the liver. We will recheck her liver enzymes today.  Hyperlipidemia - not well-controlled back in November. We will recheck levels again today.

## 2012-09-04 LAB — COMPLETE METABOLIC PANEL WITH GFR
AST: 31 U/L (ref 0–37)
Albumin: 4 g/dL (ref 3.5–5.2)
Alkaline Phosphatase: 100 U/L (ref 39–117)
BUN: 12 mg/dL (ref 6–23)
CO2: 29 mEq/L (ref 19–32)
Chloride: 104 mEq/L (ref 96–112)
Creat: 1.23 mg/dL — ABNORMAL HIGH (ref 0.50–1.10)
Total Protein: 6.8 g/dL (ref 6.0–8.3)

## 2012-09-04 LAB — LIPID PANEL
LDL Cholesterol: 185 mg/dL — ABNORMAL HIGH (ref 0–99)
Total CHOL/HDL Ratio: 4.5 Ratio
VLDL: 52 mg/dL — ABNORMAL HIGH (ref 0–40)

## 2012-09-04 LAB — CBC
Platelets: 293 10*3/uL (ref 150–400)
RBC: 4.46 MIL/uL (ref 3.87–5.11)
WBC: 6.4 10*3/uL (ref 4.0–10.5)

## 2012-10-08 ENCOUNTER — Other Ambulatory Visit: Payer: Self-pay | Admitting: Family Medicine

## 2012-11-07 IMAGING — CR DG KNEE 1-2V*L*
2 series · 2 of 2 positions shown · non-contrast
Comparison: None.

CLINICAL DATA: Left knee pain for 2 months.

LEFT KNEE - 1-2 VIEW

[view not recorded (1 of 2)]
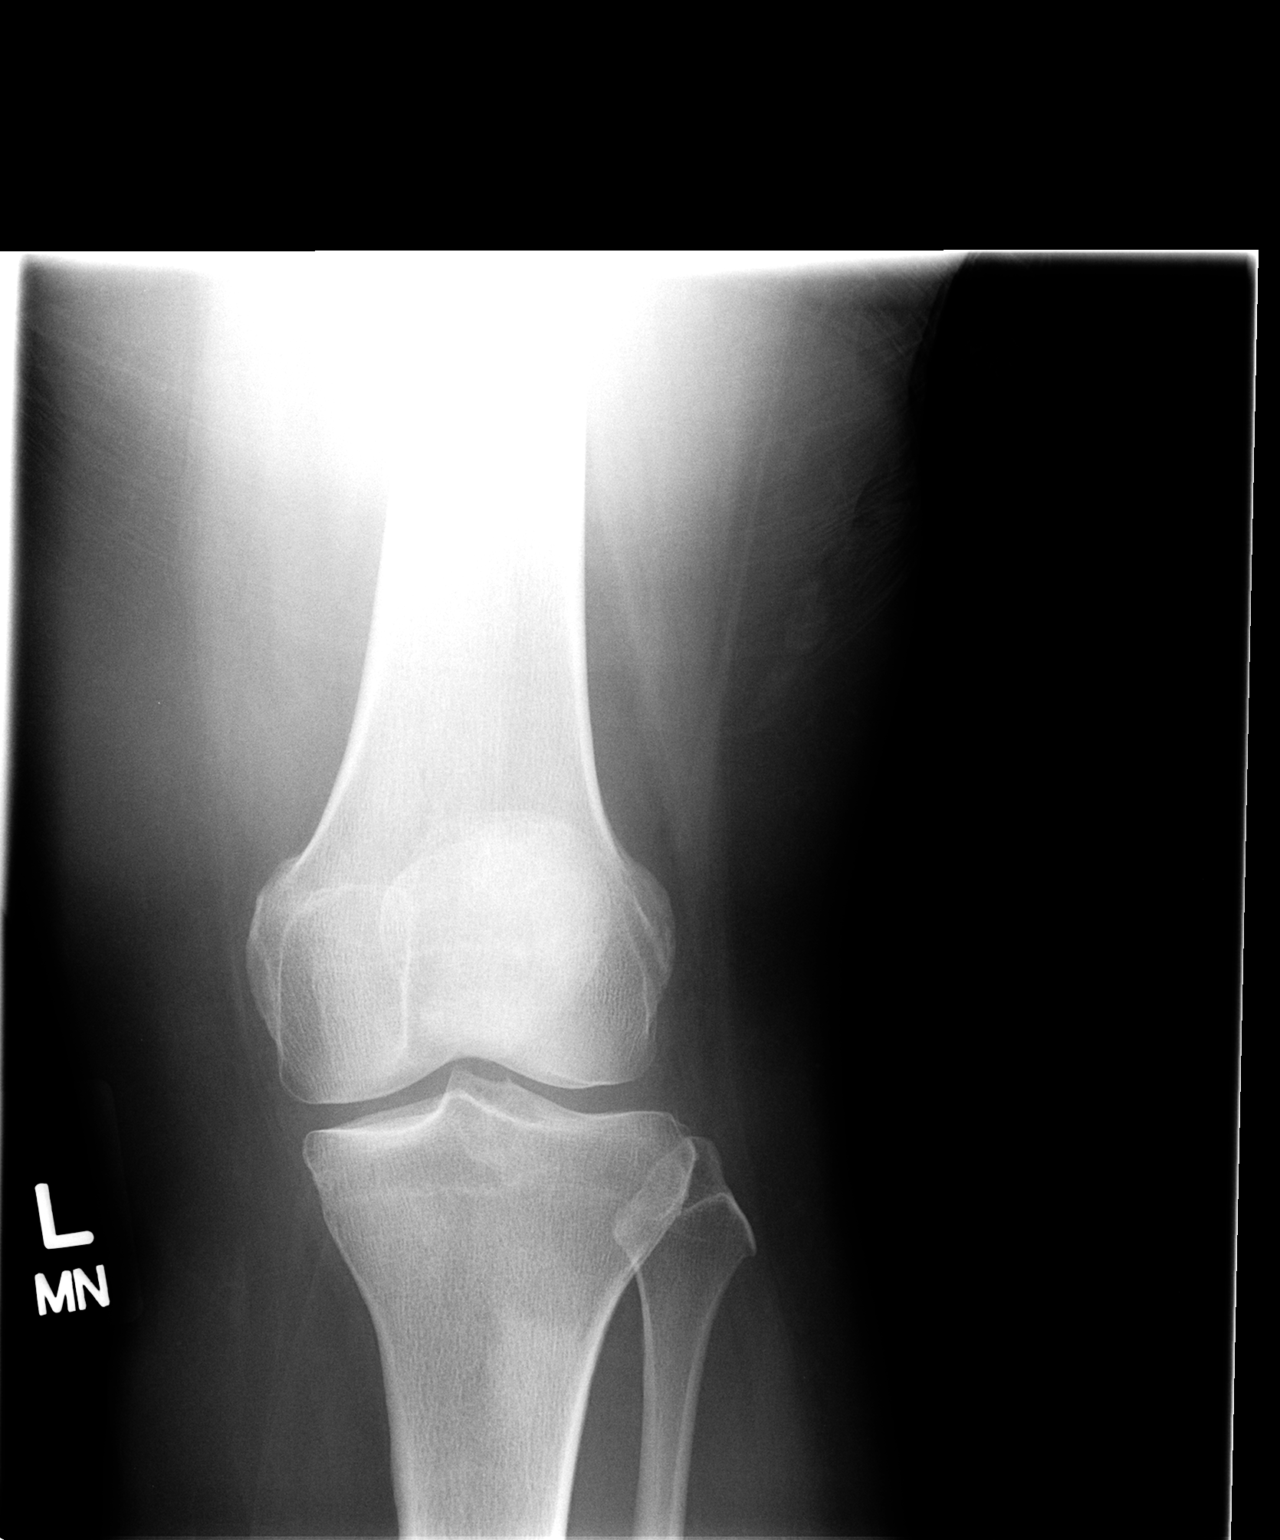

[view not recorded (2 of 2)]
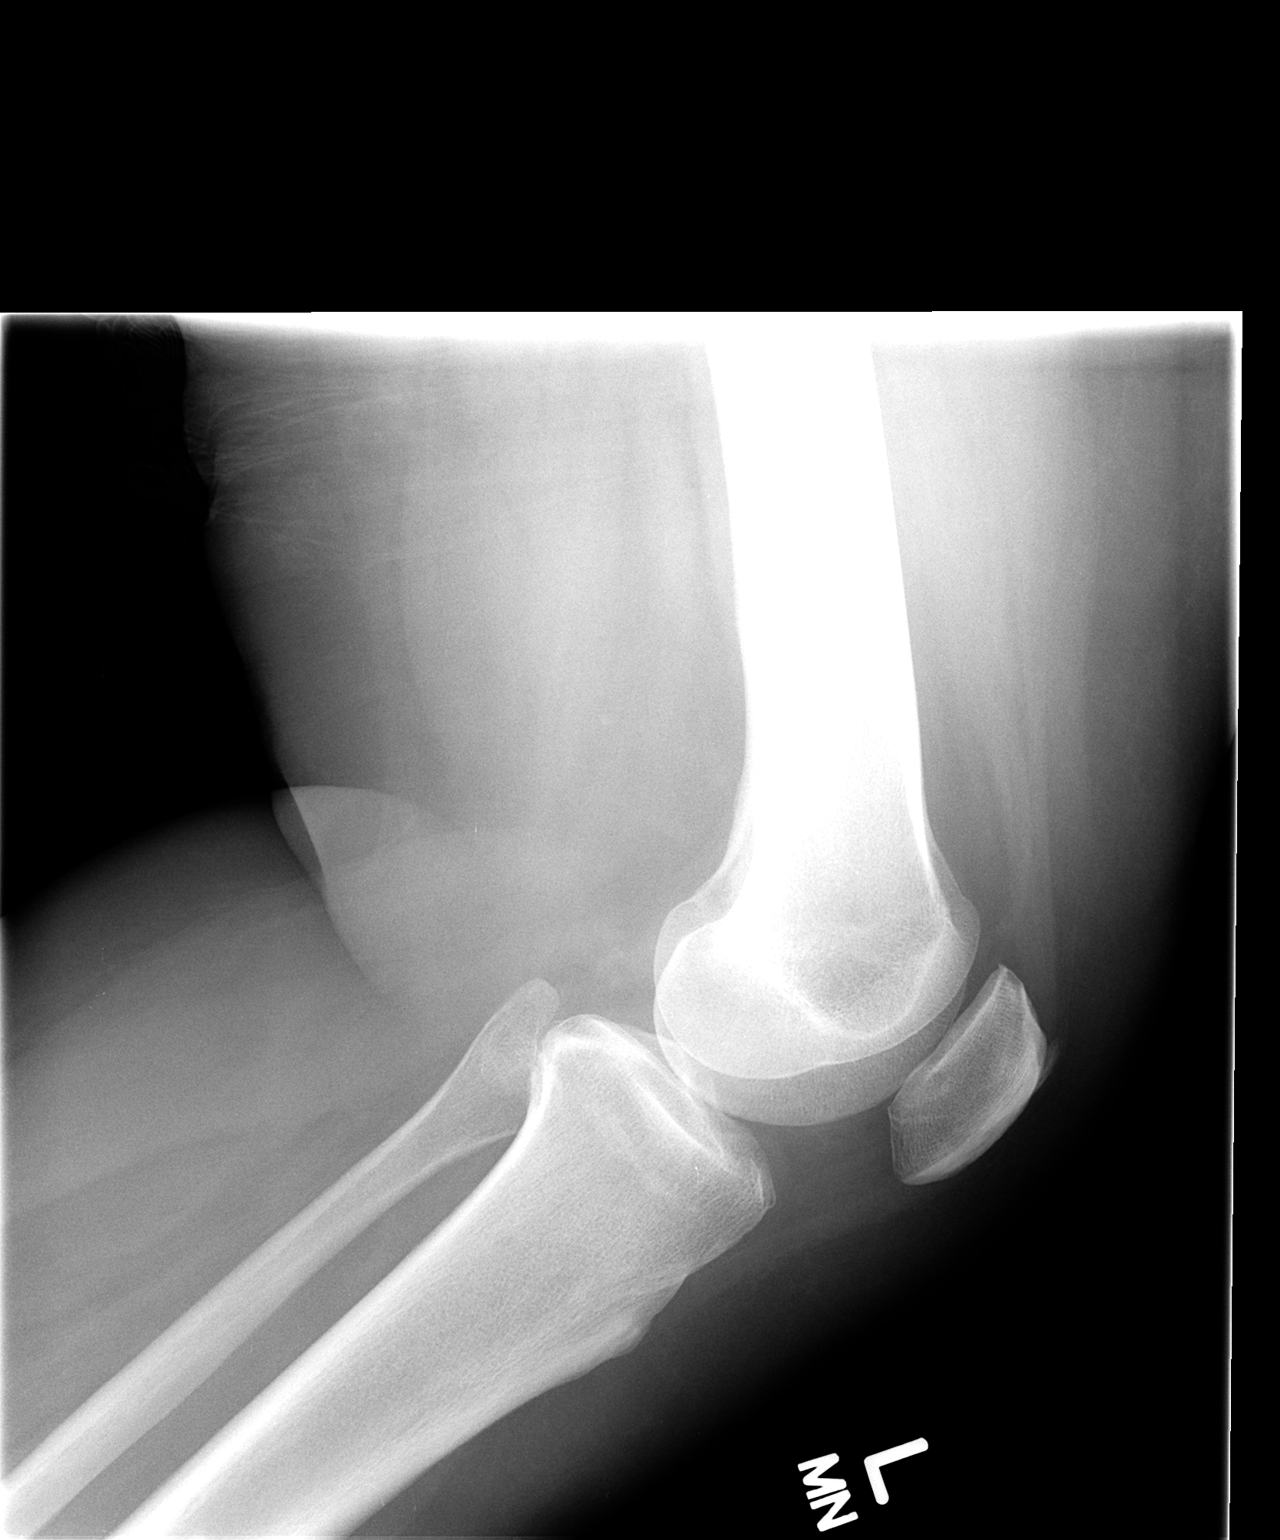

[2 of 2 positions shown; findings below may reference images not displayed]

FINDINGS: There is no fracture, dislocation, joint effusion, or
arthritis.  No joint space narrowing.
IMPRESSION: Normal exam.

## 2012-11-29 ENCOUNTER — Encounter: Payer: Self-pay | Admitting: Sports Medicine

## 2012-11-29 ENCOUNTER — Ambulatory Visit (INDEPENDENT_AMBULATORY_CARE_PROVIDER_SITE_OTHER): Payer: BC Managed Care – PPO

## 2012-11-29 ENCOUNTER — Ambulatory Visit (INDEPENDENT_AMBULATORY_CARE_PROVIDER_SITE_OTHER): Payer: BC Managed Care – PPO | Admitting: Sports Medicine

## 2012-11-29 VITALS — BP 114/78 | HR 84 | Wt 195.0 lb

## 2012-11-29 DIAGNOSIS — M19019 Primary osteoarthritis, unspecified shoulder: Secondary | ICD-10-CM

## 2012-11-29 DIAGNOSIS — M19012 Primary osteoarthritis, left shoulder: Secondary | ICD-10-CM

## 2012-11-29 DIAGNOSIS — M25519 Pain in unspecified shoulder: Secondary | ICD-10-CM

## 2012-11-29 DIAGNOSIS — M19011 Primary osteoarthritis, right shoulder: Secondary | ICD-10-CM

## 2012-11-29 MED ORDER — ACETAMINOPHEN-CODEINE 300-30 MG PO TABS: 1.0000 | ORAL_TABLET | Freq: Four times a day (QID) | ORAL | Status: DC | PRN

## 2012-11-29 NOTE — Assessment & Plan Note (Signed)
Bilateral glenohumeral joint injections under guidance. Return in one month to see if things are going, can certainly consider converting to subacromial injections if not a sufficient response.

## 2012-11-29 NOTE — Progress Notes (Signed)
  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 The pain is localized at the joint line, worse with most movements, or his associate grinding, pain does not radiate, moderate.  Right shoulder pain: Similar as above, localized, no radiation, also with a grinding.  Past medical history, Surgical history, Family history not pertinant except as noted below, Social history, Allergies, and medications have been entered into the medical record, reviewed, and no changes needed.   Review of Systems: No fevers, chills, night sweats, weight loss, chest pain, or shortness of breath.   Objective:    General: Well Developed, well nourished, and in no acute distress.  Neuro: Alert and oriented x3, extra-ocular muscles intact, sensation grossly intact.  HEENT: Normocephalic, atraumatic, pupils equal round reactive to light, neck supple, no masses, no lymphadenopathy, thyroid nonpalpable.  Skin: Warm and dry, no rashes. Cardiac: Regular rate and rhythm, no murmurs rubs or gallops, no lower extremity edema.  Respiratory: Clear to auscultation bilaterally. Not using accessory muscles, speaking in full sentences.  Procedure: Real-time Ultrasound Guided Injection of left glenohumeral joint Device: GE Logiq E  Verbal informed consent obtained.  Time-out conducted.  Noted no overlying erythema, induration, or other signs of local infection.  Skin prepped in a sterile fashion.  Local anesthesia: Topical Ethyl chloride.  With sterile technique and under real time ultrasound guidance:  1 cc Kenalog 40, 4 cc lidocaine injected easily into the glenohumeral joint. Completed without difficulty  Pain immediately resolved suggesting accurate placement of the medication.  Advised to call if fevers/chills, erythema, induration, drainage, or persistent bleeding.   Images permanently stored and available for review in the ultrasound unit.  Impression: Technically successful ultrasound guided injection.  Procedure: Real-time Ultrasound Guided Injection of right glenohumeral joint Device: GE Logiq E  Verbal informed consent obtained.  Time-out conducted.  Noted no overlying erythema, induration, or other signs of local infection.  Skin prepped in a sterile fashion.  Local anesthesia: Topical Ethyl chloride.  With sterile technique and under real time ultrasound guidance:  1 cc Kenalog 40, 4 cc lidocaine injected easily into the glenohumeral joint. Completed without difficulty  Pain immediately resolved suggesting accurate placement of the medication.  Advised to call if fevers/chills, erythema, induration, drainage, or persistent bleeding.  Images permanently stored and available for review in the ultrasound unit.  Impression: Technically successful ultrasound guided injection. Impression and Recommendations:

## 2012-12-04 ENCOUNTER — Ambulatory Visit (INDEPENDENT_AMBULATORY_CARE_PROVIDER_SITE_OTHER): Payer: BC Managed Care – PPO

## 2012-12-04 ENCOUNTER — Encounter: Payer: Self-pay | Admitting: Family Medicine

## 2012-12-04 ENCOUNTER — Ambulatory Visit (INDEPENDENT_AMBULATORY_CARE_PROVIDER_SITE_OTHER): Payer: BC Managed Care – PPO | Admitting: Family Medicine

## 2012-12-04 VITALS — BP 115/80 | HR 99 | Ht 63.0 in | Wt 191.0 lb

## 2012-12-04 DIAGNOSIS — N183 Chronic kidney disease, stage 3 unspecified: Secondary | ICD-10-CM

## 2012-12-04 DIAGNOSIS — R053 Chronic cough: Secondary | ICD-10-CM

## 2012-12-04 DIAGNOSIS — E785 Hyperlipidemia, unspecified: Secondary | ICD-10-CM

## 2012-12-04 DIAGNOSIS — R059 Cough, unspecified: Secondary | ICD-10-CM

## 2012-12-04 DIAGNOSIS — K7689 Other specified diseases of liver: Secondary | ICD-10-CM

## 2012-12-04 DIAGNOSIS — I129 Hypertensive chronic kidney disease with stage 1 through stage 4 chronic kidney disease, or unspecified chronic kidney disease: Secondary | ICD-10-CM

## 2012-12-04 DIAGNOSIS — R05 Cough: Secondary | ICD-10-CM

## 2012-12-04 DIAGNOSIS — N1832 Chronic kidney disease, stage 3b: Secondary | ICD-10-CM | POA: Insufficient documentation

## 2012-12-04 DIAGNOSIS — K760 Fatty (change of) liver, not elsewhere classified: Secondary | ICD-10-CM

## 2012-12-04 DIAGNOSIS — I10 Essential (primary) hypertension: Secondary | ICD-10-CM

## 2012-12-04 LAB — POCT UA - MICROALBUMIN: Microalbumin Ur, POC: 30 mg/L

## 2012-12-04 MED ORDER — LOSARTAN POTASSIUM-HCTZ 50-12.5 MG PO TABS: 1.0000 | ORAL_TABLET | Freq: Every day | ORAL | Status: DC

## 2012-12-04 MED ORDER — ATORVASTATIN CALCIUM 40 MG PO TABS: 40.0000 mg | ORAL_TABLET | Freq: Every day | ORAL | Status: DC

## 2012-12-04 NOTE — Patient Instructions (Signed)
We will recheck your lipids in 2-3 months once started the new lipitor at bedtime for your cholesterol.

## 2012-12-04 NOTE — Addendum Note (Signed)
Addended by: Deno Etienne on: 12/04/2012 04:58 PM   Modules accepted: Orders

## 2012-12-04 NOTE — Progress Notes (Signed)
  Subjective:    Patient ID: Brianna Riley, female    DOB: 19-Apr-1956, 57 y.o.   MRN: [id number]  HPI Fatty liver - Says she is feeling well. No abdominal pain. She says overall she feels she eats healthy but does occasionally make bad choices. No regular exercise.  Hyperlipidemia-she says she takes her statin every night and takes it consistently. She denies any problems or side effects on the medication.  Hypertension-no chest pain or short of breath. No side effects with the medication. No lower extremity edema. Though she says occasionally she will swollen the summer if it's a really hot day and she's been sitting for a prolonged period.  Cough x 1 year.  Cough is more dry at night.  She will occ get lightheaded when goes up or down stairs. Occ wet cough.  Former smoker. No other worsening or alleviating symptoms. She does occasionally have some postnasal drip. No sore throat. No fever. Sometimes the cough is actually dry.   Review of Systems     Objective:   Physical Exam  Constitutional: She is oriented to person, place, and time. She appears well-developed and well-nourished.  HENT:  Head: Normocephalic and atraumatic.  Cardiovascular: Normal rate, regular rhythm and normal heart sounds.   No carotid bruits.   Pulmonary/Chest: Effort normal and breath sounds normal.  Musculoskeletal: She exhibits no edema.  Neurological: She is alert and oriented to person, place, and time.  Skin: Skin is warm and dry.  Psychiatric: She has a normal mood and affect. Her behavior is normal.          Assessment & Plan:  Fatty liver - her liver enzymes have come down nicely on her last set of lab work. This is fantastic. Encouraged her to continue to work on low-fat low-cholesterol diet regular exercise and weight loss.  Hyperlpidemia-her lipids were significantly elevated. She reports that she's extremely consistent with her statin and takes it every night at bedtime. Since she is compliant  then we will need to change her to a more potent statin. Will change to Lipitor 40 mg. We'll repeat lipids in about 2-3 months.  Cough - could be from the medication. I would also like to get a chest x-ray that her cough has been persistent and she is a former smoker. The last chest x-ray system was 2011. We will change her ACE inhibitor to an ARB. I let her know that if it is the medication causing her symptoms then it can take up to a month for the cough to resolve. Also consider reflux and postnasal drip if not improving.  Hypertension-well-controlled. Continue current regimen. Followup in 6 months.  Chronic disease, stage III-I. would like to set her up for a renal ultrasound as well as her urine micron been in today. Follow kidney function every 6 months and she will be due in October.

## 2012-12-10 ENCOUNTER — Ambulatory Visit (INDEPENDENT_AMBULATORY_CARE_PROVIDER_SITE_OTHER): Payer: BC Managed Care – PPO

## 2012-12-10 DIAGNOSIS — N183 Chronic kidney disease, stage 3 unspecified: Secondary | ICD-10-CM

## 2012-12-28 ENCOUNTER — Ambulatory Visit (INDEPENDENT_AMBULATORY_CARE_PROVIDER_SITE_OTHER): Payer: BC Managed Care – PPO | Admitting: Sports Medicine

## 2012-12-28 ENCOUNTER — Ambulatory Visit (INDEPENDENT_AMBULATORY_CARE_PROVIDER_SITE_OTHER): Payer: BC Managed Care – PPO

## 2012-12-28 ENCOUNTER — Encounter: Payer: Self-pay | Admitting: Sports Medicine

## 2012-12-28 VITALS — BP 119/77 | HR 90 | Wt 196.0 lb

## 2012-12-28 DIAGNOSIS — M5412 Radiculopathy, cervical region: Secondary | ICD-10-CM

## 2012-12-28 DIAGNOSIS — M19019 Primary osteoarthritis, unspecified shoulder: Secondary | ICD-10-CM

## 2012-12-28 DIAGNOSIS — M47812 Spondylosis without myelopathy or radiculopathy, cervical region: Secondary | ICD-10-CM

## 2012-12-28 DIAGNOSIS — M19011 Primary osteoarthritis, right shoulder: Secondary | ICD-10-CM

## 2012-12-28 MED ORDER — PREDNISONE 50 MG PO TABS: ORAL_TABLET | ORAL | Status: DC

## 2012-12-28 MED ORDER — MELOXICAM 15 MG PO TABS: ORAL_TABLET | ORAL | Status: DC

## 2012-12-28 MED ORDER — CYCLOBENZAPRINE HCL 10 MG PO TABS: ORAL_TABLET | ORAL | Status: DC

## 2012-12-28 NOTE — Assessment & Plan Note (Addendum)
Left C7. Formal physical therapy, prednisone, flexeril at night. X-rays. Return in one month.  If no better, we'll get an MRI for interventional injection planning.

## 2012-12-28 NOTE — Progress Notes (Signed)
  Subjective:    CC: Followup  HPI: Bilateral glenohumeral arthritis: Continues to be resolved after bilateral glenohumeral joint injections one month ago.   Neck pain: Left-sided, worse with neck extension, and rotation to the left, radiating into the upper shoulder, down the posterior aspect of the arm, forearm, to the second through fourth digits.  It is moderate, persistent.  Past medical history, Surgical history, Family history not pertinant except as noted below, Social history, Allergies, and medications have been entered into the medical record, reviewed, and no changes needed.   Review of Systems: No fevers, chills, night sweats, weight loss, chest pain, or shortness of breath.   Objective:    General: Well Developed, well nourished, and in no acute distress.  Neuro: Alert and oriented x3, extra-ocular muscles intact, sensation grossly intact.  HEENT: Normocephalic, atraumatic, pupils equal round reactive to light, neck supple, no masses, no lymphadenopathy, thyroid nonpalpable.  Skin: Warm and dry, no rashes. Cardiac: Regular rate and rhythm, no murmurs rubs or gallops, no lower extremity edema.  Respiratory: Clear to auscultation bilaterally. Not using accessory muscles, speaking in full sentences. Neck: Inspection unremarkable. No palpable stepoffs. Negative Spurling's maneuver. Full neck range of motion Grip strength and sensation normal in bilateral hands Strength good C4 to T1 distribution No sensory change to C4 to T1 Mildly hyperreflexic triceps, biceps, and brachioradialis. Negative Hoffman sign bilaterally.  Impression and Recommendations:

## 2012-12-28 NOTE — Assessment & Plan Note (Signed)
Resolved after bilateral glenohumeral injections. Return as needed for this.

## 2013-01-02 ENCOUNTER — Ambulatory Visit: Payer: BC Managed Care – PPO | Admitting: Physical Therapy

## 2013-01-11 ENCOUNTER — Ambulatory Visit: Payer: BC Managed Care – PPO | Admitting: Physical Therapy

## 2013-01-23 ENCOUNTER — Ambulatory Visit: Payer: BC Managed Care – PPO | Admitting: Physical Therapy

## 2013-01-25 ENCOUNTER — Other Ambulatory Visit: Payer: Self-pay | Admitting: Family Medicine

## 2013-01-25 DIAGNOSIS — Z1231 Encounter for screening mammogram for malignant neoplasm of breast: Secondary | ICD-10-CM

## 2013-01-30 ENCOUNTER — Ambulatory Visit: Payer: BC Managed Care – PPO | Admitting: Physical Therapy

## 2013-01-30 DIAGNOSIS — M2560 Stiffness of unspecified joint, not elsewhere classified: Secondary | ICD-10-CM

## 2013-01-30 DIAGNOSIS — M542 Cervicalgia: Secondary | ICD-10-CM

## 2013-01-30 DIAGNOSIS — M5412 Radiculopathy, cervical region: Secondary | ICD-10-CM

## 2013-01-30 DIAGNOSIS — M6281 Muscle weakness (generalized): Secondary | ICD-10-CM

## 2013-02-01 ENCOUNTER — Ambulatory Visit: Payer: BC Managed Care – PPO | Admitting: Sports Medicine

## 2013-02-06 ENCOUNTER — Encounter: Payer: BC Managed Care – PPO | Admitting: Physical Therapy

## 2013-02-08 ENCOUNTER — Encounter: Payer: BC Managed Care – PPO | Admitting: Physical Therapy

## 2013-02-11 ENCOUNTER — Encounter: Payer: BC Managed Care – PPO | Admitting: Physical Therapy

## 2013-02-13 ENCOUNTER — Encounter: Payer: BC Managed Care – PPO | Admitting: Physical Therapy

## 2013-02-14 ENCOUNTER — Ambulatory Visit: Payer: BC Managed Care – PPO

## 2013-02-26 ENCOUNTER — Ambulatory Visit: Payer: BC Managed Care – PPO

## 2013-02-28 ENCOUNTER — Ambulatory Visit: Payer: BC Managed Care – PPO

## 2013-03-07 ENCOUNTER — Ambulatory Visit: Payer: BC Managed Care – PPO

## 2013-03-08 ENCOUNTER — Ambulatory Visit: Payer: BC Managed Care – PPO | Admitting: Sports Medicine

## 2013-03-08 ENCOUNTER — Encounter: Payer: Self-pay | Admitting: Family Medicine

## 2013-03-08 ENCOUNTER — Ambulatory Visit (INDEPENDENT_AMBULATORY_CARE_PROVIDER_SITE_OTHER): Payer: BC Managed Care – PPO | Admitting: Family Medicine

## 2013-03-08 VITALS — BP 136/87 | HR 100 | Wt 194.0 lb

## 2013-03-08 DIAGNOSIS — E785 Hyperlipidemia, unspecified: Secondary | ICD-10-CM

## 2013-03-08 DIAGNOSIS — R42 Dizziness and giddiness: Secondary | ICD-10-CM

## 2013-03-08 DIAGNOSIS — R519 Headache, unspecified: Secondary | ICD-10-CM

## 2013-03-08 DIAGNOSIS — R51 Headache: Secondary | ICD-10-CM

## 2013-03-08 DIAGNOSIS — M19019 Primary osteoarthritis, unspecified shoulder: Secondary | ICD-10-CM

## 2013-03-08 DIAGNOSIS — I10 Essential (primary) hypertension: Secondary | ICD-10-CM

## 2013-03-08 DIAGNOSIS — M19011 Primary osteoarthritis, right shoulder: Secondary | ICD-10-CM

## 2013-03-08 LAB — CBC WITH DIFFERENTIAL/PLATELET
Eosinophils Absolute: 0.2 10*3/uL (ref 0.0–0.7)
Eosinophils Relative: 3 % (ref 0–5)
Hemoglobin: 13.6 g/dL (ref 12.0–15.0)
Lymphocytes Relative: 38 % (ref 12–46)
Lymphs Abs: 2 10*3/uL (ref 0.7–4.0)
MCV: 90.2 fL (ref 78.0–100.0)
Monocytes Relative: 9 % (ref 3–12)
Platelets: 302 10*3/uL (ref 150–400)
RBC: 4.49 MIL/uL (ref 3.87–5.11)
WBC: 5.4 10*3/uL (ref 4.0–10.5)

## 2013-03-08 LAB — TSH: TSH: 1.615 u[IU]/mL (ref 0.350–4.500)

## 2013-03-08 LAB — COMPLETE METABOLIC PANEL WITH GFR
AST: 39 U/L — ABNORMAL HIGH (ref 0–37)
Albumin: 4.2 g/dL (ref 3.5–5.2)
Alkaline Phosphatase: 116 U/L (ref 39–117)
BUN: 24 mg/dL — ABNORMAL HIGH (ref 6–23)
Creat: 1.4 mg/dL — ABNORMAL HIGH (ref 0.50–1.10)
GFR, Est African American: 48 mL/min — ABNORMAL LOW
GFR, Est Non African American: 42 mL/min — ABNORMAL LOW
Glucose, Bld: 101 mg/dL — ABNORMAL HIGH (ref 70–99)
Total Bilirubin: 0.4 mg/dL (ref 0.3–1.2)

## 2013-03-08 LAB — LIPID PANEL
Cholesterol: 198 mg/dL (ref 0–200)
HDL: 72 mg/dL (ref >39)
Total CHOL/HDL Ratio: 2.8 Ratio
Triglycerides: 144 mg/dL (ref <150)
VLDL: 29 mg/dL (ref 0–40)

## 2013-03-08 MED ORDER — ACETAMINOPHEN-CODEINE 300-30 MG PO TABS: 1.0000 | ORAL_TABLET | Freq: Four times a day (QID) | ORAL | Status: DC | PRN

## 2013-03-08 MED ORDER — ATORVASTATIN CALCIUM 40 MG PO TABS: 40.0000 mg | ORAL_TABLET | Freq: Every day | ORAL | Status: DC

## 2013-03-08 MED ORDER — LOSARTAN POTASSIUM-HCTZ 50-12.5 MG PO TABS: 1.0000 | ORAL_TABLET | Freq: Every day | ORAL | Status: DC

## 2013-03-08 NOTE — Progress Notes (Signed)
  Subjective:    Patient ID: Brianna Riley, female    DOB: 23-Dec-1955, 57 y.o.   MRN: [id number]  HPI HTN-  Pt denies chest pain, SOB, dizziness, or heart palpitations.  Taking meds as directed w/o problems.  Denies medication side effects. Has been really dizzy. She says she will get him as lightheaded. Says it started a couple months ago. No known triggers. For example she says it happened tonight while she was cooking dinner. She has not checked her blood pressure or pulse with happens. Though she did check her blood pressure on a separate day when she was asymptomatic and says her systolic pressure was around 99. No colds or ear pain. She had a cough for approximately one year and says that has finally resolved.  Takes ASA daily.  Gets SOB going up and down stairs for about a year.  No regular exercise. She is obese.   Hyperlipidemia-switch her to Lipitor last time she was here to get better control over her lipids. She's due for repeat lipid panel.  She think she may be intolerant to gluten. She says every time she eats possibly she gets a severe headache. She denies any GI symptoms. Says she is able to eat bread withoutproblemsordifficulty.  Review of Systems     Objective:   Physical Exam  Constitutional: She is oriented to person, place, and time. She appears well-developed and well-nourished.  HENT:  Head: Normocephalic and atraumatic.  Right Ear: External ear normal.  Left Ear: External ear normal.  Nose: Nose normal.  Mouth/Throat: Oropharynx is clear and moist.  TMs and canals are clear.   Eyes: Conjunctivae and EOM are normal. Pupils are equal, round, and reactive to light.  Neck: Neck supple. No thyromegaly present.  Cardiovascular: Normal rate, regular rhythm and normal heart sounds.   Pulmonary/Chest: Effort normal and breath sounds normal. She has no wheezes.  Lymphadenopathy:    She has no cervical adenopathy.  Neurological: She is alert and oriented to person, place,  and time.  Skin: Skin is warm and dry.  Psychiatric: She has a normal mood and affect. Her behavior is normal.          Assessment & Plan:  HTN- Well controlled. F/U in 6 months.    Hyperlipidemia- continue current regimen. Given lab slip to repeat cholesterol.  Dizziness - will check a TSH, check CBC to evaluate for anemia. Her ear exam is normal. Today her blood pressure and pulse look fantastic. I encouraged her to check her blood pressure and pulse next time this happens to see if she's becoming hypotensive or possibly bradycardic. She is to call me with these vital signs after it happens again.  I did do an EKG today. It showed normal sinus rhythm at 90 beats per minute with left axis deviation and right bundle branch block. I did look back at her old EKGs and she had evidence of right bundle branch block back in 2010, approximately 5 years ago. Thus EKG unchanged from prior.  Headache - Will test for gluten and she is getting a headache after she eats pasta. She doesn't have any significant GI symptoms.Marland Kitchen

## 2013-03-11 ENCOUNTER — Other Ambulatory Visit: Payer: Self-pay | Admitting: *Deleted

## 2013-03-11 ENCOUNTER — Other Ambulatory Visit: Payer: Self-pay | Admitting: Family Medicine

## 2013-03-11 DIAGNOSIS — I10 Essential (primary) hypertension: Secondary | ICD-10-CM

## 2013-03-11 LAB — GLIA (IGA/G) + TTG IGA: Gliadin IgG: 3.4 U/mL (ref <20)

## 2013-03-11 MED ORDER — LOSARTAN POTASSIUM 100 MG PO TABS: 100.0000 mg | ORAL_TABLET | Freq: Every day | ORAL | Status: DC

## 2013-03-12 ENCOUNTER — Telehealth: Payer: Self-pay | Admitting: *Deleted

## 2013-03-12 DIAGNOSIS — R42 Dizziness and giddiness: Secondary | ICD-10-CM

## 2013-03-14 ENCOUNTER — Telehealth: Payer: Self-pay | Admitting: *Deleted

## 2013-03-14 DIAGNOSIS — R42 Dizziness and giddiness: Secondary | ICD-10-CM

## 2013-05-10 ENCOUNTER — Ambulatory Visit: Payer: BC Managed Care – PPO | Admitting: Family Medicine

## 2013-05-16 ENCOUNTER — Other Ambulatory Visit: Payer: Self-pay | Admitting: *Deleted

## 2013-05-16 ENCOUNTER — Ambulatory Visit: Payer: BC Managed Care – PPO | Admitting: Family Medicine

## 2013-05-16 DIAGNOSIS — I10 Essential (primary) hypertension: Secondary | ICD-10-CM

## 2013-05-17 LAB — BASIC METABOLIC PANEL
BUN: 24 mg/dL — ABNORMAL HIGH (ref 6–23)
CO2: 27 mEq/L (ref 19–32)
Calcium: 10.2 mg/dL (ref 8.4–10.5)
Chloride: 102 mEq/L (ref 96–112)
Glucose, Bld: 87 mg/dL (ref 70–99)
Sodium: 143 mEq/L (ref 135–145)

## 2013-05-20 ENCOUNTER — Ambulatory Visit: Payer: BC Managed Care – PPO | Admitting: Family Medicine

## 2013-05-20 ENCOUNTER — Ambulatory Visit: Payer: BC Managed Care – PPO | Admitting: Sports Medicine

## 2013-05-23 ENCOUNTER — Ambulatory Visit: Payer: BC Managed Care – PPO | Admitting: Sports Medicine

## 2013-05-23 ENCOUNTER — Ambulatory Visit: Payer: BC Managed Care – PPO | Admitting: Family Medicine

## 2013-06-04 ENCOUNTER — Ambulatory Visit: Payer: BC Managed Care – PPO | Admitting: Family Medicine

## 2013-06-04 ENCOUNTER — Ambulatory Visit: Payer: BC Managed Care – PPO | Admitting: Sports Medicine

## 2013-06-19 ENCOUNTER — Telehealth: Payer: Self-pay | Admitting: *Deleted

## 2013-06-19 NOTE — Telephone Encounter (Signed)
Pt called requesting refill of her meds but did not indicate what meds she needed refills for. Returned her call and there was no answer.Loralee PacasBarkley, [name] La PuenteLynetta

## 2013-06-25 ENCOUNTER — Ambulatory Visit (INDEPENDENT_AMBULATORY_CARE_PROVIDER_SITE_OTHER): Payer: BC Managed Care – PPO | Admitting: Sports Medicine

## 2013-06-25 ENCOUNTER — Encounter: Payer: Self-pay | Admitting: Family Medicine

## 2013-06-25 ENCOUNTER — Encounter: Payer: Self-pay | Admitting: Sports Medicine

## 2013-06-25 ENCOUNTER — Ambulatory Visit (INDEPENDENT_AMBULATORY_CARE_PROVIDER_SITE_OTHER): Payer: BC Managed Care – PPO | Admitting: Family Medicine

## 2013-06-25 VITALS — BP 135/87 | HR 104 | Ht 64.0 in | Wt 196.0 lb

## 2013-06-25 DIAGNOSIS — M19011 Primary osteoarthritis, right shoulder: Secondary | ICD-10-CM

## 2013-06-25 DIAGNOSIS — M5412 Radiculopathy, cervical region: Secondary | ICD-10-CM

## 2013-06-25 DIAGNOSIS — M19012 Primary osteoarthritis, left shoulder: Principal | ICD-10-CM

## 2013-06-25 DIAGNOSIS — I10 Essential (primary) hypertension: Secondary | ICD-10-CM

## 2013-06-25 DIAGNOSIS — M19019 Primary osteoarthritis, unspecified shoulder: Secondary | ICD-10-CM

## 2013-06-25 DIAGNOSIS — E669 Obesity, unspecified: Secondary | ICD-10-CM

## 2013-06-25 MED ORDER — LOSARTAN POTASSIUM 100 MG PO TABS: 100.0000 mg | ORAL_TABLET | Freq: Every day | ORAL | Status: DC

## 2013-06-25 MED ORDER — ATORVASTATIN CALCIUM 40 MG PO TABS: 40.0000 mg | ORAL_TABLET | Freq: Every day | ORAL | Status: DC

## 2013-06-25 MED ORDER — HYDROCODONE-ACETAMINOPHEN 5-325 MG PO TABS: 1.0000 | ORAL_TABLET | Freq: Three times a day (TID) | ORAL | Status: DC | PRN

## 2013-06-25 NOTE — Assessment & Plan Note (Signed)
Excellent response to bilateral glenohumeral joint injection 7 months ago. Repeat bilateral glenohumeral injections today. Return as needed.

## 2013-06-25 NOTE — Progress Notes (Signed)
   Subjective:    Patient ID: Brianna Riley, female    DOB: 1956-03-02, 58 y.o.   MRN: [id number]  HPI Hypertension- Pt denies chest pain, SOB, dizziness, or heart palpitations.  Taking meds as directed w/o problems.  Denies medication side effects.  She ranout of BP meds about 2 days. Ago. We had stopped her HCTZ bc fo inc renal function.    Lab Results  Component Value Date   CHOL 198 03/08/2013   HDL 72 03/08/2013   LDLCALC 97 03/08/2013   LDLDIRECT 159.2 09/06/2006   TRIG 144 03/08/2013   CHOLHDL 2.8 03/08/2013   Obesity - dicussed that she is uphappy with her weight.  She feels overall she makes good healthy choices. She does not tolerate Calan. She's not currently getting any exercise.  Review of Systems     Objective:   Physical Exam  Constitutional: She is oriented to person, place, and time. She appears well-developed and well-nourished.  HENT:  Head: Normocephalic and atraumatic.  Cardiovascular: Normal rate, regular rhythm and normal heart sounds.   Pulmonary/Chest: Effort normal and breath sounds normal.  Neurological: She is alert and oriented to person, place, and time.  Skin: Skin is warm and dry.  Psychiatric: She has a normal mood and affect. Her behavior is normal.          Assessment & Plan:  HTN - Well controlled off of diuretic.  Will f/u in 6 months.  Rehceck kidneys at that time.  Creatinine did return to normal once we stopped the had chlorothiazide. She also complained of dizziness back in October and says that she noticed that only happened after she ate possibly sauce. She has quit eating not since then and has had relief of her symptoms.  Obesity/BMI 33 - Discussed regular exercise and keeping a food dairy and keeping count of food.  I would really love to see her lose 15-20 pounds between now and the next time I see her.

## 2013-06-25 NOTE — Assessment & Plan Note (Signed)
Clinically left-sided C7 based on prior symptoms. She does have moderate C6-C7 degenerative changes on x-ray. I am going to proceed with a bilateral glenohumeral injection today, if she still has neck symptoms we will proceed further with intervention of the neck.

## 2013-06-25 NOTE — Addendum Note (Signed)
Addended by: Monica BectonHEKKEKANDAM, Michale Weikel J on: 06/25/2013 03:16 PM   Modules accepted: Orders, Medications

## 2013-06-25 NOTE — Progress Notes (Signed)
  Subjective:    CC: Followup  HPI: Bilateral below humeral arthritis: Last injection was 7 months ago, response, desires repeat. Recurrence of pain at the anterior glenohumeral joint, which with external rotation and abduction, moderate, persistent.  Cervical degenerative disc disease: Pain in the neck, left-sided, radiating down the left arm and a C7 distribution.  Past medical history, Surgical history, Family history not pertinant except as noted below, Social history, Allergies, and medications have been entered into the medical record, reviewed, and no changes needed.   Review of Systems: No fevers, chills, night sweats, weight loss, chest pain, or shortness of breath.   Objective:    General: Well Developed, well nourished, and in no acute distress.  Neuro: Alert and oriented x3, extra-ocular muscles intact, sensation grossly intact.  HEENT: Normocephalic, atraumatic, pupils equal round reactive to light, neck supple, no masses, no lymphadenopathy, thyroid nonpalpable.  Skin: Warm and dry, no rashes. Cardiac: Regular rate and rhythm, no murmurs rubs or gallops, no lower extremity edema.  Respiratory: Clear to auscultation bilaterally. Not using accessory muscles, speaking in full sentences.  Procedure: Real-time Ultrasound Guided Injection of left glenohumeral joint Device: GE Logiq E  Verbal informed consent obtained.  Time-out conducted.  Noted no overlying erythema, induration, or other signs of local infection.  Skin prepped in a sterile fashion.  Local anesthesia: Topical Ethyl chloride.  With sterile technique and under real time ultrasound guidance:  Spinal needle advanced into the humeral joint taking care to avoid the labrum, 1 cc Kenalog 40, 4 cc lidocaine injected easily. Completed without difficulty  Pain immediately resolved suggesting accurate placement of the medication.  Advised to call if fevers/chills, erythema, induration, drainage, or persistent bleeding.    Images permanently stored and available for review in the ultrasound unit.  Impression: Technically successful ultrasound guided injection.  Procedure: Real-time Ultrasound Guided Injection of right glenohumeral joint Device: GE Logiq E  Verbal informed consent obtained.  Time-out conducted.  Noted no overlying erythema, induration, or other signs of local infection.  Skin prepped in a sterile fashion.  Local anesthesia: Topical Ethyl chloride.  With sterile technique and under real time ultrasound guidance:  Spinal needle advanced into the humeral joint taking care to avoid the labrum, 1 cc Kenalog 40, 4 cc lidocaine injected easily. Completed without difficulty  Pain immediately resolved suggesting accurate placement of the medication.  Advised to call if fevers/chills, erythema, induration, drainage, or persistent bleeding.  Images permanently stored and available for review in the ultrasound unit.  Impression: Technically successful ultrasound guided injection.  Impression and Recommendations:

## 2013-08-13 LAB — BASIC METABOLIC PANEL: Glucose: 80 mg/dL

## 2013-08-13 LAB — LIPID PANEL
CHOLESTEROL: 161 mg/dL (ref 0–200)
HDL: 82 mg/dL — AB (ref 35–70)
LDL CALC: 53 mg/dL
Triglycerides: 133 mg/dL (ref 40–160)

## 2013-09-05 ENCOUNTER — Ambulatory Visit: Payer: BC Managed Care – PPO | Admitting: Family Medicine

## 2013-09-10 ENCOUNTER — Telehealth: Payer: Self-pay | Admitting: *Deleted

## 2013-09-10 ENCOUNTER — Encounter: Payer: Self-pay | Admitting: Sports Medicine

## 2013-09-10 ENCOUNTER — Ambulatory Visit (INDEPENDENT_AMBULATORY_CARE_PROVIDER_SITE_OTHER): Payer: BC Managed Care – PPO | Admitting: Family Medicine

## 2013-09-10 ENCOUNTER — Encounter: Payer: Self-pay | Admitting: Family Medicine

## 2013-09-10 ENCOUNTER — Ambulatory Visit (INDEPENDENT_AMBULATORY_CARE_PROVIDER_SITE_OTHER): Payer: BC Managed Care – PPO | Admitting: Sports Medicine

## 2013-09-10 VITALS — BP 116/82 | HR 67

## 2013-09-10 VITALS — BP 121/74 | HR 73 | Ht 64.0 in | Wt 195.0 lb

## 2013-09-10 DIAGNOSIS — M19019 Primary osteoarthritis, unspecified shoulder: Secondary | ICD-10-CM

## 2013-09-10 DIAGNOSIS — I10 Essential (primary) hypertension: Secondary | ICD-10-CM

## 2013-09-10 DIAGNOSIS — M5412 Radiculopathy, cervical region: Secondary | ICD-10-CM

## 2013-09-10 DIAGNOSIS — M19011 Primary osteoarthritis, right shoulder: Secondary | ICD-10-CM

## 2013-09-10 DIAGNOSIS — M19012 Primary osteoarthritis, left shoulder: Principal | ICD-10-CM

## 2013-09-10 DIAGNOSIS — E785 Hyperlipidemia, unspecified: Secondary | ICD-10-CM

## 2013-09-10 DIAGNOSIS — I129 Hypertensive chronic kidney disease with stage 1 through stage 4 chronic kidney disease, or unspecified chronic kidney disease: Secondary | ICD-10-CM

## 2013-09-10 DIAGNOSIS — N183 Chronic kidney disease, stage 3 unspecified: Secondary | ICD-10-CM

## 2013-09-10 MED ORDER — LOSARTAN POTASSIUM 100 MG PO TABS: 100.0000 mg | ORAL_TABLET | Freq: Every day | ORAL | Status: DC

## 2013-09-10 MED ORDER — HYDROCODONE-ACETAMINOPHEN 5-325 MG PO TABS: 1.0000 | ORAL_TABLET | Freq: Three times a day (TID) | ORAL | Status: DC | PRN

## 2013-09-10 MED ORDER — KETOROLAC TROMETHAMINE 30 MG/ML IJ SOLN
30.0000 mg | Freq: Once | INTRAMUSCULAR | Status: AC
  Administered 2013-09-10: 30 mg via INTRAMUSCULAR

## 2013-09-10 MED ORDER — METHYLPREDNISOLONE SODIUM SUCC 125 MG IJ SOLR
125.0000 mg | Freq: Once | INTRAMUSCULAR | Status: AC
  Administered 2013-09-10: 125 mg via INTRAMUSCULAR

## 2013-09-10 MED ORDER — ATORVASTATIN CALCIUM 40 MG PO TABS: 40.0000 mg | ORAL_TABLET | Freq: Every day | ORAL | Status: DC

## 2013-09-10 NOTE — Assessment & Plan Note (Signed)
Doing okay.

## 2013-09-10 NOTE — Telephone Encounter (Signed)
PA for MRI Cervical w/o contrast obtained. Auth # [id number]. Expires 10/09/13.  Meyer CoryMisty Ahmad, LPN

## 2013-09-10 NOTE — Progress Notes (Signed)
  Subjective:    CC:  Neck pain  HPI: Bilateral glenohumeral osteoarthritis: Continues to be pain-free after last injection.  Neck pain: Known multilevel cervical degenerative disc disease, she did have initially left-sided C7 radicular symptoms, moderate, persistent, she has already been through physical therapy, steroids, NSAIDs, muscle relaxers.  Past medical history, Surgical history, Family history not pertinant except as noted below, Social history, Allergies, and medications have been entered into the medical record, reviewed, and no changes needed.   Review of Systems: No fevers, chills, night sweats, weight loss, chest pain, or shortness of breath.   Objective:    General: Well Developed, well nourished, and in no acute distress.  Neuro: Alert and oriented x3, extra-ocular muscles intact, sensation grossly intact.  HEENT: Normocephalic, atraumatic, pupils equal round reactive to light, neck supple, no masses, no lymphadenopathy, thyroid nonpalpable.  Skin: Warm and dry, no rashes. Cardiac: Regular rate and rhythm, no murmurs rubs or gallops, no lower extremity edema.  Respiratory: Clear to auscultation bilaterally. Not using accessory muscles, speaking in full sentences.  Impression and Recommendations:

## 2013-09-10 NOTE — Progress Notes (Signed)
   Subjective:    Patient ID: Brianna Riley, female    DOB: 05/02/1956, 58 y.o.   MRN: [id number]  HPI Hypertension- Pt denies chest pain, SOB, dizziness, or heart palpitations.  Taking meds as directed w/o problems.  Denies medication side effects.    Hyperlipidemia-she recently had cholesterol [facility] on [facility] 10. She was a copy today so we can put this in her system. Her total cholesterol is 161, LDL is 53, HDL is 82 and triglycerides were 133. Looks great.   Review of Systems     Objective:   Physical Exam  Constitutional: She is oriented to person, place, and time. She appears well-developed and well-nourished.  HENT:  Head: Normocephalic and atraumatic.  Cardiovascular: Normal rate, regular rhythm and normal heart sounds.   Pulmonary/Chest: Effort normal and breath sounds normal.  Neurological: She is alert and oriented to person, place, and time.  Skin: Skin is warm and dry.  Psychiatric: She has a normal mood and affect. Her behavior is normal.          Assessment & Plan:  Hypertension-well-controlled on current regimen. Followup in 6 months.   Hyperlipidemia- Well controlled. Will enter into system.   CKD3 - recheck kidneys. Repeat BMP.

## 2013-09-10 NOTE — Assessment & Plan Note (Addendum)
Left C7 with multilevel degenerative changes on x-ray. At this point we are going to proceed with MRI for interventional injection planning. She is having significant pain today, Toradol 30 mg and Solu-Medrol 125 mg intramuscular given in the office.

## 2013-09-11 ENCOUNTER — Other Ambulatory Visit: Payer: Self-pay | Admitting: *Deleted

## 2013-09-11 DIAGNOSIS — R748 Abnormal levels of other serum enzymes: Secondary | ICD-10-CM

## 2013-09-11 LAB — BASIC METABOLIC PANEL WITH GFR
BUN: 18 mg/dL (ref 6–23)
CHLORIDE: 103 meq/L (ref 96–112)
CO2: 29 meq/L (ref 19–32)
Calcium: 9.9 mg/dL (ref 8.4–10.5)
Creat: 1.1 mg/dL (ref 0.50–1.10)
GFR, Est African American: 65 mL/min
GFR, Est Non African American: 56 mL/min — ABNORMAL LOW
GLUCOSE: 93 mg/dL (ref 70–99)
POTASSIUM: 5 meq/L (ref 3.5–5.3)
SODIUM: 144 meq/L (ref 135–145)

## 2013-09-11 LAB — VITAMIN B12: Vitamin B-12: 1132 pg/mL — ABNORMAL HIGH (ref 211–911)

## 2013-09-12 ENCOUNTER — Encounter: Payer: Self-pay | Admitting: *Deleted

## 2013-09-14 ENCOUNTER — Ambulatory Visit (HOSPITAL_BASED_OUTPATIENT_CLINIC_OR_DEPARTMENT_OTHER): Payer: BC Managed Care – PPO

## 2013-09-21 ENCOUNTER — Ambulatory Visit (HOSPITAL_BASED_OUTPATIENT_CLINIC_OR_DEPARTMENT_OTHER): Payer: BC Managed Care – PPO

## 2013-12-27 ENCOUNTER — Telehealth: Payer: Self-pay | Admitting: *Deleted

## 2013-12-27 NOTE — Telephone Encounter (Signed)
Pt called and stated that she feels like she's choking and this has been going on for over 1 week. She feels like something is stuck in her throat. She reports that this has happened to her in the past. I offered her an appt. She would like to be seen on Monday pt informed that if she begins to feel worse over the weekend or after hours to go directly to the ED or UC she voiced understanding and agreed.Loralee PacasBarkley, Darry Kelnhofer [place]

## 2013-12-30 ENCOUNTER — Ambulatory Visit: Payer: BC Managed Care – PPO | Admitting: Sports Medicine

## 2013-12-30 ENCOUNTER — Ambulatory Visit: Payer: BC Managed Care – PPO | Admitting: Family Medicine

## 2014-03-10 ENCOUNTER — Encounter: Payer: BC Managed Care – PPO | Admitting: Family Medicine

## 2014-03-10 ENCOUNTER — Ambulatory Visit: Payer: BC Managed Care – PPO | Admitting: Sports Medicine

## 2014-04-04 ENCOUNTER — Encounter: Payer: Self-pay | Admitting: Sports Medicine

## 2014-04-04 ENCOUNTER — Encounter: Payer: Self-pay | Admitting: Family Medicine

## 2014-04-04 ENCOUNTER — Ambulatory Visit (INDEPENDENT_AMBULATORY_CARE_PROVIDER_SITE_OTHER): Payer: BC Managed Care – PPO | Admitting: Family Medicine

## 2014-04-04 ENCOUNTER — Ambulatory Visit (INDEPENDENT_AMBULATORY_CARE_PROVIDER_SITE_OTHER): Payer: BC Managed Care – PPO | Admitting: Sports Medicine

## 2014-04-04 VITALS — BP 156/84 | HR 81 | Ht 64.0 in | Wt 201.0 lb

## 2014-04-04 VITALS — BP 126/83 | HR 89 | Ht 64.0 in | Wt 201.0 lb

## 2014-04-04 DIAGNOSIS — Z0189 Encounter for other specified special examinations: Secondary | ICD-10-CM | POA: Diagnosis not present

## 2014-04-04 DIAGNOSIS — E785 Hyperlipidemia, unspecified: Secondary | ICD-10-CM | POA: Diagnosis not present

## 2014-04-04 DIAGNOSIS — M5412 Radiculopathy, cervical region: Secondary | ICD-10-CM | POA: Diagnosis not present

## 2014-04-04 DIAGNOSIS — Z1231 Encounter for screening mammogram for malignant neoplasm of breast: Secondary | ICD-10-CM

## 2014-04-04 DIAGNOSIS — Z0000 Encounter for general adult medical examination without abnormal findings: Secondary | ICD-10-CM

## 2014-04-04 MED ORDER — CYCLOBENZAPRINE HCL 10 MG PO TABS
ORAL_TABLET | ORAL | Status: DC
Start: 2014-04-04 — End: 2014-04-04

## 2014-04-04 MED ORDER — CYCLOBENZAPRINE HCL 10 MG PO TABS: ORAL_TABLET | ORAL | Status: DC

## 2014-04-04 MED ORDER — HYDROCODONE-ACETAMINOPHEN 5-325 MG PO TABS: 1.0000 | ORAL_TABLET | Freq: Three times a day (TID) | ORAL | Status: DC | PRN

## 2014-04-04 NOTE — Progress Notes (Signed)
  Subjective:     Brianna Riley is a 58 y.o. female and is here for a comprehensive physical exam. The patient reports no problems.  History   Social History  . Marital Status: Married    Spouse Name: N/A    Number of Children: N/A  . Years of Education: N/A   Occupational History  . UNEMPLOYED   . Retired    Social History Main Topics  . Smoking status: Former Smoker    Quit date: 06/06/1996  . Smokeless tobacco: Not on file  . Alcohol Use: Yes     Comment: Occasional EtOH  . Drug Use: Not on file  . Sexual Activity: No   Other Topics Concern  . 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 Mammogram  02/13/2014  . Influenza Vaccine  01/05/2015  . Pap Smear  01/27/2015  . Colonoscopy  06/06/2017  . Tetanus/tdap  11/06/2017    The following portions of the patient's history were reviewed and updated as appropriate: allergies, current medications, past family history, past medical history, past social history, past surgical history and problem list.  Review of Systems A comprehensive review of systems was negative.   Objective:    BP 156/84  Pulse 81  Ht 5\' 4"  (1.626 m)  Wt 201 lb (91.173 kg)  BMI 34.48 kg/m2 General appearance: alert, cooperative and appears stated age Head: Normocephalic, without obvious abnormality, atraumatic Eyes: conj clear, EOMi, PEERLA Ears: normal TM's and external ear canals both ears Nose: Nares normal. Septum midline. Mucosa normal. No drainage or sinus tenderness. Throat: lips, mucosa, and tongue normal; teeth and gums normal Neck: no adenopathy, no carotid bruit, no JVD, supple, symmetrical, trachea midline and thyroid not enlarged, symmetric, no tenderness/mass/nodules Back: symmetric, no curvature. ROM normal. No  CVA tenderness. Lungs: clear to auscultation bilaterally Breasts: Not perfromed. Patient will go for mammo next week. Heart: regular rate and rhythm, S1, S2 normal, no murmur, click, rub or gallop Abdomen: soft, non-tender; bowel sounds normal; no masses,  no organomegaly Extremities: extremities normal, atraumatic, no cyanosis or edema Pulses: 2+ and symmetric Skin: Skin color, texture, turgor normal. No rashes or lesions Lymph nodes: Cervical, supraclavicular, and axillary nodes normal. Neurologic: Alert and oriented X 3, normal strength and tone. Normal symmetric reflexes. Normal coordination and gait    Assessment:    Healthy female exam.      Plan:     See After Visit Summary for Counseling Recommendations  Keep up a regular exercise program and make sure you are eating a healthy diet Try to eat 4 servings of dairy a day, or if you are lactose intolerant take a calcium with vitamin D daily.  Your vaccines are up to date.  Due for pap smear next year.  Will schedule mammo for next week.   HTN- normally BP is really well controlled. Not sure why elevated.  She says nothing has really changed. Will recheck today.  She has gained 6 lbs in the last 6 months.

## 2014-04-04 NOTE — Patient Instructions (Signed)
Keep up a regular exercise program and make sure you are eating a healthy diet Try to eat 4 servings of dairy a day, or if you are lactose intolerant take a calcium with vitamin D daily.  Your vaccines are up to date.   

## 2014-04-04 NOTE — Progress Notes (Signed)
  Subjective:    CC: Follow-up  HPI: Left cervical radiculitis: Clinically C7, unfortunately has not yet had her MRI. She does need some more Flexeril.  Past medical history, Surgical history, Family history not pertinant except as noted below, Social history, Allergies, and medications have been entered into the medical record, reviewed, and no changes needed.   Review of Systems: No fevers, chills, night sweats, weight loss, chest pain, or shortness of breath.   Objective:    General: Well Developed, well nourished, and in no acute distress.  Neuro: Alert and oriented x3, extra-ocular muscles intact, sensation grossly intact.  HEENT: Normocephalic, atraumatic, pupils equal round reactive to light, neck supple, no masses, no lymphadenopathy, thyroid nonpalpable.  Skin: Warm and dry, no rashes. Cardiac: Regular rate and rhythm, no murmurs rubs or gallops, no lower extremity edema.  Respiratory: Clear to auscultation bilaterally. Not using accessory muscles, speaking in full sentences. Neck: Negative spurling's Full neck range of motion Grip strength and sensation normal in bilateral hands Strength good C4 to T1 distribution No sensory change to C4 to T1 Reflexes normal  Impression and Recommendations:

## 2014-04-04 NOTE — Assessment & Plan Note (Signed)
Still awaiting MRI. We will do the MRI on Monday, she will return to see me for interventional injection planning. Persistent left-sided C7 radiculopathy.

## 2014-04-05 LAB — COMPLETE METABOLIC PANEL WITH GFR
ALT: 44 U/L — ABNORMAL HIGH (ref 0–35)
AST: 53 U/L — ABNORMAL HIGH (ref 0–37)
Albumin: 4.1 g/dL (ref 3.5–5.2)
Alkaline Phosphatase: 127 U/L — ABNORMAL HIGH (ref 39–117)
BUN: 20 mg/dL (ref 6–23)
CHLORIDE: 106 meq/L (ref 96–112)
CO2: 29 meq/L (ref 19–32)
Calcium: 9.4 mg/dL (ref 8.4–10.5)
Creat: 1.06 mg/dL (ref 0.50–1.10)
GFR, EST AFRICAN AMERICAN: 67 mL/min
GFR, EST NON AFRICAN AMERICAN: 58 mL/min — AB
GLUCOSE: 80 mg/dL (ref 70–99)
Potassium: 4.5 mEq/L (ref 3.5–5.3)
Sodium: 145 mEq/L (ref 135–145)
Total Bilirubin: 0.3 mg/dL (ref 0.2–1.2)
Total Protein: 6.4 g/dL (ref 6.0–8.3)

## 2014-04-05 LAB — LIPID PANEL
Cholesterol: 175 mg/dL (ref 0–200)
HDL: 79 mg/dL (ref >39)
LDL Cholesterol: 77 mg/dL (ref 0–99)
Total CHOL/HDL Ratio: 2.2 Ratio
Triglycerides: 97 mg/dL (ref <150)
VLDL: 19 mg/dL (ref 0–40)

## 2014-04-05 LAB — VITAMIN B12: VITAMIN B 12: 868 pg/mL (ref 211–911)

## 2014-04-06 ENCOUNTER — Encounter: Payer: Self-pay | Admitting: Family Medicine

## 2014-04-06 DIAGNOSIS — K760 Fatty (change of) liver, not elsewhere classified: Secondary | ICD-10-CM | POA: Insufficient documentation

## 2014-04-07 ENCOUNTER — Other Ambulatory Visit: Payer: BC Managed Care – PPO

## 2014-04-08 ENCOUNTER — Telehealth: Payer: Self-pay | Admitting: *Deleted

## 2014-04-08 NOTE — Telephone Encounter (Signed)
[facility] [employer] [facility] cervical MRI WO contrast.  Approval dates are 11/03-12/02.  Auth # [id number].

## 2014-04-14 ENCOUNTER — Ambulatory Visit (INDEPENDENT_AMBULATORY_CARE_PROVIDER_SITE_OTHER): Payer: BC Managed Care – PPO

## 2014-04-14 DIAGNOSIS — M2578 Osteophyte, vertebrae: Secondary | ICD-10-CM

## 2014-04-14 DIAGNOSIS — M5412 Radiculopathy, cervical region: Secondary | ICD-10-CM

## 2014-04-14 DIAGNOSIS — M4803 Spinal stenosis, cervicothoracic region: Secondary | ICD-10-CM

## 2014-04-14 DIAGNOSIS — E0789 Other specified disorders of thyroid: Secondary | ICD-10-CM

## 2014-04-17 ENCOUNTER — Ambulatory Visit (INDEPENDENT_AMBULATORY_CARE_PROVIDER_SITE_OTHER): Payer: BC Managed Care – PPO

## 2014-04-17 DIAGNOSIS — Z1231 Encounter for screening mammogram for malignant neoplasm of breast: Secondary | ICD-10-CM

## 2014-04-23 ENCOUNTER — Telehealth: Payer: Self-pay | Admitting: *Deleted

## 2014-04-23 NOTE — Telephone Encounter (Signed)
Pt called wanting MRI results and pain medication.left a message with her to schedule an appt to discuss medication options and go over MRI results

## 2014-04-24 ENCOUNTER — Telehealth: Payer: Self-pay

## 2014-04-24 MED ORDER — HYDROCODONE-ACETAMINOPHEN 5-325 MG PO TABS: 1.0000 | ORAL_TABLET | Freq: Three times a day (TID) | ORAL | Status: DC | PRN

## 2014-04-24 NOTE — Telephone Encounter (Signed)
Left message on patient home vm to call me back regarding pain medication and instructions. Rhonda Cunningham,CMA

## 2014-04-24 NOTE — Telephone Encounter (Signed)
Rx given, still needs to f/u re:mri results, likely needs an injection/epidural.  Hydrocodone will only be short term, no further refills from me until after followup and interventional treatment.

## 2014-04-24 NOTE — Telephone Encounter (Signed)
Patient called requested a refill for Hydrocodone. Rhonda Cunningham,CMA

## 2014-04-28 ENCOUNTER — Ambulatory Visit: Payer: BC Managed Care – PPO | Admitting: Sports Medicine

## 2014-04-29 NOTE — Telephone Encounter (Signed)
Left detailed message on patient vm with instructions as noted below also advised patient that no more Hydrocodone will be prescribed without a follow up appt for MRI results and interventional treatment. Rhonda Cunningham,CMA

## 2014-05-06 ENCOUNTER — Encounter: Payer: Self-pay | Admitting: Sports Medicine

## 2014-05-06 ENCOUNTER — Ambulatory Visit (INDEPENDENT_AMBULATORY_CARE_PROVIDER_SITE_OTHER): Payer: BC Managed Care – PPO | Admitting: Sports Medicine

## 2014-05-06 DIAGNOSIS — M5412 Radiculopathy, cervical region: Secondary | ICD-10-CM

## 2014-05-06 MED ORDER — DIAZEPAM 5 MG PO TABS: ORAL_TABLET | ORAL | Status: DC

## 2014-05-06 MED ORDER — HYDROCODONE-ACETAMINOPHEN 7.5-325 MG PO TABS: 1.0000 | ORAL_TABLET | Freq: Three times a day (TID) | ORAL | Status: DC | PRN

## 2014-05-06 NOTE — Assessment & Plan Note (Signed)
Multilevel cervical degenerative disc disease at the C4-5 C5-6 and C6-7 levels. Symptoms are classic for left-sided C7 radiculopathy. We are going to proceed with a left-sided C6-C7 interlaminar epidural injection. Return to see me one month after the injection to evaluate response.

## 2014-05-06 NOTE — Progress Notes (Signed)
  Subjective:    CC: MRI results  HPI: Left C7 radiculopathy: Pain is moderate, persistent, radiating down the left arm and a C7 distribution, she has failed conservative measures.no trauma, no constitutional symptoms. No bowel or bladder dysfunction.  Past medical history, Surgical history, Family history not pertinant except as noted below, Social history, Allergies, and medications have been entered into the medical record, reviewed, and no changes needed.   Review of Systems: No fevers, chills, night sweats, weight loss, chest pain, or shortness of breath.   Objective:    General: Well Developed, well nourished, and in no acute distress.  Neuro: Alert and oriented x3, extra-ocular muscles intact, sensation grossly intact.  HEENT: Normocephalic, atraumatic, pupils equal round reactive to light, neck supple, no masses, no lymphadenopathy, thyroid nonpalpable.  Skin: Warm and dry, no rashes. Cardiac: Regular rate and rhythm, no murmurs rubs or gallops, no lower extremity edema.  Respiratory: Clear to auscultation bilaterally. Not using accessory muscles, speaking in full sentences.  MRI shows multilevel cervical spondylosis with disc protrusions worst at the C5-C6 and C6-C7 levels.  Impression and Recommendations:

## 2014-05-12 ENCOUNTER — Ambulatory Visit
Admission: RE | Admit: 2014-05-12 | Discharge: 2014-05-12 | Disposition: A | Discharge status: Home / Self Care | Payer: BC Managed Care – PPO | Source: Ambulatory Visit | Attending: Sports Medicine | Admitting: Sports Medicine

## 2014-05-12 DIAGNOSIS — M5412 Radiculopathy, cervical region: Secondary | ICD-10-CM

## 2014-05-12 MED ORDER — IOHEXOL 300 MG/ML  SOLN
1.0000 mL | Freq: Once | INTRAMUSCULAR | Status: AC | PRN
  Administered 2014-05-12: 1 mL via EPIDURAL

## 2014-05-12 MED ORDER — TRIAMCINOLONE ACETONIDE 40 MG/ML IJ SUSP (RADIOLOGY)
60.0000 mg | Freq: Once | INTRAMUSCULAR | Status: AC
  Administered 2014-05-12: 60 mg via EPIDURAL

## 2014-05-12 NOTE — Discharge Instructions (Signed)

## 2014-05-15 ENCOUNTER — Ambulatory Visit: Payer: BC Managed Care – PPO | Admitting: Family Medicine

## 2014-05-22 ENCOUNTER — Ambulatory Visit: Payer: BC Managed Care – PPO | Admitting: Family Medicine

## 2014-06-03 ENCOUNTER — Ambulatory Visit: Payer: BC Managed Care – PPO | Admitting: Family Medicine

## 2014-06-06 IMAGING — CR DG SHOULDER 2+V*L*
3 series · 3 of 3 positions shown · non-contrast
Comparison: None.

CLINICAL DATA: Left shoulder pain.

LEFT SHOULDER - 2+ VIEW

[view not recorded (1 of 3)]
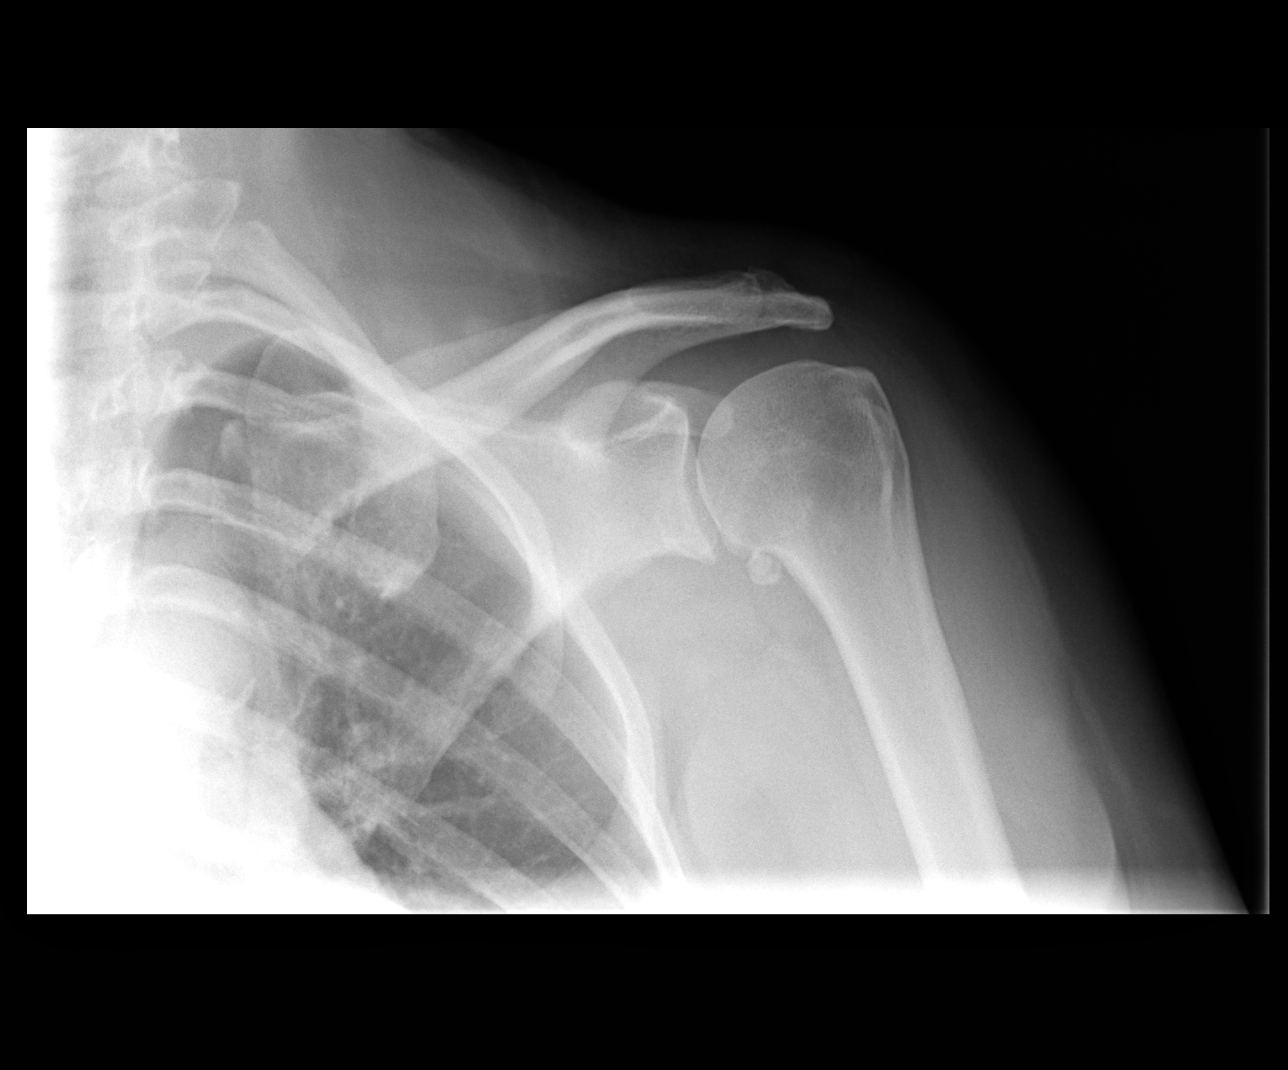

[view not recorded (2 of 3)]
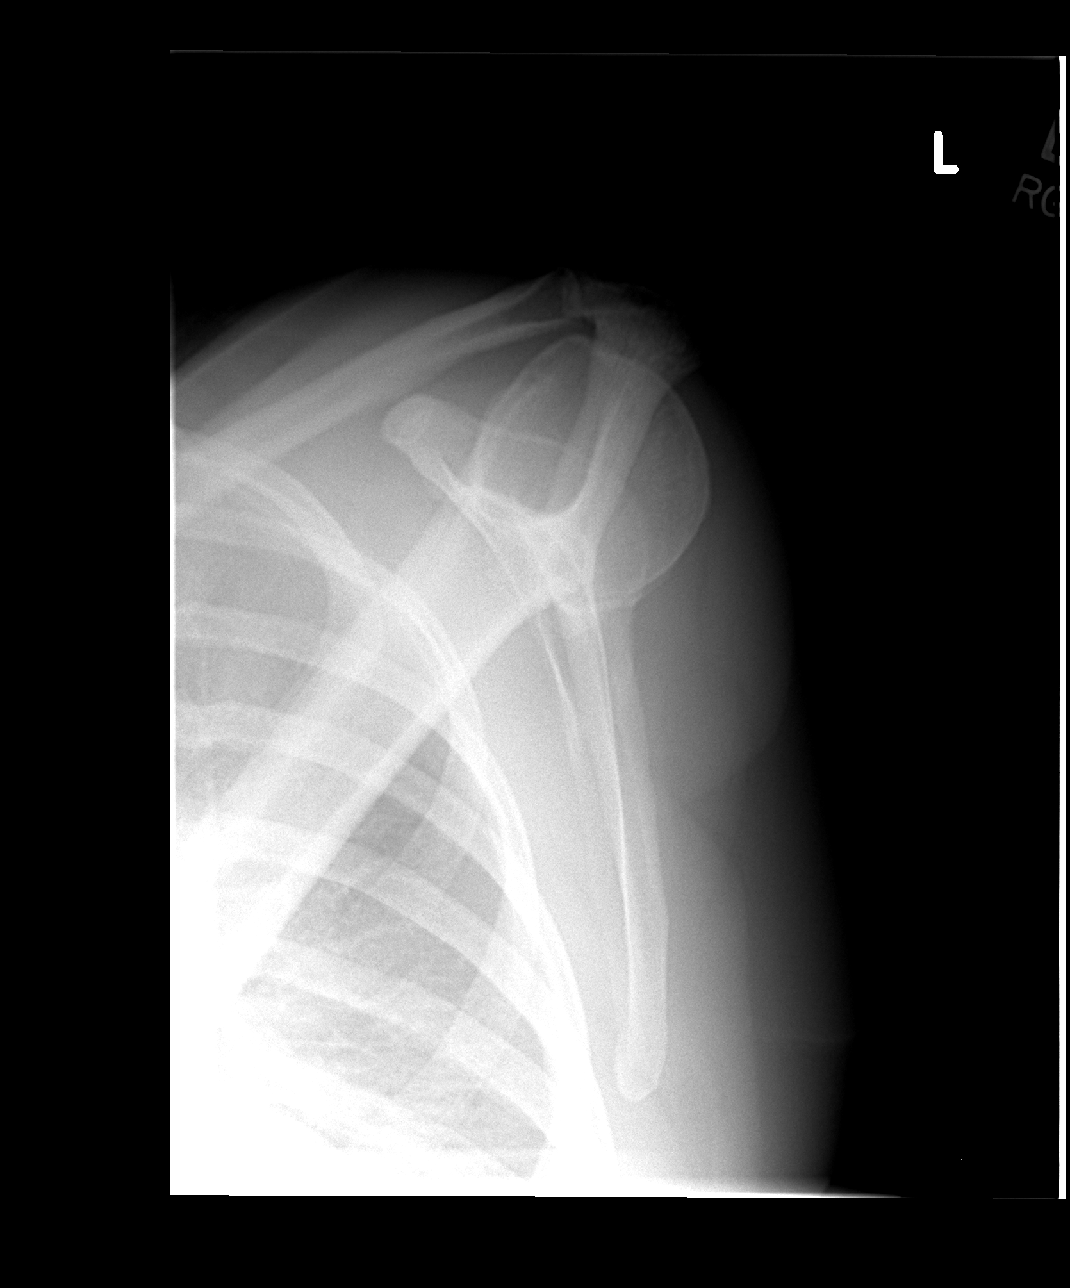

[view not recorded (3 of 3)]
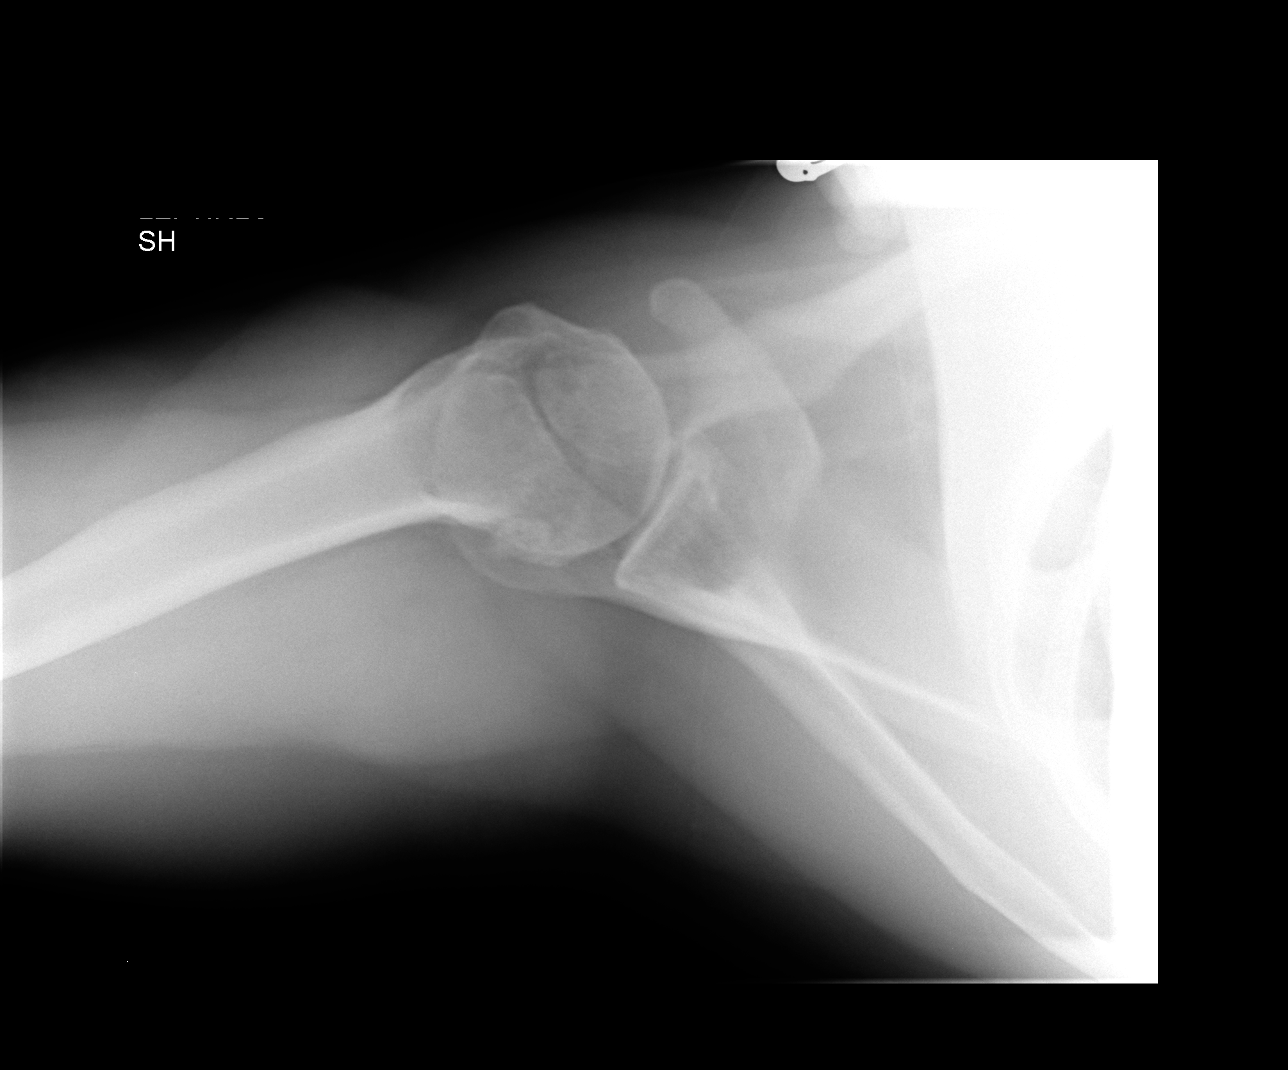

[3 of 3 positions shown; findings below may reference images not displayed]

FINDINGS: 8 mm ossific structure along the posterior inferior
margin of the left femoral head may representing free of chondral
fragment or a small focus of adjacent heterotopic ossification.

Mild glenohumeral spurring noted.  There is minimal AC joint
spurring.

Atherosclerotic calcification noted in the aortic arch.

The acromial undersurface is type 2 (curved).
IMPRESSION: 1.  8 mm ossific structure along the post anterior margin of the
humeral head could represent a free osteochondral fragment loose
within the joint, or an adjacent focus of heterotopic ossification.
2.  Degenerative glenohumeral spurring.
3.  Mild degenerative AC joint arthropathy.

## 2014-06-13 ENCOUNTER — Ambulatory Visit: Payer: BC Managed Care – PPO | Admitting: Sports Medicine

## 2014-06-13 ENCOUNTER — Ambulatory Visit: Payer: BC Managed Care – PPO | Admitting: Family Medicine

## 2014-06-24 ENCOUNTER — Ambulatory Visit: Admitting: Family Medicine

## 2014-06-24 ENCOUNTER — Ambulatory Visit: Admitting: Sports Medicine

## 2014-07-07 ENCOUNTER — Encounter: Payer: Self-pay | Admitting: Sports Medicine

## 2014-07-07 ENCOUNTER — Ambulatory Visit (INDEPENDENT_AMBULATORY_CARE_PROVIDER_SITE_OTHER): Payer: BLUE CROSS/BLUE SHIELD | Admitting: Sports Medicine

## 2014-07-07 ENCOUNTER — Encounter: Payer: Self-pay | Admitting: Family Medicine

## 2014-07-07 ENCOUNTER — Ambulatory Visit (INDEPENDENT_AMBULATORY_CARE_PROVIDER_SITE_OTHER): Payer: BLUE CROSS/BLUE SHIELD | Admitting: Family Medicine

## 2014-07-07 VITALS — BP 130/85 | HR 101 | Wt 199.0 lb

## 2014-07-07 DIAGNOSIS — K7689 Other specified diseases of liver: Secondary | ICD-10-CM | POA: Diagnosis not present

## 2014-07-07 DIAGNOSIS — K5900 Constipation, unspecified: Secondary | ICD-10-CM | POA: Diagnosis not present

## 2014-07-07 DIAGNOSIS — M19012 Primary osteoarthritis, left shoulder: Secondary | ICD-10-CM | POA: Diagnosis not present

## 2014-07-07 DIAGNOSIS — M19011 Primary osteoarthritis, right shoulder: Secondary | ICD-10-CM

## 2014-07-07 DIAGNOSIS — K760 Fatty (change of) liver, not elsewhere classified: Secondary | ICD-10-CM | POA: Diagnosis not present

## 2014-07-07 DIAGNOSIS — M5412 Radiculopathy, cervical region: Secondary | ICD-10-CM | POA: Diagnosis not present

## 2014-07-07 DIAGNOSIS — R05 Cough: Secondary | ICD-10-CM

## 2014-07-07 DIAGNOSIS — K5909 Other constipation: Secondary | ICD-10-CM

## 2014-07-07 DIAGNOSIS — R945 Abnormal results of liver function studies: Secondary | ICD-10-CM

## 2014-07-07 DIAGNOSIS — R053 Chronic cough: Secondary | ICD-10-CM

## 2014-07-07 DIAGNOSIS — R748 Abnormal levels of other serum enzymes: Secondary | ICD-10-CM

## 2014-07-07 MED ORDER — ATORVASTATIN CALCIUM 40 MG PO TABS: 40.0000 mg | ORAL_TABLET | Freq: Every day | ORAL | Status: DC

## 2014-07-07 MED ORDER — PANTOPRAZOLE SODIUM 40 MG PO TBEC: 40.0000 mg | DELAYED_RELEASE_TABLET | Freq: Every day | ORAL | Status: DC

## 2014-07-07 MED ORDER — LOSARTAN POTASSIUM 100 MG PO TABS: 100.0000 mg | ORAL_TABLET | Freq: Every day | ORAL | Status: DC

## 2014-07-07 MED ORDER — HYDROCODONE-ACETAMINOPHEN 7.5-325 MG PO TABS: 1.0000 | ORAL_TABLET | Freq: Three times a day (TID) | ORAL | Status: DC | PRN

## 2014-07-07 NOTE — Patient Instructions (Signed)
Recommend trial of miralax daily.

## 2014-07-07 NOTE — Progress Notes (Signed)
  Subjective:    CC: Follow-up   HPI: Left C7 radiculopathy: Completely resolved after epidural.  Left shoulder pain: Brianna Riley does continue to have some pain in her shoulder, at first she is not differentiated however on further questioning she tells me it is different from the neck pain and radicular pain which has since resolved, her current pain is only at the glenohumeral joint anteriorly. It is worse with external rotation and abduction. Moderate, persistent.  Past medical history, Surgical history, Family history not pertinant except as noted below, Social history, Allergies, and medications have been entered into the medical record, reviewed, and no changes needed.   Review of Systems: No fevers, chills, night sweats, weight loss, chest pain, or shortness of breath.   Objective:    General: Well Developed, well nourished, and in no acute distress.  Neuro: Alert and oriented x3, extra-ocular muscles intact, sensation grossly intact.  HEENT: Normocephalic, atraumatic, pupils equal round reactive to light, neck supple, no masses, no lymphadenopathy, thyroid nonpalpable.  Skin: Warm and dry, no rashes. Cardiac: Regular rate and rhythm, no murmurs rubs or gallops, no lower extremity edema.  Respiratory: Clear to auscultation bilaterally. Not using accessory muscles, speaking in full sentences. Left Shoulder: Inspection reveals no abnormalities, atrophy or asymmetry. Palpation is normal with no tenderness over AC joint or bicipital groove. ROM is full in all planes. Rotator cuff strength normal throughout. No signs of impingement with negative Neer and Hawkin's tests, empty can. Speeds and Yergason's tests normal. Positive crank test suggestive of glenohumeral pain Normal scapular function observed. No painful arc and no drop arm sign. No apprehension sign  Procedure: Real-time Ultrasound Guided Injection of left glenohumeral joint Device: GE Logiq E  Verbal informed consent  obtained.  Time-out conducted.  Noted no overlying erythema, induration, or other signs of local infection.  Skin prepped in a sterile fashion.  Local anesthesia: Topical Ethyl chloride.  With sterile technique and under real time ultrasound guidance:  Spinal needle advanced into joint, 1 mL kenalog 40, 4 mL lidocaine injected easily. Completed without difficulty  Pain immediately resolved suggesting accurate placement of the medication.  Advised to call if fevers/chills, erythema, induration, drainage, or persistent bleeding.  Images permanently stored and available for review in the ultrasound unit.  Impression: Technically successful ultrasound guided injection.  Impression and Recommendations:

## 2014-07-07 NOTE — Assessment & Plan Note (Addendum)
Patient does continue to endorse some anterior jointline shoulder pain, which is worse with external rotation suggestive of a recurrence of glenohumeral arthritis pain. Her last injection was last year. Repeat glenohumeral injection on the left side today. Return in 6 weeks.  Single prescription of hydrocodone given, patient understands that this will not be a long-term prescription, and there will be no further refills, if she is needing narcotics between injections it is time to consider surgical intervention.

## 2014-07-07 NOTE — Assessment & Plan Note (Signed)
Left-sided C7 radiculitis is 100% resolved after cervical epidural.

## 2014-07-07 NOTE — Progress Notes (Signed)
Subjective:    Patient ID: Brianna Riley, female    DOB: 08/14/1955, 59 y.o.   MRN: [id number]  HPI  F/u on abnormal liver enzymes.  Brianna Riley says Brianna Riley cut out all alcohol since we last call her with her blood work. Brianna Riley really doesn't take any Tylenol products. Brianna Riley denies any abdominal pain or discomfort.Marland Kitchen    Has been constipated for several months.Brianna Riley uses laxatives prn. Brianna Riley takes a stimulant laxative it does seem to help her bowels move. That sometimes Brianna Riley still has to manually removed stool. Doesn't take anything on a regular basis.  Brianna Riley uses hydrocodone. Has tried increased fiber and says Brianna Riley drinks pleny of water. No other alleviating or worsening factors.  Chronic cough - CXR was normal a year ago.  Former smoker.  Hx of GERD. Was on ACE and now on ARB.  Says occ weakes up and feels like can breath at night. But it is momentary.  Also notics some shortness of breath when goes up the stairs. Brianna Riley has about 7 steps in her house and says by the time Brianna Riley gets the top Brianna Riley has to stop to rest. Evening cleaning the kitchen Brianna Riley has to stop and rest.  Review of Systems  BP 130/85 mmHg  Pulse 101  Wt 199 lb (90.266 kg)  SpO2 91%    Allergies  Allergen Reactions  . Duloxetine Other (See Comments)    Irritable   . Hctz [Hydrochlorothiazide] Other (See Comments)    Caused inc in BUN/CR    Past Medical History  Diagnosis Date  . Hypertension, essential, benign   . Hyperlipidemia   . Allergic rhinitis, cause unspecified   . Lumbago   . Nonspecific abnormal results of liver function study   . Mild depression   . GERD (gastroesophageal reflux disease)   . Knee pain, bilateral   . Osteoarthritis   . Perimenopausal   . Insomnia   . Migraine   . Obesity     Past Surgical History  Procedure Laterality Date  . Cardiac catheterization  09/2006    [place], Dr. Jens Som  . Laparoscopy abdomen diagnostic    . Dobutamine stress echo  08/2008  . Cataract extraction w/ intraocular lens implant   2002    Right and Left    History   Social History  . Marital Status: Married    Spouse Name: N/A    Number of Children: N/A  . Years of Education: N/A   Occupational History  . UNEMPLOYED   . Retired    Social History Main Topics  . Smoking status: Former Smoker    Quit date: 06/06/1996  . Smokeless tobacco: Not on file  . Alcohol Use: Yes     Comment: Occasional EtOH  . Drug Use: Not on file  . Sexual Activity: No   Other Topics Concern  . 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 Lung cancer Mother   . Heart attack Father 40  . Drug abuse Brother     Outpatient Encounter Prescriptions as of 07/07/2014  Medication Sig  . aspirin 325 MG tablet Take 325 mg by mouth daily.  Marland Kitchen atorvastatin (LIPITOR) 40 MG tablet Take 1 tablet (  40 mg total) by mouth daily.  . cyclobenzaprine (FLEXERIL) 10 MG tablet One tab TID.  . fish oil-omega-3 fatty acids 1000 MG capsule Take 1 capsule by mouth daily.    . Flaxseed, Linseed, (FLAX SEED OIL) 1000 MG CAPS Take by mouth.  . Garlic Oil 500 MG CAPS Take by mouth daily.    Marland Kitchen. losartan (COZAAR) 100 MG tablet Take 1 tablet (100 mg total) by mouth daily.  . Multiple Vitamins-Iron (MULTIVITAMIN/IRON) TABS Take 1 tablet by mouth daily.    . niacin (SLO-NIACIN) 500 MG tablet Take 500 mg by mouth at bedtime.  . [DISCONTINUED] atorvastatin (LIPITOR) 40 MG tablet Take 1 tablet (40 mg total) by mouth daily.  . [DISCONTINUED] HYDROcodone-acetaminophen (NORCO) 7.5-325 MG per tablet Take 1 tablet by mouth every 8 (eight) hours as needed.  . [DISCONTINUED] losartan (COZAAR) 100 MG tablet Take 1 tablet (100 mg total) by mouth daily.  . pantoprazole (PROTONIX) 40 MG tablet Take 1 tablet (40 mg total) by mouth daily.  .  [DISCONTINUED] diazepam (VALIUM) 5 MG tablet Take 1 tab PO 1 hour before procedure or imaging.           Objective:   Physical Exam  Constitutional: Brianna Riley is oriented to person, place, and time. Brianna Riley appears well-developed and well-nourished.  HENT:  Head: Normocephalic and atraumatic.  Cardiovascular: Normal rate, regular rhythm and normal heart sounds.   Pulmonary/Chest: Effort normal and breath sounds normal.  Abdominal: Soft. Bowel sounds are normal. Brianna Riley exhibits no distension and no mass. There is no tenderness. There is no rebound and no guarding.  Neurological: Brianna Riley is alert and oriented to person, place, and time.  Skin: Skin is warm and dry.  Psychiatric: Brianna Riley has a normal mood and affect. Her behavior is normal.          Assessment & Plan:  Abnormal liver enzymes. Will reeck today. Consider US if still elevated.  US done 2 years ago showed some possible fatty liver disease which is most likely diagnosis but certainly we will see if the reduction in her out all intake has made a difference as well..    Chronic constipation - Discussed trial of Miralax since Brianna Riley has failed fiber supplements. I explained to her that it really is best to be proactive and take something on a more regular basis at this point time instead of waiting until Brianna Riley is very constipated and then try to take rescue medications. If this is not helpful we can always consider one of the prescription medications such as Amitiza or Linzess..   Chronic cough -  Had chest x-ray done about a year ago when Brianna Riley first mentioned this and was normal. We'll start with treatment with a PPI for one month. We'll go ahead and schedule her for spirometry in one month as well as Brianna Riley's also complaining of some significant short of breath

## 2014-07-08 ENCOUNTER — Ambulatory Visit: Admitting: Family Medicine

## 2014-07-08 LAB — COMPLETE METABOLIC PANEL WITH GFR
ALT: 49 U/L — AB (ref 0–35)
AST: 42 U/L — ABNORMAL HIGH (ref 0–37)
Albumin: 4.1 g/dL (ref 3.5–5.2)
Alkaline Phosphatase: 124 U/L — ABNORMAL HIGH (ref 39–117)
BUN: 20 mg/dL (ref 6–23)
CHLORIDE: 105 meq/L (ref 96–112)
CO2: 28 mEq/L (ref 19–32)
CREATININE: 1.03 mg/dL (ref 0.50–1.10)
Calcium: 9.9 mg/dL (ref 8.4–10.5)
GFR, Est African American: 70 mL/min
GFR, Est Non African American: 60 mL/min
Glucose, Bld: 90 mg/dL (ref 70–99)
Potassium: 5.3 mEq/L (ref 3.5–5.3)
Sodium: 146 mEq/L — ABNORMAL HIGH (ref 135–145)
Total Bilirubin: 0.3 mg/dL (ref 0.2–1.2)
Total Protein: 6.7 g/dL (ref 6.0–8.3)

## 2014-07-09 NOTE — Addendum Note (Signed)
Addended by: Nani GasserMETHENEY, CATHERINE D on: 07/09/2014 07:58 AM   Modules accepted: Orders

## 2014-07-11 ENCOUNTER — Other Ambulatory Visit: Payer: BLUE CROSS/BLUE SHIELD

## 2014-07-17 ENCOUNTER — Ambulatory Visit (INDEPENDENT_AMBULATORY_CARE_PROVIDER_SITE_OTHER): Payer: BLUE CROSS/BLUE SHIELD

## 2014-07-17 DIAGNOSIS — K760 Fatty (change of) liver, not elsewhere classified: Secondary | ICD-10-CM

## 2014-07-17 DIAGNOSIS — N281 Cyst of kidney, acquired: Secondary | ICD-10-CM | POA: Diagnosis not present

## 2014-07-17 DIAGNOSIS — R748 Abnormal levels of other serum enzymes: Secondary | ICD-10-CM

## 2014-07-31 ENCOUNTER — Emergency Department (INDEPENDENT_AMBULATORY_CARE_PROVIDER_SITE_OTHER): Payer: BLUE CROSS/BLUE SHIELD

## 2014-07-31 ENCOUNTER — Encounter: Payer: Self-pay | Admitting: Emergency Medicine

## 2014-07-31 ENCOUNTER — Emergency Department (INDEPENDENT_AMBULATORY_CARE_PROVIDER_SITE_OTHER)
Admission: EM | Admit: 2014-07-31 | Discharge: 2014-07-31 | Disposition: A | Discharge status: Home / Self Care | Payer: BLUE CROSS/BLUE SHIELD | Source: Home / Self Care | Attending: Emergency Medicine | Admitting: Emergency Medicine

## 2014-07-31 DIAGNOSIS — M25571 Pain in right ankle and joints of right foot: Secondary | ICD-10-CM | POA: Diagnosis not present

## 2014-07-31 DIAGNOSIS — S93401A Sprain of unspecified ligament of right ankle, initial encounter: Secondary | ICD-10-CM | POA: Diagnosis not present

## 2014-07-31 MED ORDER — HYDROCODONE-ACETAMINOPHEN 5-325 MG PO TABS: 2.0000 | ORAL_TABLET | ORAL | Status: DC | PRN

## 2014-07-31 NOTE — ED Provider Notes (Signed)
CSN: [id number]     Arrival date & time 07/31/14  1542 History   First MD Initiated Contact with Patient 07/31/14 1606     Chief Complaint  Patient presents with  . Fall    Patient fell 2 days ago hurting rt foot and ankle   (Consider location/radiation/quality/duration/timing/severity/associated sxs/prior Treatment) Patient is a 58 y.o. female presenting with ankle pain. The history is provided by the patient. No language interpreter was used.  Ankle Pain Location:  Ankle and foot Time since incident:  2 days Ankle location:  R ankle Foot location:  R foot Pain details:    Quality:  Aching   Radiates to:  Does not radiate   Severity:  Moderate   Onset quality:  Gradual   Duration:  2 days   Timing:  Constant   Progression:  Worsening Chronicity:  New Foreign body present:  No foreign bodies Prior injury to area:  No Relieved by:  Nothing Ineffective treatments:  None tried Associated symptoms: swelling   Risk factors: no concern for non-accidental trauma     Past Medical History  Diagnosis Date  . Hypertension, essential, benign   . Hyperlipidemia   . Allergic rhinitis, cause unspecified   . Lumbago   . Nonspecific abnormal results of liver function study   . Mild depression   . GERD (gastroesophageal reflux disease)   . Knee pain, bilateral   . Osteoarthritis   . Perimenopausal   . Insomnia   . Migraine   . Obesity    Past Surgical History  Procedure Laterality Date  . Cardiac catheterization  09/2006    [place], Dr. Jens Som  . Laparoscopy abdomen diagnostic    . Dobutamine stress echo  08/2008  . Cataract extraction w/ intraocular lens implant  2002    Right and Left   Family History  Problem Relation Age of Onset  . Lung cancer Mother   . Heart attack Father 45  . Drug abuse Brother    History  Substance Use Topics  . Smoking status: Former Smoker    Quit date: 06/06/1996  . Smokeless tobacco: Not on file  . Alcohol Use: Yes     Comment:  Occasional EtOH   OB History    No data available     Review of Systems  Musculoskeletal: Positive for arthralgias.  All other systems reviewed and are negative.   Allergies  Duloxetine and Hctz  Home Medications   Prior to Admission medications   Medication Sig Start Date End Date Taking? Authorizing Provider  aspirin 325 MG tablet Take 325 mg by mouth daily.    Historical Provider, MD  atorvastatin (LIPITOR) 40 MG tablet Take 1 tablet (40 mg total) by mouth daily. 07/07/14   Agapito Games, MD  cyclobenzaprine (FLEXERIL) 10 MG tablet One tab TID. 04/04/14   Monica Becton, MD  fish oil-omega-3 fatty acids 1000 MG capsule Take 1 capsule by mouth daily.      Historical Provider, MD  Flaxseed, Linseed, (FLAX SEED OIL) 1000 MG CAPS Take by mouth.    Historical Provider, MD  Garlic Oil 500 MG CAPS Take by mouth daily.      Historical Provider, MD  HYDROcodone-acetaminophen (NORCO) 7.5-325 MG per tablet Take 1 tablet by mouth every 8 (eight) hours as needed. 07/07/14   Monica Becton, MD  losartan (COZAAR) 100 MG tablet Take 1 tablet (100 mg total) by mouth daily. 07/07/14   Agapito Games, MD  Multiple Vitamins-Iron (MULTIVITAMIN/IRON)  TABS Take 1 tablet by mouth daily.      Historical Provider, MD  niacin (SLO-NIACIN) 500 MG tablet Take 500 mg by mouth at bedtime.    Historical Provider, MD  pantoprazole (PROTONIX) 40 MG tablet Take 1 tablet (40 mg total) by mouth daily. 07/07/14   Agapito Gamesatherine D Metheney, MD   BP 102/70 mmHg  Pulse 98  Temp(Src) 98.2 F (36.8 C) (Oral)  Ht 5\' 5"  (1.651 m)  Wt 198 lb (89.812 kg)  BMI 32.95 kg/m2  SpO2 94% Physical Exam  Constitutional: She is oriented to person, place, and time. She appears well-developed and well-nourished.  HENT:  Head: Normocephalic.  Eyes: EOM are normal.  Neck: Normal range of motion.  Pulmonary/Chest: Effort normal.  Abdominal: She exhibits no distension.  Musculoskeletal: She exhibits tenderness.   Tender right ankle , tender toes and foot nv and ns intact  Neurological: She is alert and oriented to person, place, and time.  Psychiatric: She has a normal mood and affect.  Nursing note and vitals reviewed.   ED Course  Procedures (including critical care time) Labs Review Labs Reviewed - No data to display  Imaging Review Dg Ankle Complete Right  07/31/2014   CLINICAL DATA:  Fall 2 days ago with persistent ankle pain, initial encounter  EXAM: RIGHT ANKLE - COMPLETE 3+ VIEW  COMPARISON:  None.  FINDINGS: There is no evidence of fracture, dislocation, or joint effusion. There is no evidence of arthropathy or other focal bone abnormality. Soft tissues are unremarkable.  IMPRESSION: No acute abnormality noted.   Electronically Signed   By: Alcide CleverMark  Lukens M.D.   On: 07/31/2014 16:49   Dg Foot Complete Right  07/31/2014   CLINICAL DATA:  RIGHT foot and ankle pain and tenderness after falling 2 days ago  EXAM: RIGHT FOOT COMPLETE - 3+ VIEW  COMPARISON:  None  FINDINGS: Osseous mineralization normal for technique.  Joint spaces preserved.  No acute fracture, dislocation or bone destruction.  IMPRESSION: Normal exam.   Electronically Signed   By: Ulyses SouthwardMark  Boles M.D.   On: 07/31/2014 16:48     MDM no fracture, Pt walks with cane, crutches not optional,   Pt given rx for hydrocodone.  Pt advised to see Dr. Linford Arnoldmetheney for recheck   1. Ankle sprain, right, initial encounter   AVS ASo     Elson AreasLeslie K Jehan Bonano, PA-C 07/31/14 1937

## 2014-07-31 NOTE — Discharge Instructions (Signed)

## 2014-08-04 ENCOUNTER — Encounter: Payer: Self-pay | Admitting: Family Medicine

## 2014-08-04 ENCOUNTER — Ambulatory Visit (INDEPENDENT_AMBULATORY_CARE_PROVIDER_SITE_OTHER): Payer: BLUE CROSS/BLUE SHIELD | Admitting: Family Medicine

## 2014-08-04 VITALS — BP 142/87 | HR 100 | Wt 199.0 lb

## 2014-08-04 DIAGNOSIS — R7301 Impaired fasting glucose: Secondary | ICD-10-CM | POA: Diagnosis not present

## 2014-08-04 DIAGNOSIS — R32 Unspecified urinary incontinence: Secondary | ICD-10-CM | POA: Diagnosis not present

## 2014-08-04 DIAGNOSIS — R053 Chronic cough: Secondary | ICD-10-CM

## 2014-08-04 DIAGNOSIS — J984 Other disorders of lung: Secondary | ICD-10-CM | POA: Diagnosis not present

## 2014-08-04 DIAGNOSIS — R05 Cough: Secondary | ICD-10-CM

## 2014-08-04 DIAGNOSIS — S93401A Sprain of unspecified ligament of right ankle, initial encounter: Secondary | ICD-10-CM

## 2014-08-04 LAB — POCT URINALYSIS DIPSTICK
BILIRUBIN UA: NEGATIVE
Blood, UA: NEGATIVE
Glucose, UA: NEGATIVE
Ketones, UA: NEGATIVE
LEUKOCYTES UA: NEGATIVE
Nitrite, UA: NEGATIVE
PH UA: 6.5
Protein, UA: NEGATIVE
Spec Grav, UA: 1.02
Urobilinogen, UA: 0.2

## 2014-08-04 LAB — POCT GLYCOSYLATED HEMOGLOBIN (HGB A1C): Hemoglobin A1C: 5.9

## 2014-08-04 MED ORDER — ALBUTEROL SULFATE (2.5 MG/3ML) 0.083% IN NEBU
2.5000 mg | INHALATION_SOLUTION | Freq: Once | RESPIRATORY_TRACT | Status: AC
  Administered 2014-08-04: 2.5 mg via RESPIRATORY_TRACT

## 2014-08-04 NOTE — Progress Notes (Signed)
   Subjective:    Patient ID: Brianna Riley, female    DOB: 05-Oct-1955, 59 y.o.   MRN: [id number]  HPI F/U chronic cough - Here for spirometry. No improvement with one month of PPI therapy.   Chest x-ray year ago showed minimal basilar volume loss. She denies actually feeling short of breath.  Larey SeatFell a week ago and sprained her right ankle. She was given an ASO but says was too uncomfortable to wears it.  Says has been using a cane ot help her walk.    Has urinary frequency.  Having some urgency.  Says can leak with laughing, crying, etc.  Started about a year ago.  Feels veyr thirsty and feels like her mouth is really dry.  Sometimes she has incontinent episodes where she doesn't even know it happened.   Review of Systems     Objective:   Physical Exam  Constitutional: She is oriented to person, place, and time. She appears well-developed and well-nourished.  HENT:  Head: Normocephalic and atraumatic.  Cardiovascular: Normal rate, regular rhythm and normal heart sounds.   Pulmonary/Chest: Effort normal and breath sounds normal.  Neurological: She is alert and oriented to person, place, and time.  Skin: Skin is warm and dry.  Psychiatric: She has a normal mood and affect. Her behavior is normal.    She does have some tenderness and swelling below the lateral malleolus.        Assessment & Plan:  Chronic cough - spirometry today showed a restrictive pattern. Though effort was fair. She coughed a lot during the test. Recommend full PFTs for further evaluation. If it does confirm restrictive disease and will refer to pulmonology for further treatment and evaluation.  Right ankle sprain - will will wrap with Ace wrap today. She does have a fair amount of swelling on the lateral portion of the foot.  Urinary incontinence - will check urinalysis. She has tried an overactive bladder medication the past that was not helpful. She doesn't the name of it.  Inc thirst - IFG - A1C is elevated  at 5.9.  New dx. Work on diet and exercise.

## 2014-08-14 IMAGING — US US ABDOMEN COMPLETE
1 series · 13 of 25 positions shown · non-contrast
Comparison: Ultrasound of the abdomen of 11/13/2007

CLINICAL DATA: Elevated liver function tests

COMPLETE ABDOMINAL ULTRASOUND

[Series 1: us abdomen complete · 0.30mm/px · 13 of 97 slices shown]
[im 1/97]
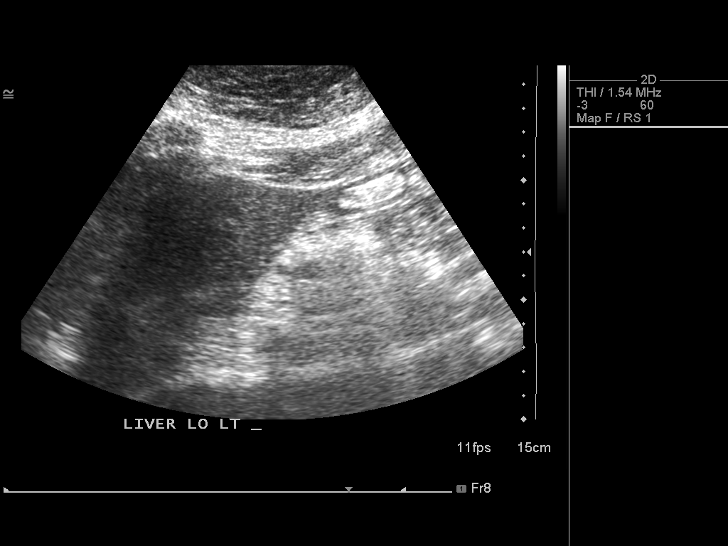
[im 9/97]
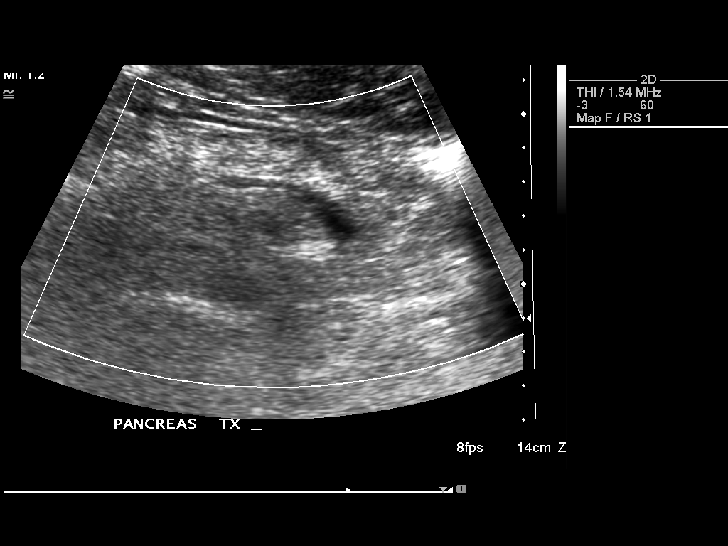
[im 17/97]
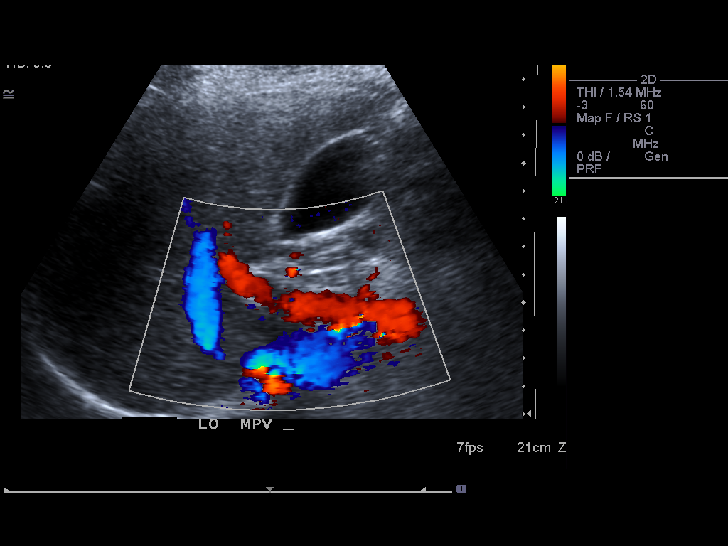
[im 25/97]
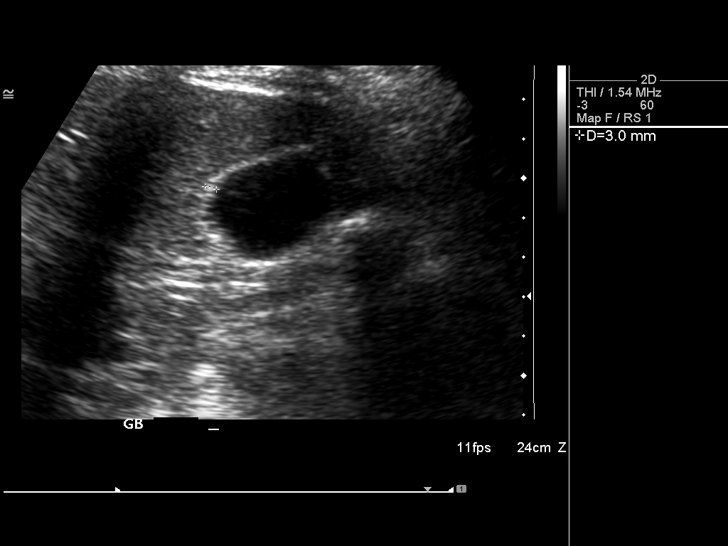
[im 33/97]
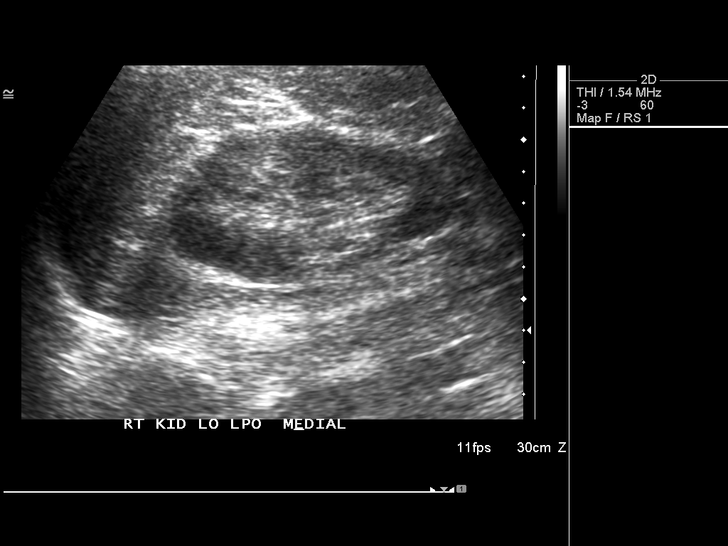
[im 41/97]
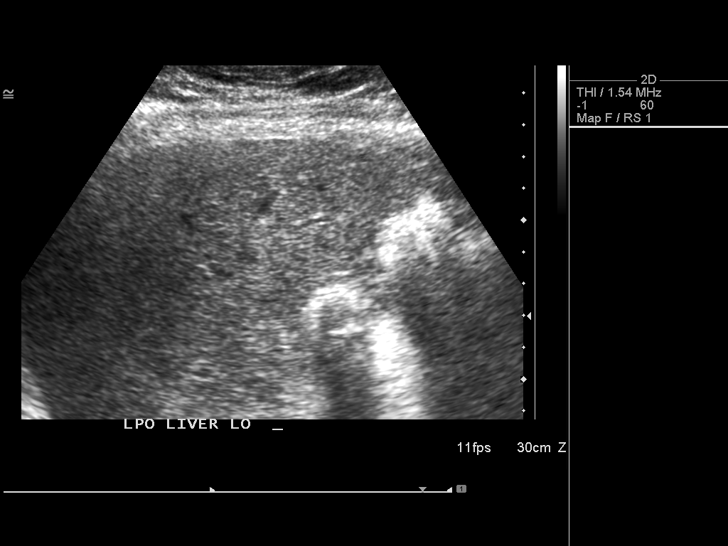
[im 49/97]
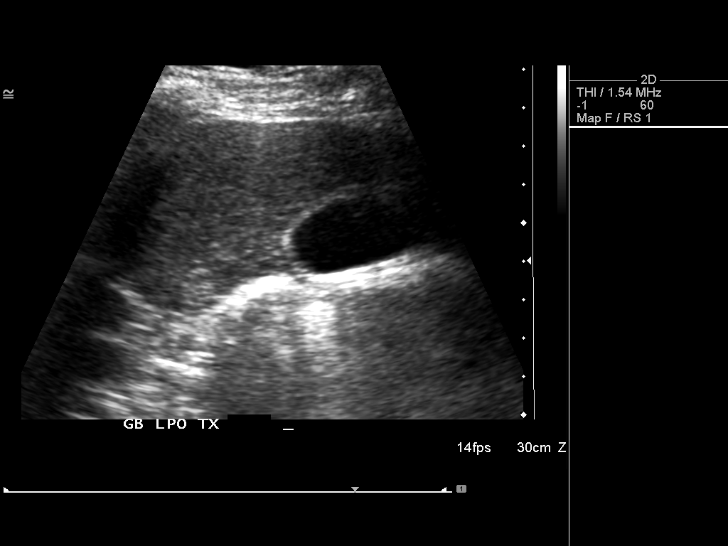
[im 57/97]
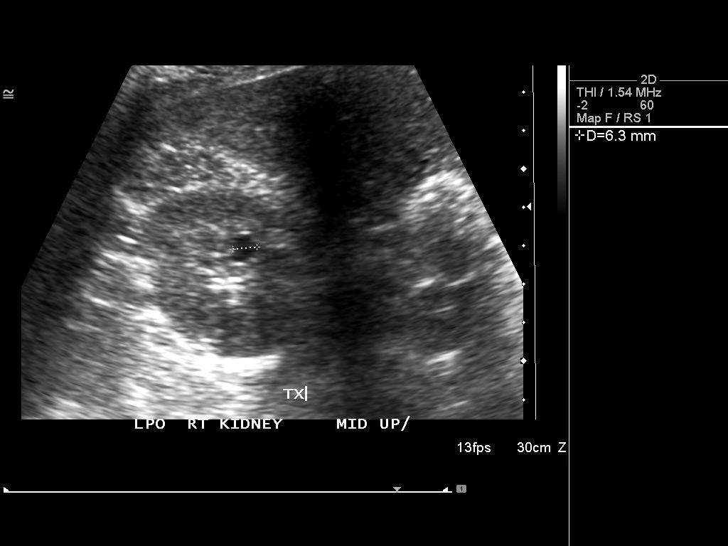
[im 65/97]
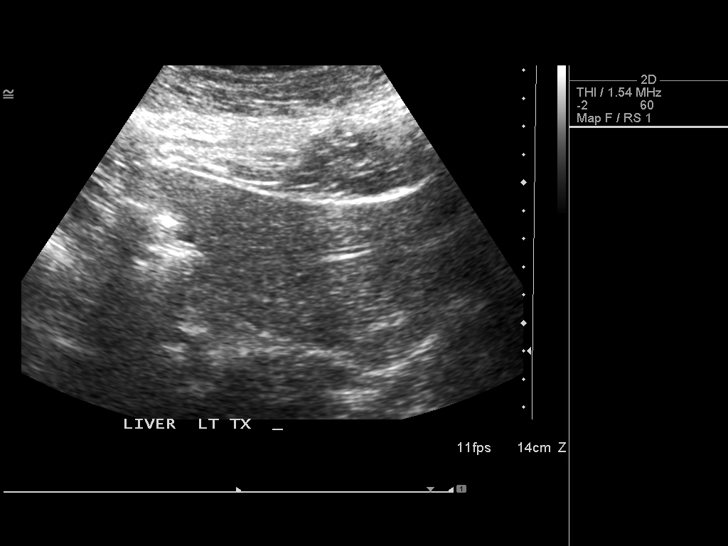
[im 73/97]
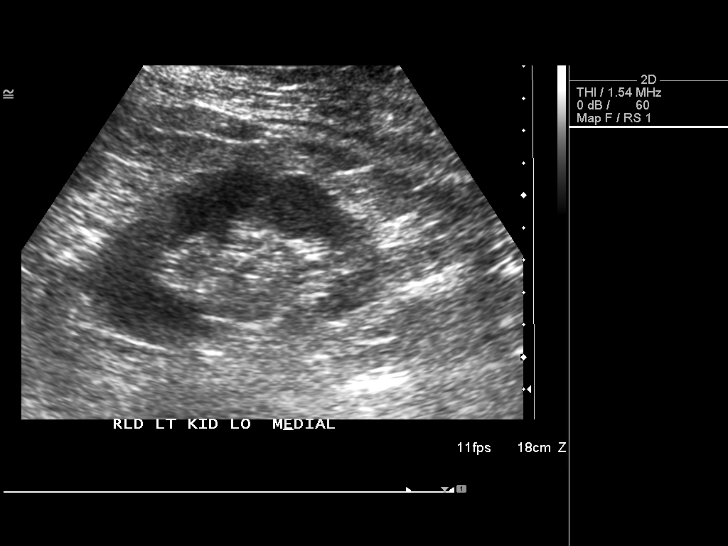
[im 81/97]
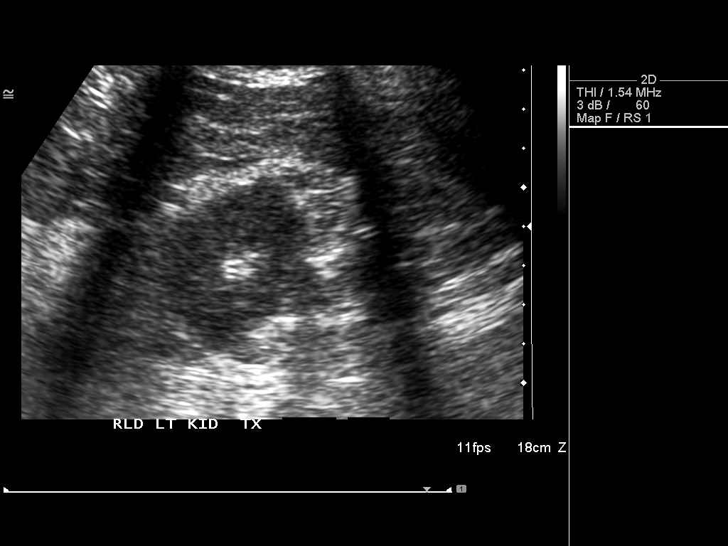
[im 89/97]
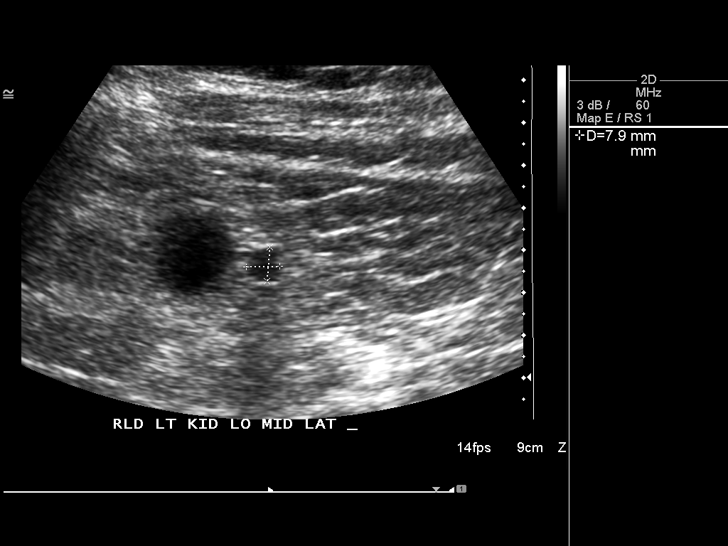
[im 97/97]
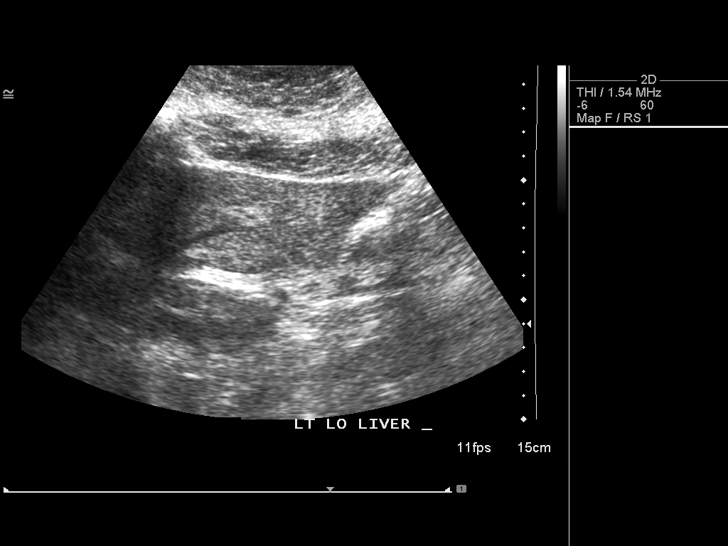

[13 of 25 positions shown; findings below may reference images not displayed]

FINDINGS: Gallbladder:  The gallbladder is visualized and no gallstones are
noted.  There is no pain over the gallbladder with compression.

Common bile duct:  The common bile duct is normal measuring 5.4 mm
in diameter.

Liver:  The liver is slightly echogenic suggesting fatty
infiltration.  No focal abnormality is seen.

IVC:  The IVC is obscured by bowel gas.

Pancreas:  The pancreas also is largely obscured by bowel gas and
patient body habitus.

Spleen:  The spleen is normal measuring 5.6 cm sagittally.

Right Kidney:  No hydronephrosis is seen.  The right kidney
measures 9.6 cm sagittally.  A small cyst is present in the upper
pole of only 7 mm in diameter.

Left Kidney:  No hydronephrosis is noted.  The left kidney measures
10.0 cm.  A cyst is noted in the mid lateral left kidney of 3.3 x
3.4 x 2.7 cm with thin septations as noted previously.

Abdominal aorta:  The abdominal aorta is normal in caliber although
obscured proximally by bowel gas.

Portions of this study are compromised by bowel gas and large
patient body habitus.
IMPRESSION: 1.  Somewhat echogenic liver suggests fatty infiltration.  No focal
abnormality is seen.
2.  No gallstones.
3.  Liver cysts, one of which on the left has thin septation.

## 2014-08-18 ENCOUNTER — Ambulatory Visit: Payer: BLUE CROSS/BLUE SHIELD | Admitting: Sports Medicine

## 2014-08-25 ENCOUNTER — Ambulatory Visit: Payer: BLUE CROSS/BLUE SHIELD | Admitting: Sports Medicine

## 2014-08-26 ENCOUNTER — Ambulatory Visit: Payer: BLUE CROSS/BLUE SHIELD | Admitting: Sports Medicine

## 2014-09-09 ENCOUNTER — Encounter (INDEPENDENT_AMBULATORY_CARE_PROVIDER_SITE_OTHER): Payer: BLUE CROSS/BLUE SHIELD

## 2014-09-09 DIAGNOSIS — R05 Cough: Secondary | ICD-10-CM

## 2014-09-09 DIAGNOSIS — R0602 Shortness of breath: Secondary | ICD-10-CM

## 2014-09-09 DIAGNOSIS — R053 Chronic cough: Secondary | ICD-10-CM

## 2014-09-09 DIAGNOSIS — J984 Other disorders of lung: Secondary | ICD-10-CM

## 2014-09-09 LAB — PULMONARY FUNCTION TEST
FEF 25-75 Pre: 2.09 L/sec
FEF2575-%Pred-Pre: 87 %
FEV1-%Pred-Pre: 73 %
FEV1-Pre: 1.86 L
FEV1FVC-%PRED-PRE: 105 %
FEV6-%PRED-PRE: 70 %
FEV6-PRE: 2.21 L
FEV6FVC-%Pred-Pre: 102 %
FVC-%Pred-Pre: 68 %
FVC-Pre: 2.22 L
Pre FEV1/FVC ratio: 84 %
Pre FEV6/FVC Ratio: 99 %

## 2015-02-02 ENCOUNTER — Encounter: Payer: BLUE CROSS/BLUE SHIELD | Admitting: Family Medicine

## 2015-02-02 ENCOUNTER — Ambulatory Visit: Payer: BLUE CROSS/BLUE SHIELD | Admitting: Sports Medicine

## 2015-02-27 ENCOUNTER — Ambulatory Visit: Payer: BLUE CROSS/BLUE SHIELD | Admitting: Sports Medicine

## 2015-02-27 ENCOUNTER — Encounter: Payer: BLUE CROSS/BLUE SHIELD | Admitting: Family Medicine

## 2015-03-06 ENCOUNTER — Ambulatory Visit: Payer: BLUE CROSS/BLUE SHIELD | Admitting: Sports Medicine

## 2015-03-06 ENCOUNTER — Encounter: Payer: BLUE CROSS/BLUE SHIELD | Admitting: Family Medicine

## 2015-03-10 ENCOUNTER — Encounter: Payer: Self-pay | Admitting: Family Medicine

## 2015-03-10 ENCOUNTER — Encounter: Payer: BLUE CROSS/BLUE SHIELD | Admitting: Family Medicine

## 2015-03-10 ENCOUNTER — Ambulatory Visit (INDEPENDENT_AMBULATORY_CARE_PROVIDER_SITE_OTHER): Payer: BLUE CROSS/BLUE SHIELD | Admitting: Sports Medicine

## 2015-03-10 ENCOUNTER — Ambulatory Visit (INDEPENDENT_AMBULATORY_CARE_PROVIDER_SITE_OTHER): Payer: BLUE CROSS/BLUE SHIELD | Admitting: Family Medicine

## 2015-03-10 ENCOUNTER — Other Ambulatory Visit (HOSPITAL_COMMUNITY)
Admission: RE | Admit: 2015-03-10 | Discharge: 2015-03-10 | Disposition: A | Discharge status: Home / Self Care | Payer: BLUE CROSS/BLUE SHIELD | Source: Ambulatory Visit | Attending: Family Medicine | Admitting: Family Medicine

## 2015-03-10 VITALS — BP 107/65 | HR 123 | Wt 189.0 lb

## 2015-03-10 DIAGNOSIS — Z01419 Encounter for gynecological examination (general) (routine) without abnormal findings: Secondary | ICD-10-CM | POA: Diagnosis present

## 2015-03-10 DIAGNOSIS — R748 Abnormal levels of other serum enzymes: Secondary | ICD-10-CM

## 2015-03-10 DIAGNOSIS — R238 Other skin changes: Secondary | ICD-10-CM | POA: Diagnosis not present

## 2015-03-10 DIAGNOSIS — N183 Chronic kidney disease, stage 3 (moderate): Secondary | ICD-10-CM | POA: Diagnosis not present

## 2015-03-10 DIAGNOSIS — N1832 Chronic kidney disease, stage 3b: Secondary | ICD-10-CM

## 2015-03-10 DIAGNOSIS — R079 Chest pain, unspecified: Secondary | ICD-10-CM

## 2015-03-10 DIAGNOSIS — Z0000 Encounter for general adult medical examination without abnormal findings: Secondary | ICD-10-CM | POA: Diagnosis not present

## 2015-03-10 DIAGNOSIS — M19012 Primary osteoarthritis, left shoulder: Secondary | ICD-10-CM | POA: Diagnosis not present

## 2015-03-10 DIAGNOSIS — R000 Tachycardia, unspecified: Secondary | ICD-10-CM | POA: Diagnosis not present

## 2015-03-10 DIAGNOSIS — Z1151 Encounter for screening for human papillomavirus (HPV): Secondary | ICD-10-CM | POA: Insufficient documentation

## 2015-03-10 DIAGNOSIS — R233 Spontaneous ecchymoses: Secondary | ICD-10-CM

## 2015-03-10 DIAGNOSIS — M19011 Primary osteoarthritis, right shoulder: Secondary | ICD-10-CM

## 2015-03-10 MED ORDER — TRAMADOL HCL 50 MG PO TABS: ORAL_TABLET | ORAL | Status: DC

## 2015-03-10 NOTE — Progress Notes (Signed)
  Subjective:    CC: left shoulder pain  HPI: Brianna Riley returns, she has bilateral glenohumeral osteoarthritis, she has had an eight-month response since the prior left glenohumeral injection and his rate for repeat, pain is moderate, persistent, localized to the joint line's and worse with external rotation of the shoulder.  Past medical history, Surgical history, Family history not pertinant except as noted below, Social history, Allergies, and medications have been entered into the medical record, reviewed, and no changes needed.   Review of Systems: No fevers, chills, night sweats, weight loss, chest pain, or shortness of breath.   Objective:    General: Well Developed, well nourished, and in no acute distress.  Neuro: Alert and oriented x3, extra-ocular muscles intact, sensation grossly intact.  HEENT: Normocephalic, atraumatic, pupils equal round reactive to light, neck supple, no masses, no lymphadenopathy, thyroid nonpalpable.  Skin: Warm and dry, no rashes. Cardiac: Regular rate and rhythm, no murmurs rubs or gallops, no lower extremity edema.  Respiratory: Clear to auscultation bilaterally. Not using accessory muscles, speaking in full sentences.  Procedure: Real-time Ultrasound Guided Injection of left glenohumeral joint Device: GE Logiq E  Verbal informed consent obtained.  Time-out conducted.  Noted no overlying erythema, induration, or other signs of local infection.  Skin prepped in a sterile fashion.  Local anesthesia: Topical Ethyl chloride.  With sterile technique and under real time ultrasound guidance:  Using a 22-gauge spinal needle and taking care to avoid the labrum advanced into the good humeral joint, at that 0.1 mL Kenalog 40, 4 mL lidocaine was injected easily. Completed without difficulty  Pain immediately resolved suggesting accurate placement of the medication.  Advised to call if fevers/chills, erythema, induration, drainage, or persistent bleeding.  Images  permanently stored and available for review in the ultrasound unit.  Impression: Technically successful ultrasound guided injection.  Impression and Recommendations:

## 2015-03-10 NOTE — Progress Notes (Signed)
Subjective:     Brianna Riley is a 59 y.o. female and is here for a comprehensive physical exam. The patient reports problems - chest pain.  it occurred about a week ago. Around the time that her husband was having a seizure says she was quite stressed. She felt like it was a very heavy pressure sitting on her chest. She took 2 of her husbands nitroglycerin and says that the pain eased off after about 10 minutes. She has not had any episodes since then. She did have chest pain about 20 years ago and was seen in the [place] for it at that time. It sounds like they really didn't do much of a workup or evaluation.   Social History   Social History  . Marital Status: Married    Spouse Name: N/A  . Number of Children: N/A  . Years of Education: N/A   Occupational History  . UNEMPLOYED   . Retired    Social History Main Topics  . Smoking status: Former Smoker    Quit date: 06/06/1996  . Smokeless tobacco: Not on file  . Alcohol Use: Yes     Comment: Occasional EtOH  . Drug Use: Not on file  . Sexual Activity: No   Other Topics Concern  . 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 HIV Screening  02/15/1972  . PAP SMEAR  01/27/2015  . INFLUENZA VACCINE  01/05/2016  . MAMMOGRAM  04/17/2016  . COLONOSCOPY  06/06/2017  . TETANUS/TDAP  01/26/2025  . Hepatitis C Screening  Completed    The following portions of the patient's history were reviewed and updated as appropriate: allergies, current medications, past family history, past medical history, past social history, past surgical history and problem list.  Review of Systems Pertinent items noted in HPI and remainder of comprehensive ROS otherwise negative.   Objective:    BP 107/65 mmHg  Pulse 123  Wt 189 lb (85.73  kg) General appearance: alert, cooperative and appears stated age Head: Normocephalic, without obvious abnormality, atraumatic Eyes: conj clear, EOMI, PEERLA Ears: normal TM's and external ear canals both ears Nose: Nares normal. Septum midline. Mucosa normal. No drainage or sinus tenderness. Throat: lips, mucosa, and tongue normal; teeth and gums normal Neck: no adenopathy, no carotid bruit, no JVD, supple, symmetrical, trachea midline and thyroid not enlarged, symmetric, no tenderness/mass/nodules Back: symmetric, no curvature. ROM normal. No CVA tenderness. Lungs: clear to auscultation bilaterally Breasts: normal appearance, no masses or tenderness Heart: regular rate and rhythm, S1, S2 normal, no murmur, click, rub or gallop Abdomen: soft, non-tender; bowel sounds normal; no masses,  no organomegaly Pelvic: cervix normal in appearance, external genitalia normal, no adnexal masses or tenderness, no cervical motion tenderness, rectovaginal septum normal, uterus normal size, shape, and consistency and vagina normal without discharge Extremities: extremities normal, atraumatic, no cyanosis or edema Pulses: 2+ and symmetric Skin: Skin color, texture, turgor normal. No rashes or lesions Lymph nodes: Cervical, supraclavicular, and axillary nodes normal. Neurologic: Alert and oriented X 3, normal strength and tone. Normal symmetric reflexes. Normal coordination and gait    Assessment:    Healthy female exam.      Plan:     See After Visit  Summary for Counseling Recommendations  Keep up a regular exercise program and make sure you are eating a healthy diet Try to eat 4 servings of dairy a day, or if you are lactose intolerant take a calcium with vitamin D daily.  Your vaccines are up to date.  Pap smear performed today.    Elevated liver enzymes-due to recheck.  Easy bruising-we'll check a CBC, liver function and INR.  Chest pain-most likely stress-related. She has not had any pain  since the initial episode. We'll do an EKG today. She has known right bundle branch block. EKG today shows sinus tachycardia with right bundle branch block no significant changes from previous in 2014. Will check TSH since she is tachycardic and has had a 10 lb unexplained weight loss.   CKD 3 - due to recheck kidney function.

## 2015-03-10 NOTE — Patient Instructions (Signed)
Cut losartan in half.   Keep up a regular exercise program and make sure you are eating a healthy diet Try to eat 4 servings of dairy a day, or if you are lactose intolerant take a calcium with vitamin D daily.  Your vaccines are up to date.

## 2015-03-10 NOTE — Assessment & Plan Note (Signed)
Eight-month response to previous glenohumeral injection. Repeat left-sided glenohumeral injection as above, adding tramadol for pain.  Return to see me on an as-needed basis.

## 2015-03-11 LAB — COMPLETE METABOLIC PANEL WITH GFR
ALT: 39 U/L — AB (ref 6–29)
AST: 39 U/L — ABNORMAL HIGH (ref 10–35)
Albumin: 4 g/dL (ref 3.6–5.1)
Alkaline Phosphatase: 129 U/L (ref 33–130)
BILIRUBIN TOTAL: 0.3 mg/dL (ref 0.2–1.2)
BUN: 26 mg/dL — ABNORMAL HIGH (ref 7–25)
CO2: 29 mmol/L (ref 20–31)
CREATININE: 1.1 mg/dL — AB (ref 0.50–1.05)
Calcium: 9.8 mg/dL (ref 8.6–10.4)
Chloride: 104 mmol/L (ref 98–110)
GFR, Est African American: 64 mL/min (ref >=60)
GFR, Est Non African American: 55 mL/min — ABNORMAL LOW (ref >=60)
Glucose, Bld: 81 mg/dL (ref 65–99)
Potassium: 4.5 mmol/L (ref 3.5–5.3)
SODIUM: 142 mmol/L (ref 135–146)
TOTAL PROTEIN: 6.6 g/dL (ref 6.1–8.1)

## 2015-03-11 LAB — TSH: TSH: 1.828 u[IU]/mL (ref 0.350–4.500)

## 2015-03-11 LAB — PROTIME-INR
INR: 0.94 (ref <1.50)
PROTHROMBIN TIME: 12.7 s (ref 11.6–15.2)

## 2015-03-11 LAB — CBC
HCT: 40.8 % (ref 36.0–46.0)
Hemoglobin: 13.1 g/dL (ref 12.0–15.0)
MCH: 29.6 pg (ref 26.0–34.0)
MCHC: 32.1 g/dL (ref 30.0–36.0)
MCV: 92.3 fL (ref 78.0–100.0)
MPV: 9.6 fL (ref 8.6–12.4)
PLATELETS: 304 10*3/uL (ref 150–400)
RBC: 4.42 MIL/uL (ref 3.87–5.11)
RDW: 15.9 % — AB (ref 11.5–15.5)
WBC: 8.4 10*3/uL (ref 4.0–10.5)

## 2015-03-13 LAB — CYTOLOGY - PAP

## 2015-03-26 IMAGING — CR DG SHOULDER 2+V*R*
3 series · 3 of 3 positions shown · non-contrast
Comparison: None

CLINICAL DATA: Shoulder pain.

RIGHT SHOULDER - 2+ VIEW

[view not recorded (1 of 3)]
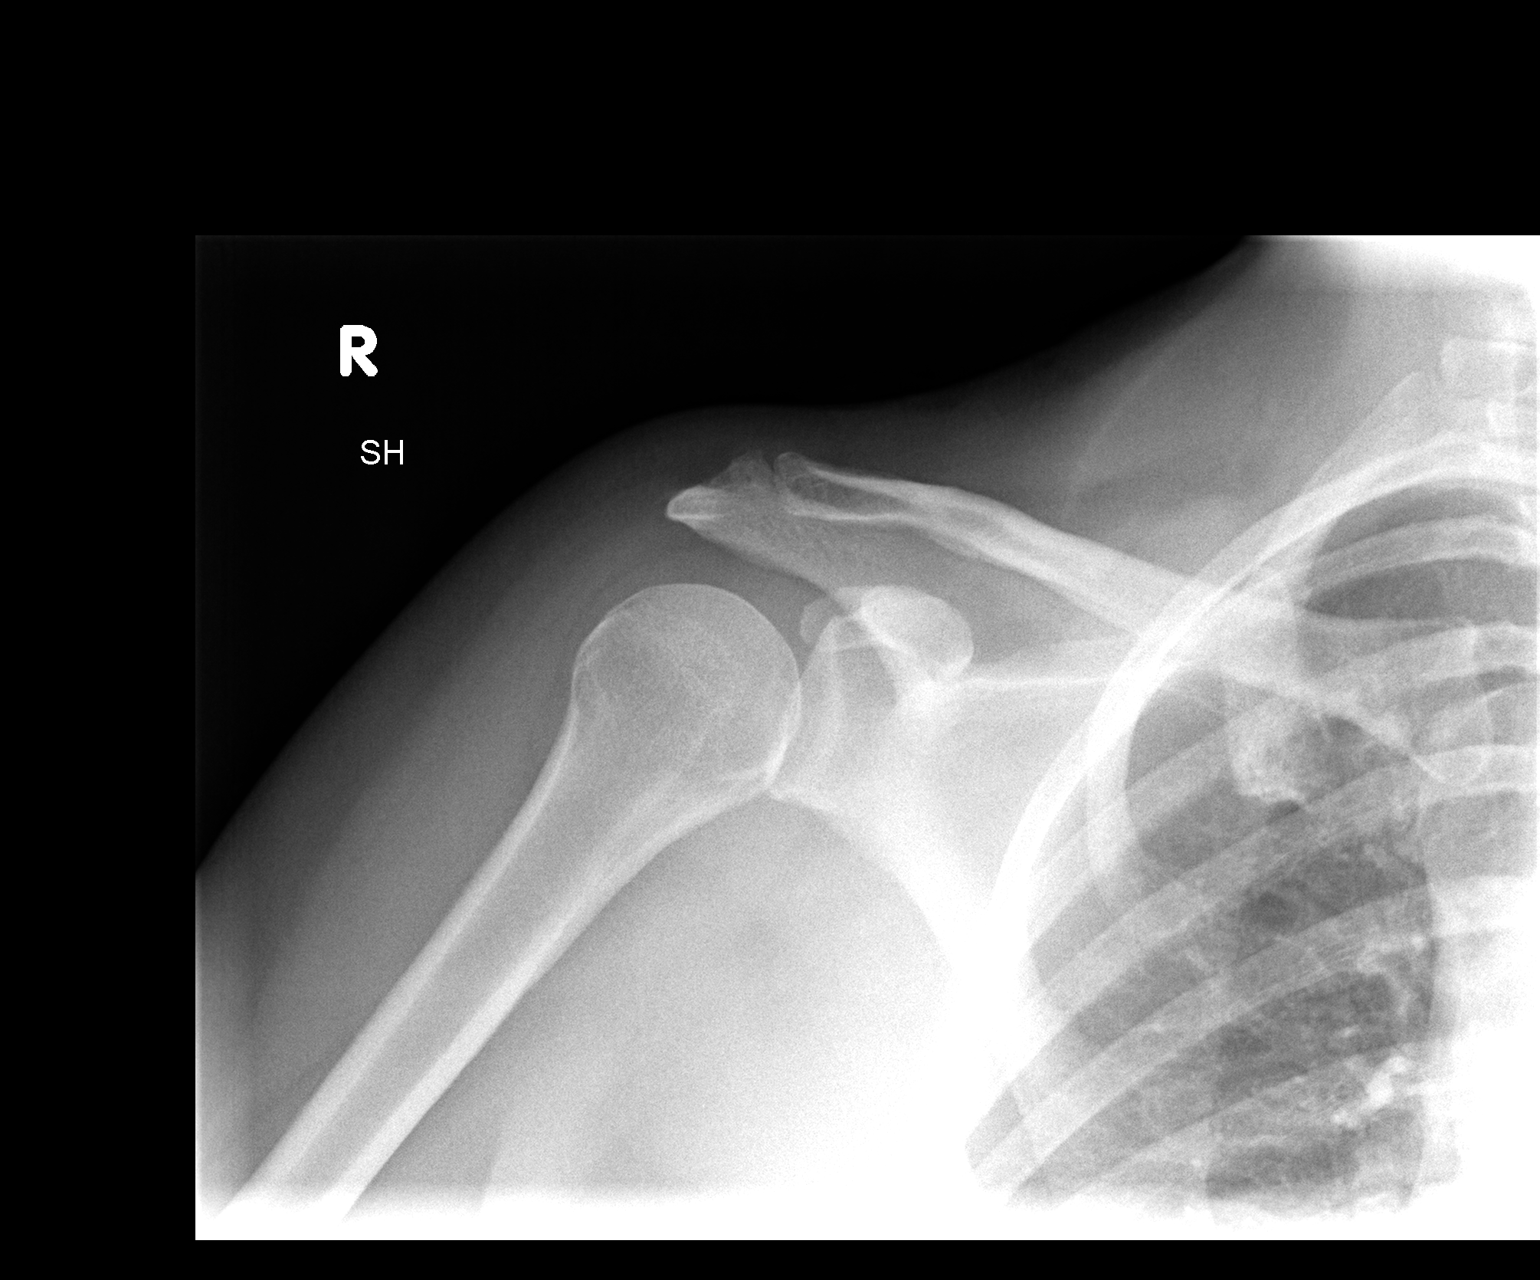

[view not recorded (2 of 3)]
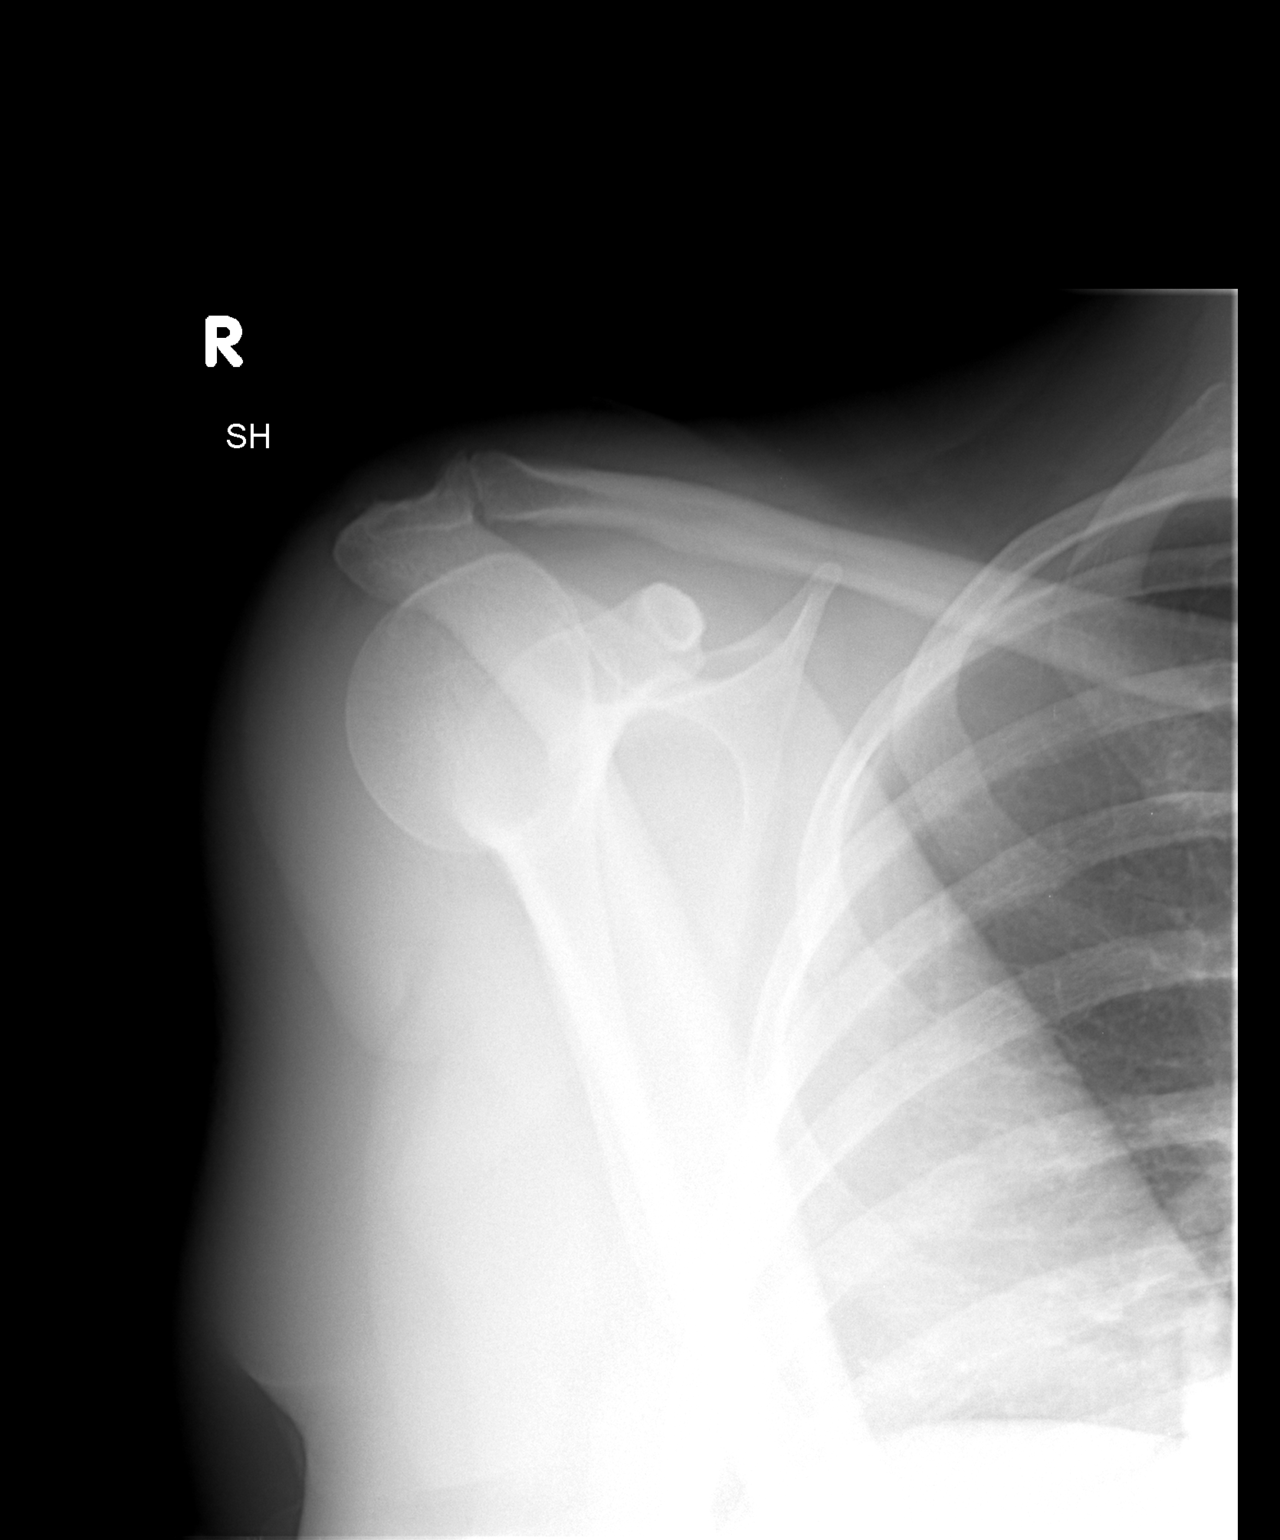

[view not recorded (3 of 3)]
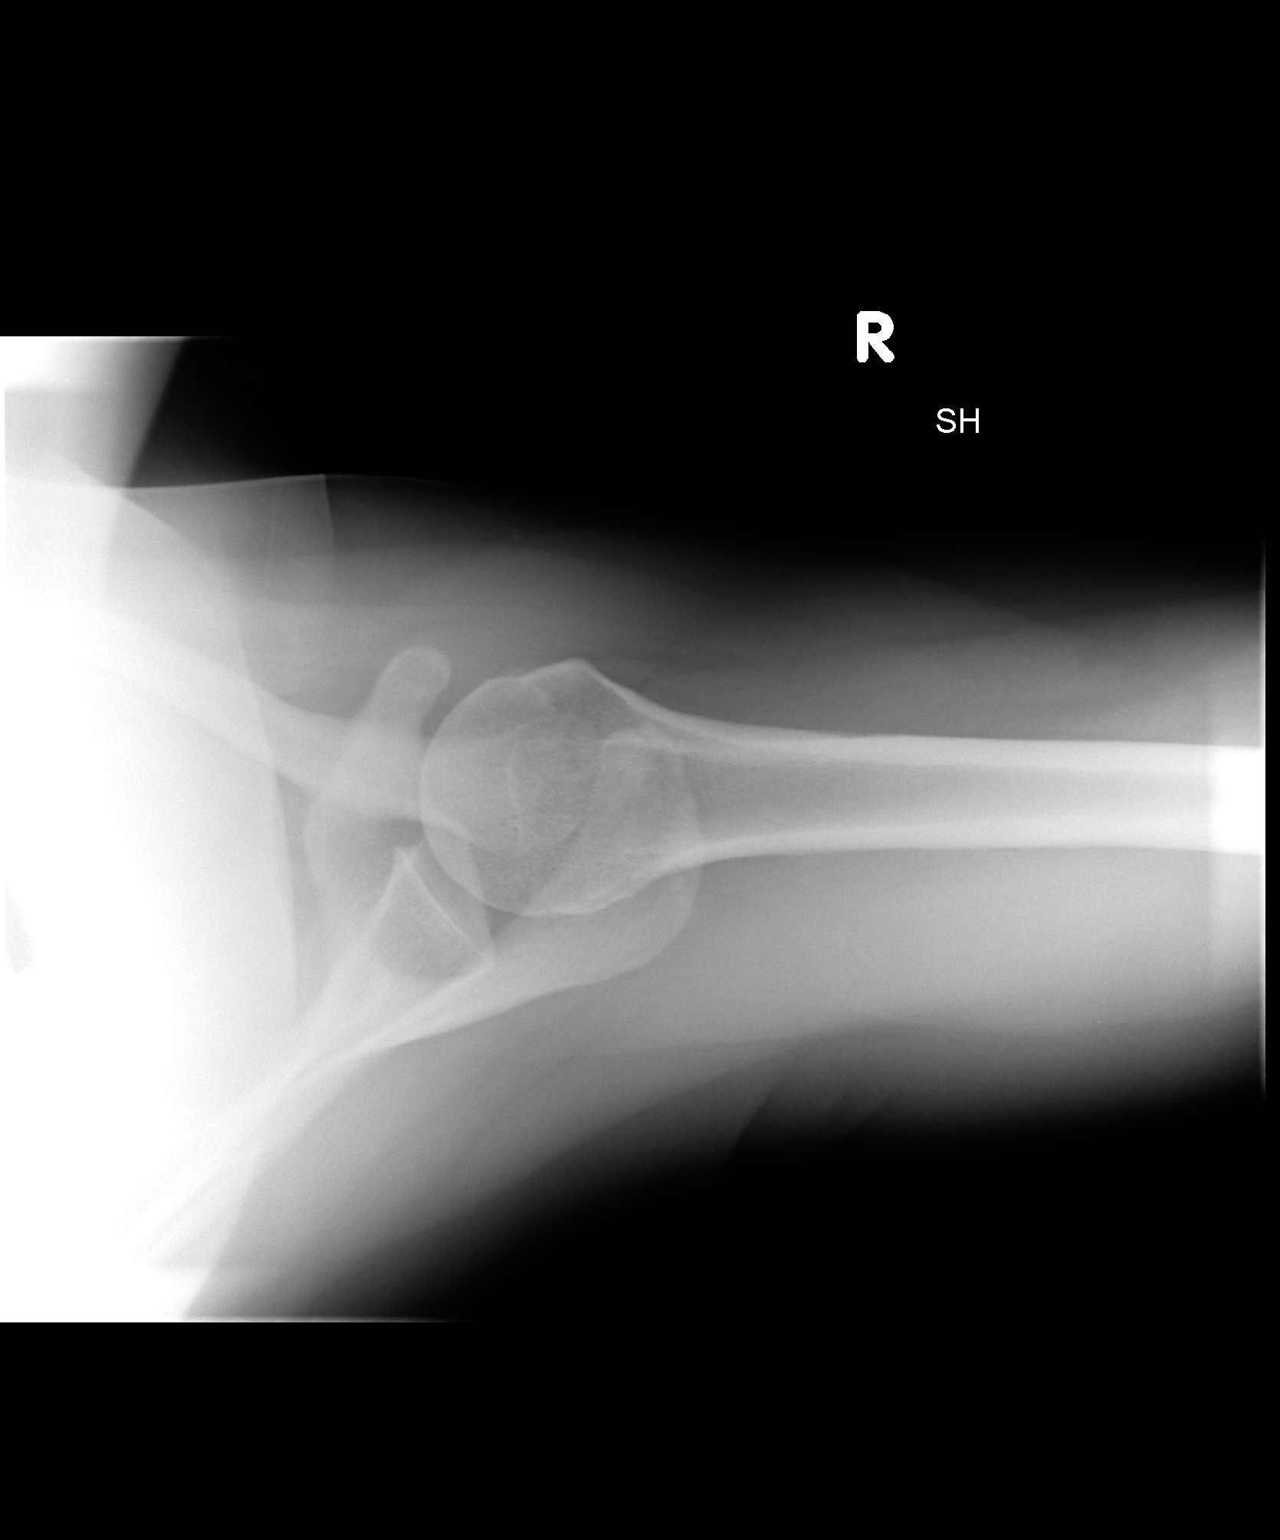

[3 of 3 positions shown; findings below may reference images not displayed]

FINDINGS: The joint spaces are maintained.  No acute bony findings.
Mild AC joint degenerative changes.  The right lung is clear.
IMPRESSION: Mild AC joint degenerative changes but no acute bony findings.

## 2015-04-06 IMAGING — US US RENAL
1 series · 14 of 25 positions shown · non-contrast
Comparison: Ultrasound dated 04/19/2012

CLINICAL DATA: Stage III chronic kidney disease.

RENAL/URINARY TRACT ULTRASOUND COMPLETE

[Series 1: us renal · 0.21mm/px · 14 of 58 slices shown]
[im 1/58]
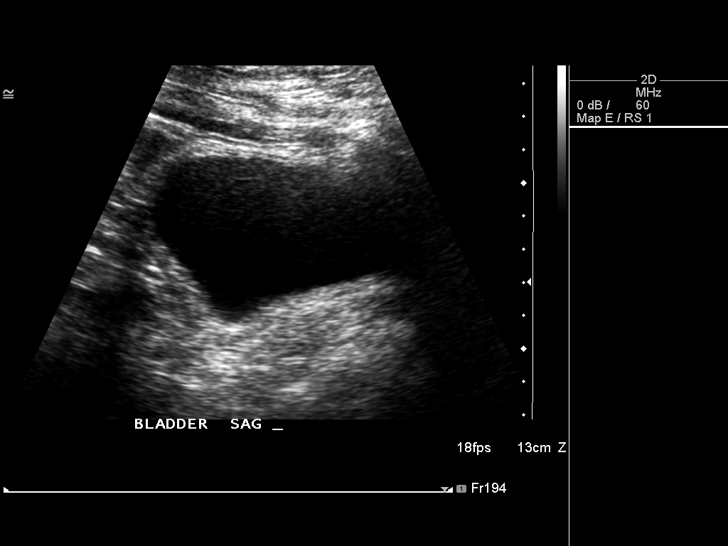
[im 5/58]
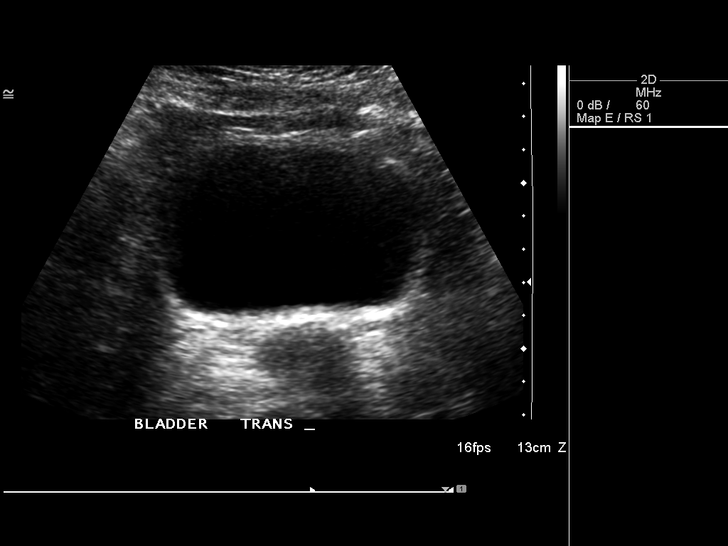
[im 10/58]
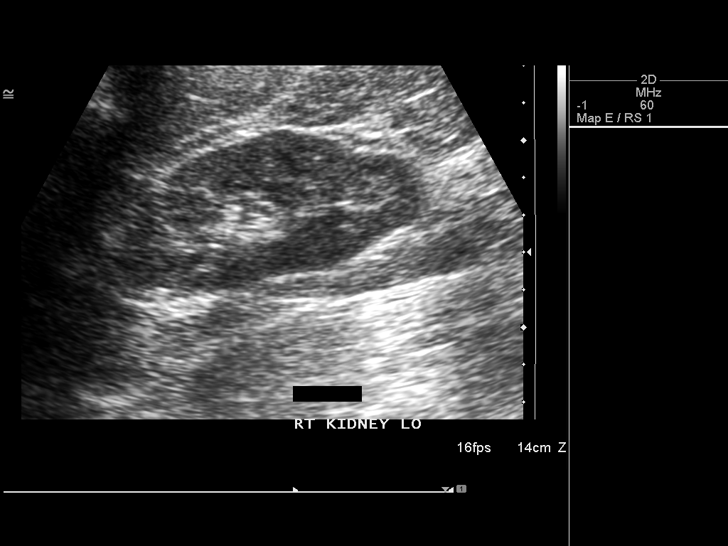
[im 15/58]
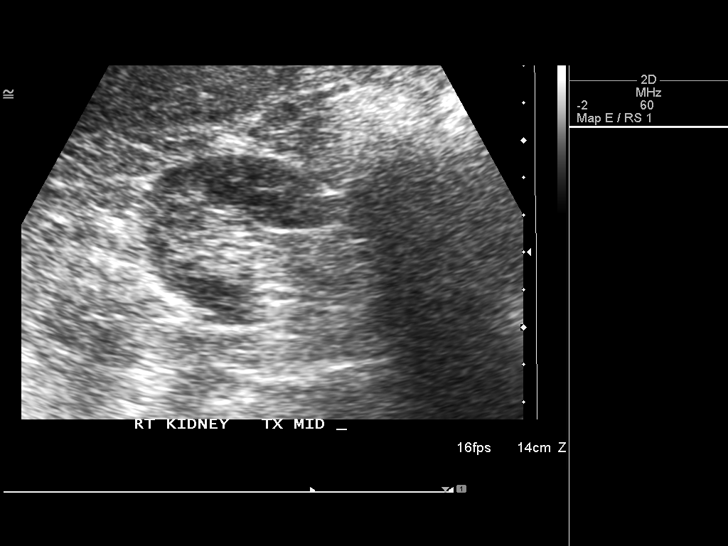
[im 20/58]
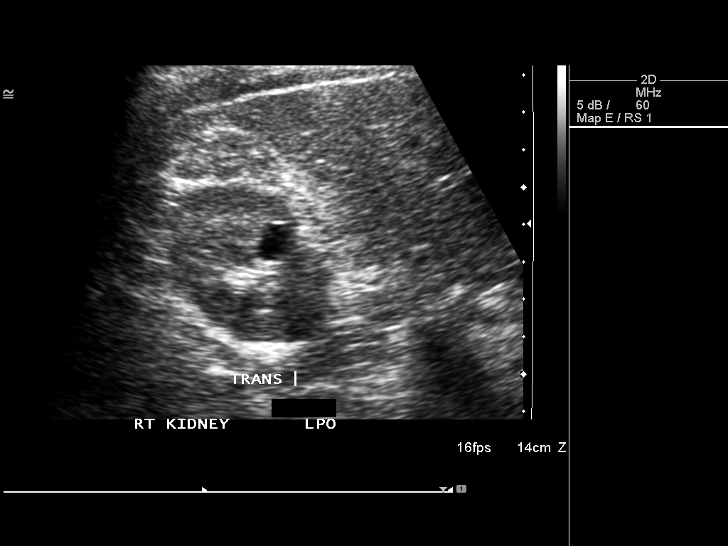
[im 22/58]
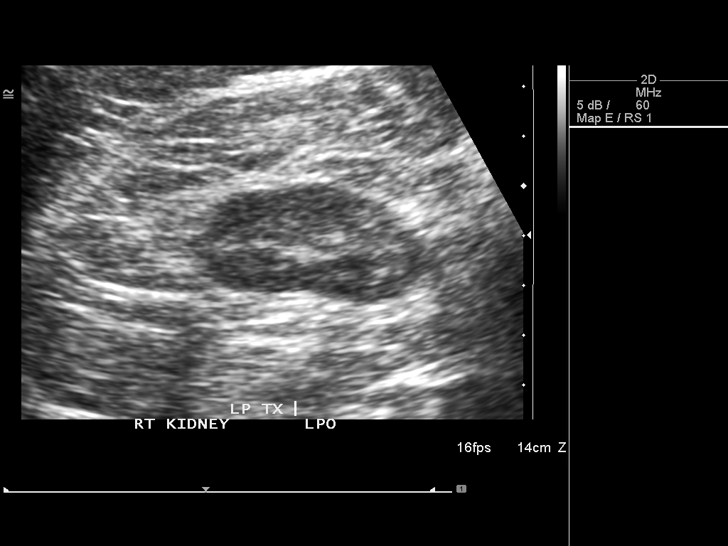
[im 27/58]
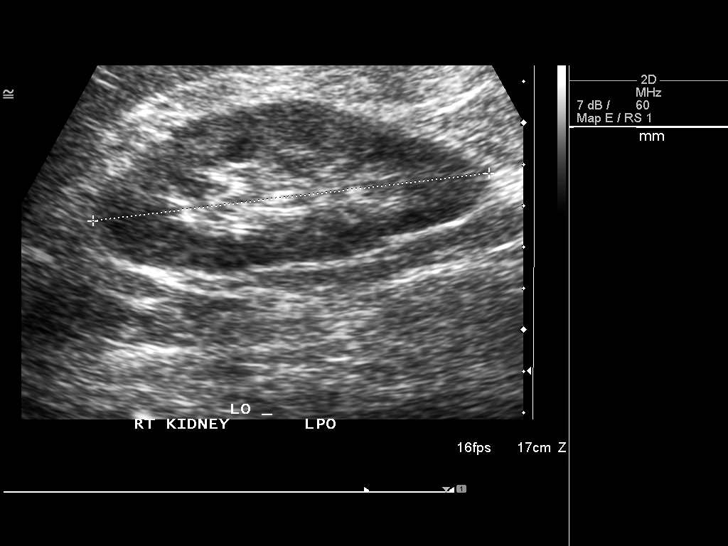
[im 31/58]
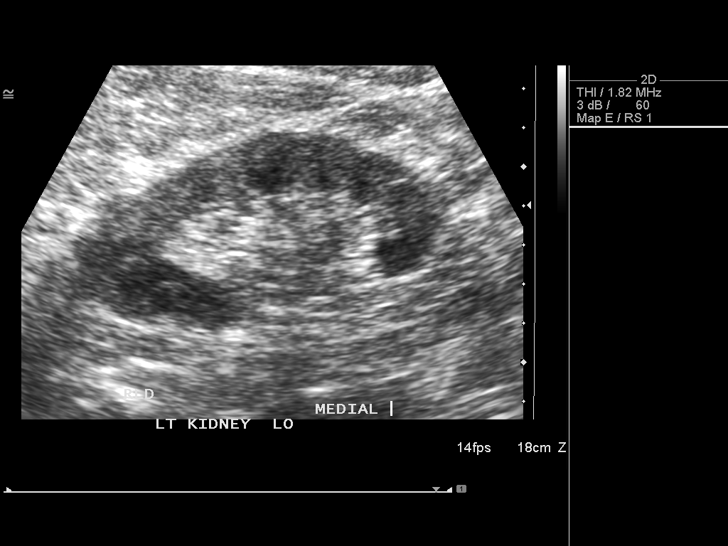
[im 36/58]
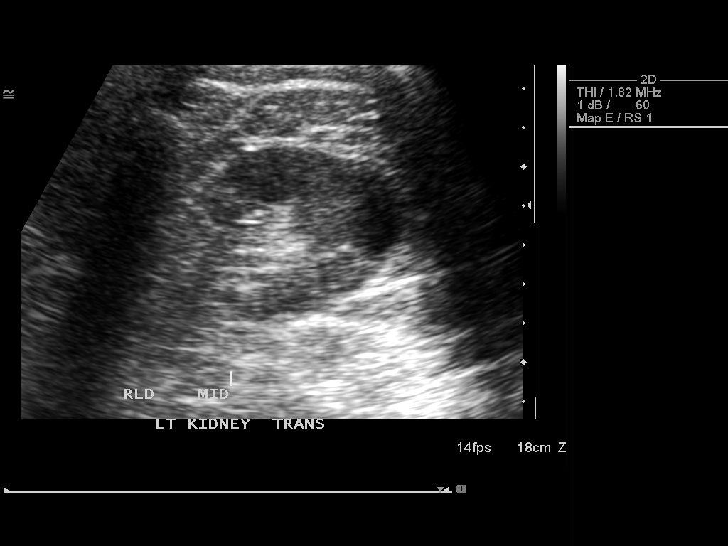
[im 39/58]
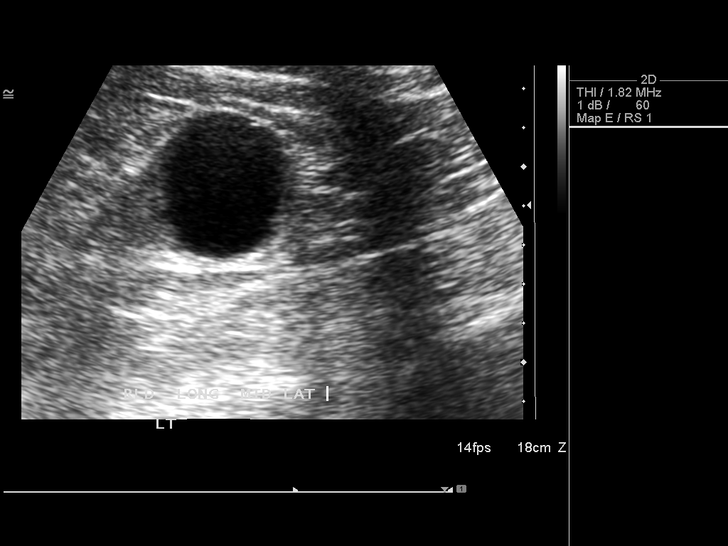
[im 43/58]
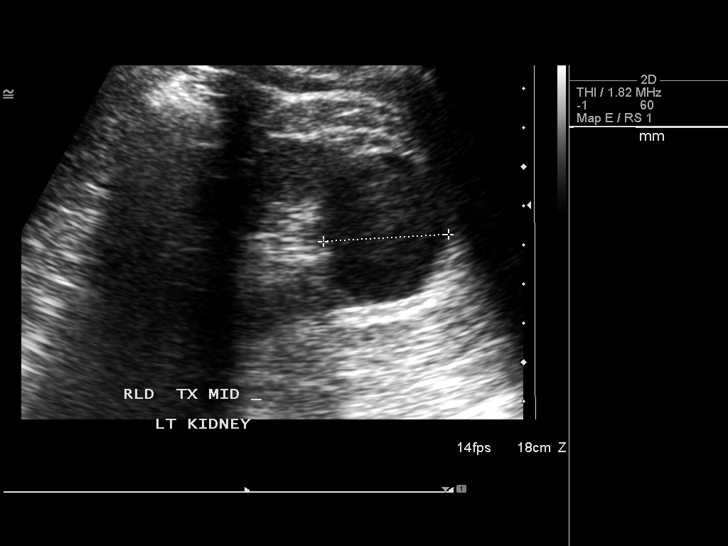
[im 48/58]
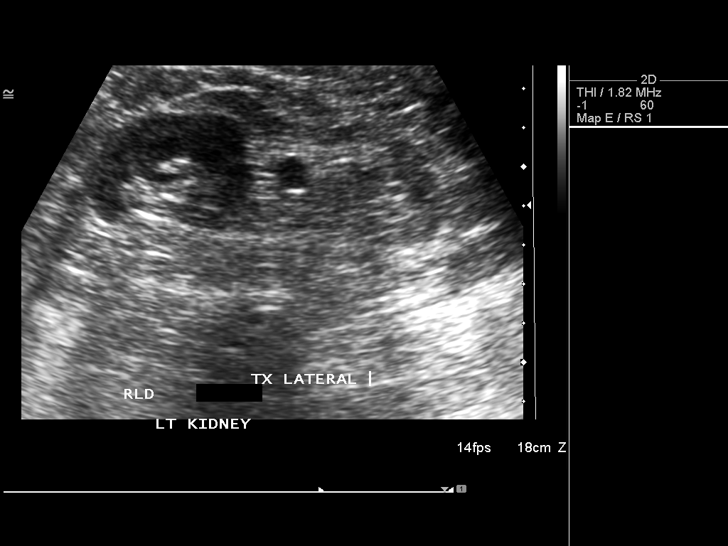
[im 53/58]
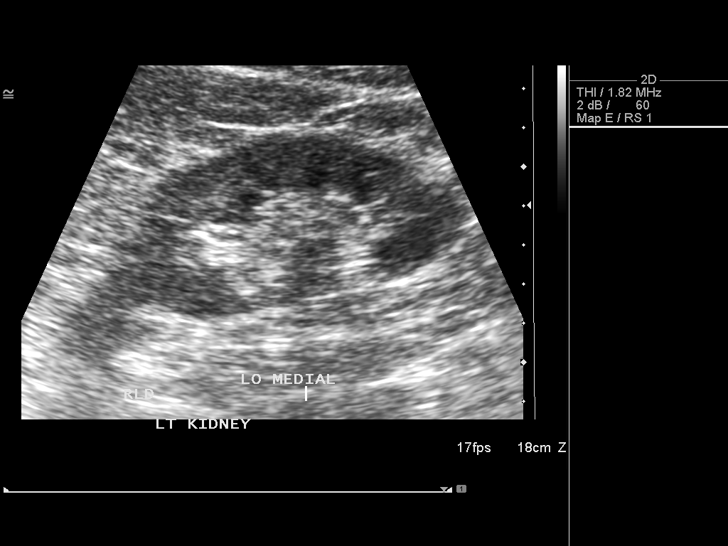
[im 58/58]
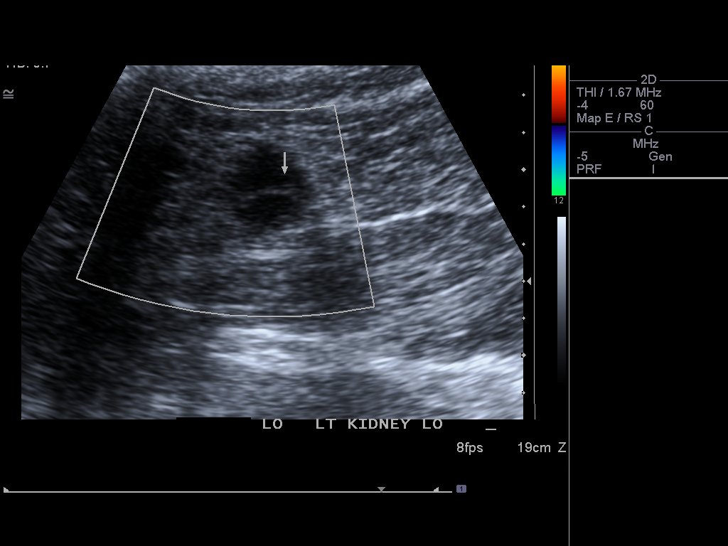

[14 of 25 positions shown; findings below may reference images not displayed]

FINDINGS: Right Kidney:  9.6 cm in length, unchanged.  1 cm cyst on the upper
pole, essentially unchanged.  No hydronephrosis.

Left Kidney:  10.2 cm in length, essentially unchanged.  3.8 cm
cyst in the mid left kidney with thin septations, unchanged.  There
is also a 1 cm cyst adjacent to the larger cyst.  This is
unchanged.

Bladder:  Normal.
IMPRESSION: No significant change in the appearance of the kidneys.  No
obstruction.  Bilateral renal cysts, stable.

## 2015-04-24 IMAGING — CR DG CERVICAL SPINE COMPLETE 4+V
6 series · 6 of 6 positions shown · non-contrast
Comparison: None.

CLINICAL DATA: Left C7 radiculitis.

CERVICAL SPINE - COMPLETE 4+ VIEW

[view not recorded (1 of 6)]
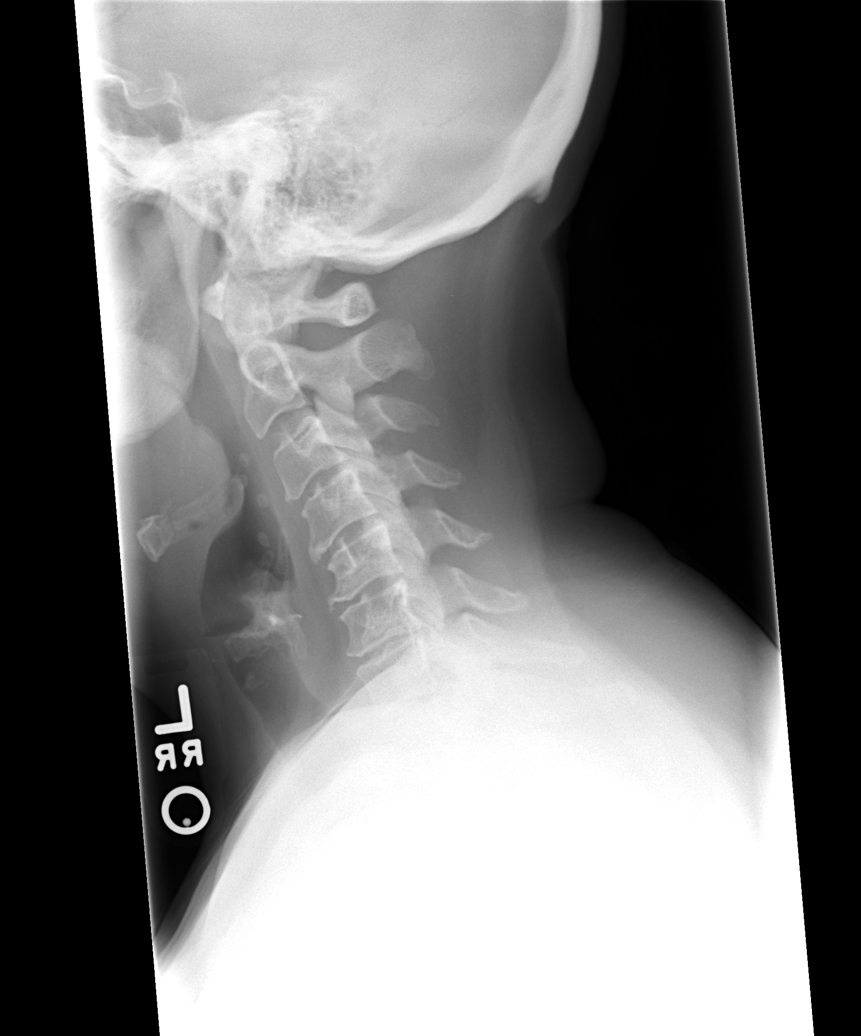

[view not recorded (2 of 6)]
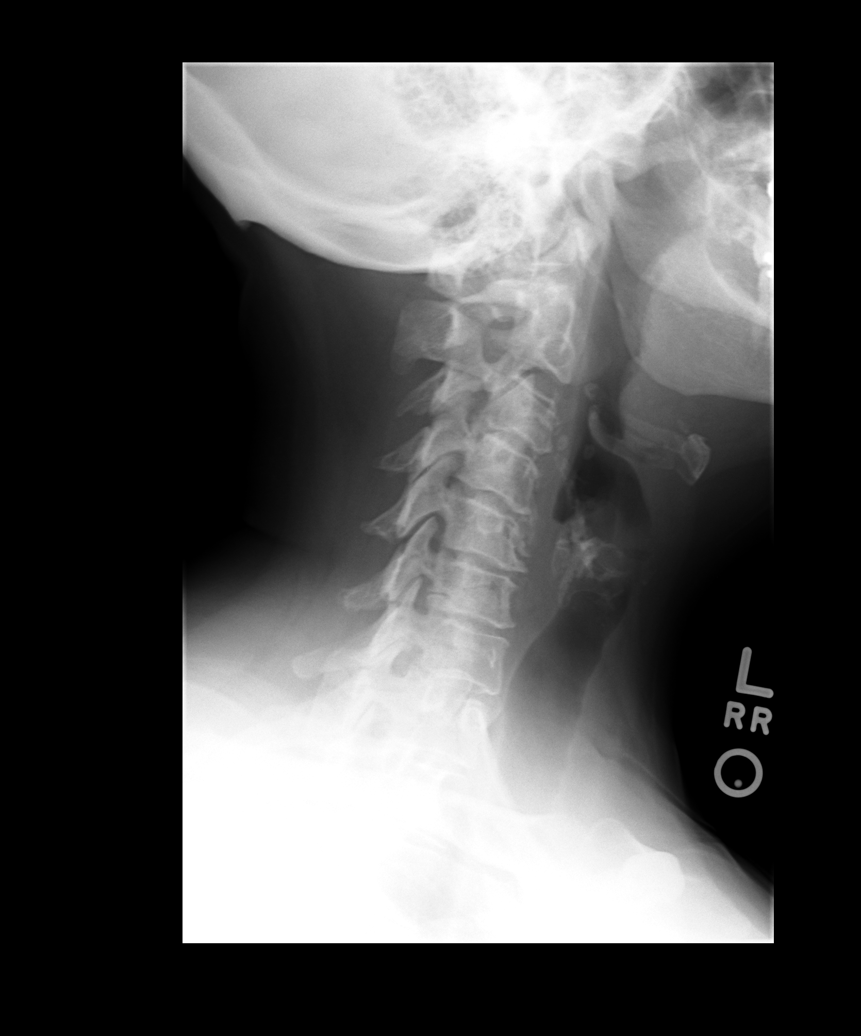

[view not recorded (3 of 6)]
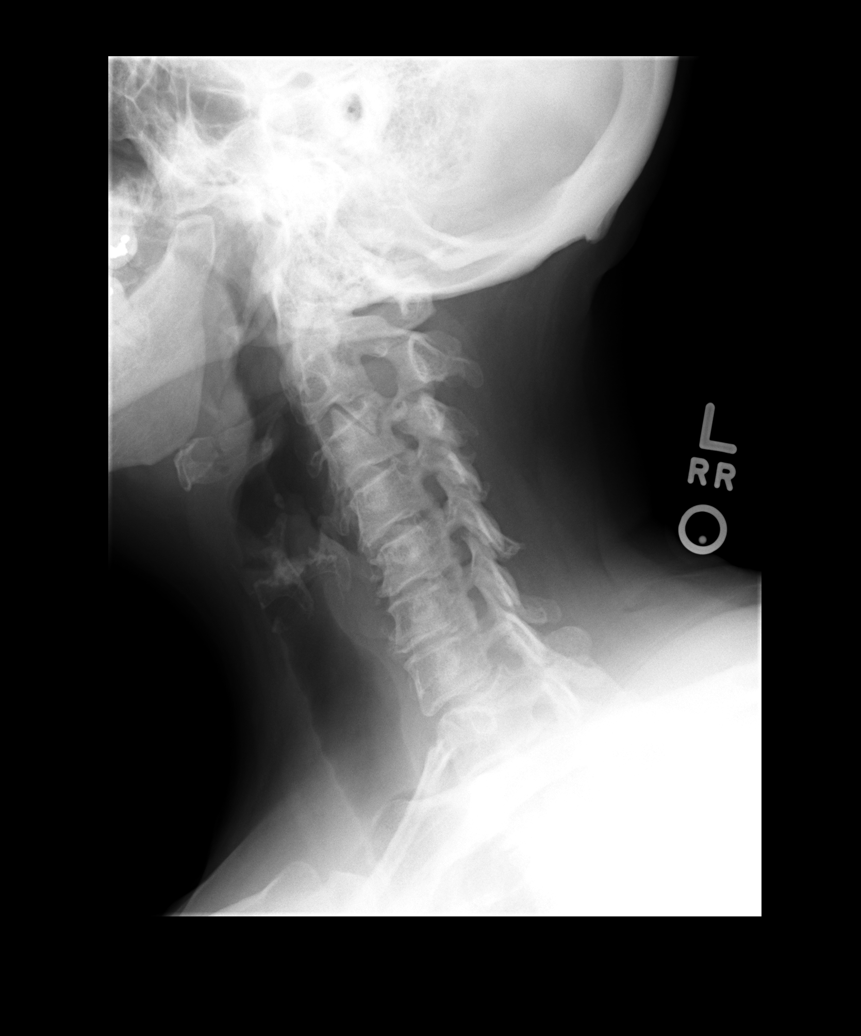

[view not recorded (4 of 6)]
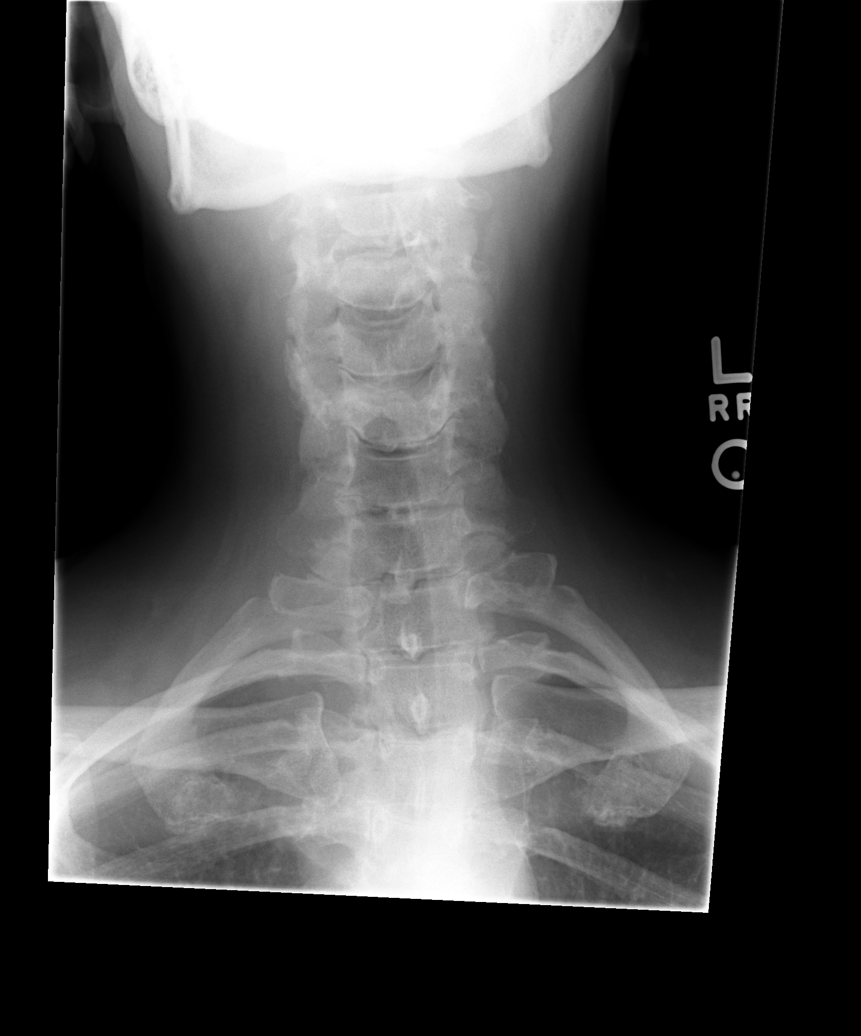

[view not recorded (5 of 6)]
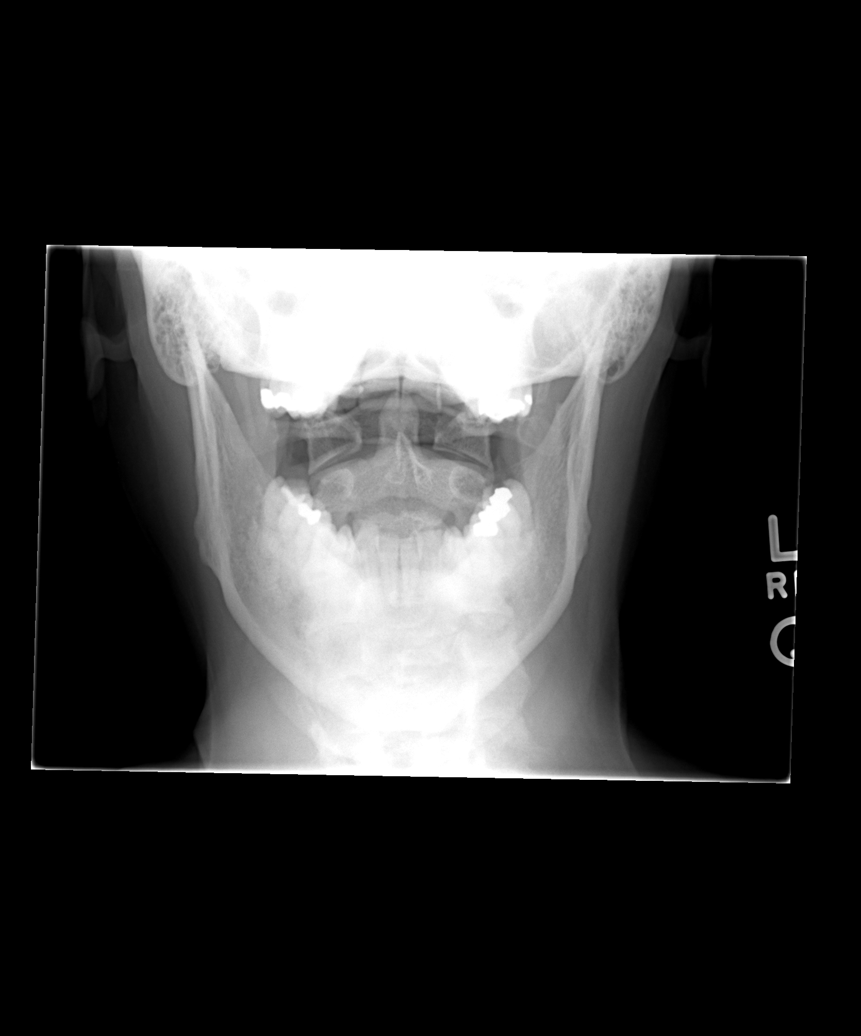

[view not recorded (6 of 6)]
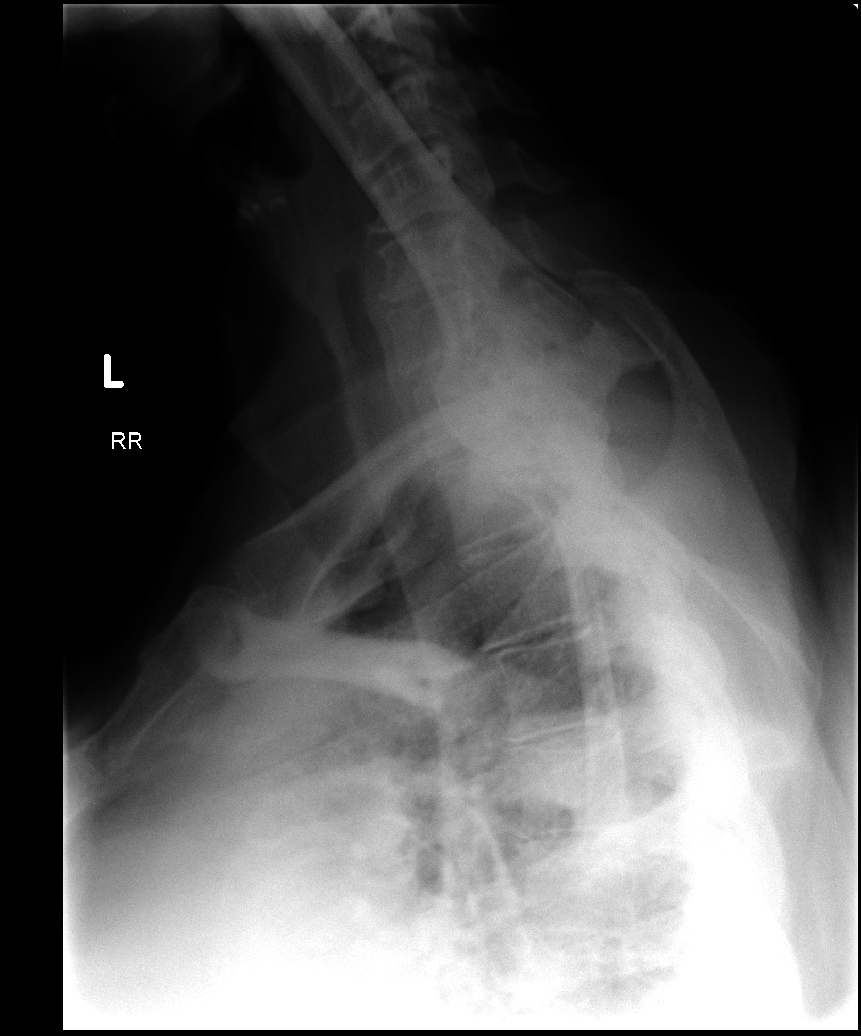

[6 of 6 positions shown; findings below may reference images not displayed]

FINDINGS: AP, lateral, odontoid, swimmer's and oblique images of
the cervical spine were obtained. Degenerative endplate changes
from C4-C7.  Disc space narrowing at C6-C7.  Mild kyphosis of the
cervical spine.  Prevertebral soft tissues are within normal
limits.  There is suggestion for bony encroachment of the right
neural foramen at C4-C5.  There is some left neural foramen
narrowing at C3-C4 and possibly C5-C6.
IMPRESSION: Multilevel cervical spondylosis.  No acute bony abnormality.

## 2015-05-14 ENCOUNTER — Encounter: Payer: Self-pay | Admitting: Family Medicine

## 2015-05-14 ENCOUNTER — Encounter: Payer: Self-pay | Admitting: Sports Medicine

## 2015-05-14 ENCOUNTER — Ambulatory Visit (INDEPENDENT_AMBULATORY_CARE_PROVIDER_SITE_OTHER): Payer: BLUE CROSS/BLUE SHIELD | Admitting: Family Medicine

## 2015-05-14 ENCOUNTER — Ambulatory Visit (INDEPENDENT_AMBULATORY_CARE_PROVIDER_SITE_OTHER): Payer: BLUE CROSS/BLUE SHIELD | Admitting: Sports Medicine

## 2015-05-14 VITALS — BP 123/76 | HR 88 | Ht 65.0 in | Wt 195.0 lb

## 2015-05-14 DIAGNOSIS — M19012 Primary osteoarthritis, left shoulder: Secondary | ICD-10-CM

## 2015-05-14 DIAGNOSIS — I10 Essential (primary) hypertension: Secondary | ICD-10-CM

## 2015-05-14 DIAGNOSIS — R9389 Abnormal findings on diagnostic imaging of other specified body structures: Secondary | ICD-10-CM

## 2015-05-14 DIAGNOSIS — M19011 Primary osteoarthritis, right shoulder: Secondary | ICD-10-CM

## 2015-05-14 DIAGNOSIS — R938 Abnormal findings on diagnostic imaging of other specified body structures: Secondary | ICD-10-CM

## 2015-05-14 DIAGNOSIS — K219 Gastro-esophageal reflux disease without esophagitis: Secondary | ICD-10-CM

## 2015-05-14 DIAGNOSIS — E785 Hyperlipidemia, unspecified: Secondary | ICD-10-CM

## 2015-05-14 MED ORDER — PREGABALIN 50 MG PO CAPS: 50.0000 mg | ORAL_CAPSULE | Freq: Three times a day (TID) | ORAL | Status: DC

## 2015-05-14 MED ORDER — TRAMADOL HCL 50 MG PO TABS: ORAL_TABLET | ORAL | Status: DC

## 2015-05-14 MED ORDER — PANTOPRAZOLE SODIUM 40 MG PO TBEC: 40.0000 mg | DELAYED_RELEASE_TABLET | Freq: Every day | ORAL | Status: DC

## 2015-05-14 NOTE — Progress Notes (Signed)
   Subjective:    Patient ID: Brianna Riley, female    DOB: May 11, 1956, 59 y.o.   MRN: [id number]  HPI Hypertension- Pt denies chest pain, SOB, dizziness, or heart palpitations.  Taking meds as directed w/o problems.  Denies medication side effects.    Hyperlipidemia - no myalgias or S.E. tolerating medication well without any side effects. No regular exercise.  GERD - well controlled on protonix. No side effects.  Uses it PRN.    Abnormal CAD screen at [facility].  It shows that there was some mild blockage on the left side.   Review of Systems     Objective:   Physical Exam  Constitutional: She is oriented to person, place, and time. She appears well-developed and well-nourished.  HENT:  Head: Normocephalic and atraumatic.  Cardiovascular: Normal rate, regular rhythm and normal heart sounds.   Pulmonary/Chest: Effort normal and breath sounds normal.  Neurological: She is alert and oriented to person, place, and time.  Skin: Skin is warm and dry.  Psychiatric: She has a normal mood and affect. Her behavior is normal.          Assessment & Plan:  HTN - well controlled.  Continue current regimen. Follow-up in 6 months. Blood work up-to-date.  Hyperlipidemia - she brought in a copy of cluster of screening that was done through [facility]. We'll get this entered into the chart. Her levels look good overall. Continue statin.  GERD - refilled the Protonix today. She  Abnormal carotid artery screening. Will get her set up for carotid Dopplers for further evaluation. Please see scanned report for [facility].

## 2015-05-14 NOTE — Progress Notes (Signed)
  Subjective:    CC: bilateral shoulder pain  HPI: This is a pleasant 59 year old female with known bilateral shoulder osteoarthritis, previous injection was in October, now having recurrence of pain that she localizes the joint lines. Moderate, persistent without radiation. She also has significant pain in multiple locations over her trapezius, back, shoulders.  Past medical history, Surgical history, Family history not pertinant except as noted below, Social history, Allergies, and medications have been entered into the medical record, reviewed, and no changes needed.   Review of Systems: No fevers, chills, night sweats, weight loss, chest pain, or shortness of breath.   Objective:    General: Well Developed, well nourished, and in no acute distress.  Neuro: Alert and oriented x3, extra-ocular muscles intact, sensation grossly intact. Tender to palpation in multiple locations of the trapezius, para lumbar muscles, paracervical, and parathoracic musculature. HEENT: Normocephalic, atraumatic, pupils equal round reactive to light, neck supple, no masses, no lymphadenopathy, thyroid nonpalpable.  Skin: Warm and dry, no rashes. Cardiac: Regular rate and rhythm, no murmurs rubs or gallops, no lower extremity edema.  Respiratory: Clear to auscultation bilaterally. Not using accessory muscles, speaking in full sentences.  Procedure: Real-time Ultrasound Guided Injection of left glenohumeral joint Device: GE Logiq E  Verbal informed consent obtained.  Time-out conducted.  Noted no overlying erythema, induration, or other signs of local infection.  Skin prepped in a sterile fashion.  Local anesthesia: Topical Ethyl chloride.  With sterile technique and under real time ultrasound guidance:  Spinal needle advanced into the joint and 1 mL kenalog 40, 2 mL lidocaine, 2 mL Marcaine injected easily. Completed without difficulty  Pain immediately resolved suggesting accurate placement of the  medication.  Advised to call if fevers/chills, erythema, induration, drainage, or persistent bleeding.  Images permanently stored and available for review in the ultrasound unit.  Impression: Technically successful ultrasound guided injection.  Procedure: Real-time Ultrasound Guided Injection of right glenohumeral joint Device: GE Logiq E  Verbal informed consent obtained.  Time-out conducted.  Noted no overlying erythema, induration, or other signs of local infection.  Skin prepped in a sterile fashion.  Local anesthesia: Topical Ethyl chloride.  With sterile technique and under real time ultrasound guidance:  Spinal needle advanced into the joint and 1 mL kenalog 40, 2 mL lidocaine, 2 mL Marcaine injected easily. Completed without difficulty  Pain immediately resolved suggesting accurate placement of the medication.  Advised to call if fevers/chills, erythema, induration, drainage, or persistent bleeding.  Images permanently stored and available for review in the ultrasound unit.  Impression: Technically successful ultrasound guided injection.  Impression and Recommendations:

## 2015-05-14 NOTE — Assessment & Plan Note (Addendum)
Recurrence of shoulder pain, previous left glenohumeral injection was only 2 months ago. Pain today's bilateral and referable to the glenohumeral joint. Bilateral glenohumeral joint injection, I'm going to add a bit of gabapentin to use as needed as she did have multiple painful trigger points suggestive of myofascial pain syndrome/fibromyalgia. Injection today was a bit early, if we don't get sufficient duration of response with the addition of Lyrica, she will be a candidate for total shoulder arthroplasty. 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

## 2015-05-15 ENCOUNTER — Telehealth: Payer: Self-pay

## 2015-05-15 NOTE — Telephone Encounter (Signed)
FYI Patient called and reports she will not take the Lyrica due to all the side effects. She does understand she probably will not get most of the side effects. She does not want to take any chances.

## 2015-06-17 ENCOUNTER — Other Ambulatory Visit: Payer: Self-pay | Admitting: Family Medicine

## 2015-06-17 DIAGNOSIS — Z139 Encounter for screening, unspecified: Secondary | ICD-10-CM

## 2015-06-25 ENCOUNTER — Ambulatory Visit (HOSPITAL_BASED_OUTPATIENT_CLINIC_OR_DEPARTMENT_OTHER)
Admission: RE | Admit: 2015-06-25 | Discharge: 2015-06-25 | Disposition: A | Discharge status: Home / Self Care | Payer: BLUE CROSS/BLUE SHIELD | Source: Ambulatory Visit | Attending: Family Medicine | Admitting: Family Medicine

## 2015-06-25 DIAGNOSIS — E785 Hyperlipidemia, unspecified: Secondary | ICD-10-CM | POA: Diagnosis not present

## 2015-06-25 DIAGNOSIS — I10 Essential (primary) hypertension: Secondary | ICD-10-CM | POA: Diagnosis not present

## 2015-06-25 DIAGNOSIS — Z1231 Encounter for screening mammogram for malignant neoplasm of breast: Secondary | ICD-10-CM | POA: Insufficient documentation

## 2015-06-25 DIAGNOSIS — Z139 Encounter for screening, unspecified: Secondary | ICD-10-CM

## 2015-06-25 DIAGNOSIS — I6522 Occlusion and stenosis of left carotid artery: Secondary | ICD-10-CM | POA: Diagnosis not present

## 2015-06-25 DIAGNOSIS — R9389 Abnormal findings on diagnostic imaging of other specified body structures: Secondary | ICD-10-CM

## 2015-06-25 DIAGNOSIS — R938 Abnormal findings on diagnostic imaging of other specified body structures: Secondary | ICD-10-CM | POA: Insufficient documentation

## 2015-06-26 ENCOUNTER — Other Ambulatory Visit: Payer: Self-pay | Admitting: Family Medicine

## 2015-06-26 MED ORDER — ATORVASTATIN CALCIUM 80 MG PO TABS: 80.0000 mg | ORAL_TABLET | Freq: Every day | ORAL | Status: DC

## 2015-06-26 MED ORDER — LOSARTAN POTASSIUM 100 MG PO TABS: 100.0000 mg | ORAL_TABLET | Freq: Every day | ORAL | Status: DC

## 2015-10-08 ENCOUNTER — Ambulatory Visit: Payer: BLUE CROSS/BLUE SHIELD | Admitting: Family Medicine

## 2015-10-08 ENCOUNTER — Ambulatory Visit: Payer: BLUE CROSS/BLUE SHIELD | Admitting: Sports Medicine

## 2015-11-26 ENCOUNTER — Ambulatory Visit (INDEPENDENT_AMBULATORY_CARE_PROVIDER_SITE_OTHER): Payer: BLUE CROSS/BLUE SHIELD | Admitting: Sports Medicine

## 2015-11-26 VITALS — BP 128/80 | HR 82 | Resp 18 | Wt 188.0 lb

## 2015-11-26 DIAGNOSIS — M19012 Primary osteoarthritis, left shoulder: Secondary | ICD-10-CM | POA: Diagnosis not present

## 2015-11-26 DIAGNOSIS — M19011 Primary osteoarthritis, right shoulder: Secondary | ICD-10-CM | POA: Diagnosis not present

## 2015-11-26 NOTE — Progress Notes (Signed)
  Subjective:    CC: Bilateral shoulder pain  HPI: This is a pleasant 60 year old female with bilateral glenohumeral osteoarthritis, previous injections or 7 month ago, now having recurrence of pain on both sides with loss of motion moderate, persistent,and desires repeat bilateral injections.  Past medical history, Surgical history, Family history not pertinant except as noted below, Social history, Allergies, and medications have been entered into the medical record, reviewed, and no changes needed.   Review of Systems: No fevers, chills, night sweats, weight loss, chest pain, or shortness of breath.   Objective:    General: Well Developed, well nourished, and in no acute distress.  Neuro: Alert and oriented x3, extra-ocular muscles intact, sensation grossly intact.  HEENT: Normocephalic, atraumatic, pupils equal round reactive to light, neck supple, no masses, no lymphadenopathy, thyroid nonpalpable.  Skin: Warm and dry, no rashes. Cardiac: Regular rate and rhythm, no murmurs rubs or gallops, no lower extremity edema.  Respiratory: Clear to auscultation bilaterally. Not using accessory muscles, speaking in full sentences.  Procedure: Real-time Ultrasound Guided Injection of left glenohumeral joint Device: GE Logiq E  Verbal informed consent obtained.  Time-out conducted.  Noted no overlying erythema, induration, or other signs of local infection.  Skin prepped in a sterile fashion.  Local anesthesia: Topical Ethyl chloride.  With sterile technique and under real time ultrasound guidance:  Spinal needle advanced into the joint, 1 mL kenalog 40, 2 mL lidocaine, 2 mL Marcaine injected easily. Completed without difficulty  Pain immediately resolved suggesting accurate placement of the medication.  Advised to call if fevers/chills, erythema, induration, drainage, or persistent bleeding.  Images permanently stored and available for review in the ultrasound unit.  Impression: Technically  successful ultrasound guided injection.  Procedure: Real-time Ultrasound Guided Injection of right glenohumeral joint Device: GE Logiq E  Verbal informed consent obtained.  Time-out conducted.  Noted no overlying erythema, induration, or other signs of local infection.  Skin prepped in a sterile fashion.  Local anesthesia: Topical Ethyl chloride.  With sterile technique and under real time ultrasound guidance:  Spinal needle advanced into the joint, 1 mL kenalog 40, 2 mL lidocaine, 2 mL Marcaine injected easily. Completed without difficulty  Pain immediately resolved suggesting accurate placement of the medication.  Advised to call if fevers/chills, erythema, induration, drainage, or persistent bleeding.  Images permanently stored and available for review in the ultrasound unit.  Impression: Technically successful ultrasound guided injection.  Impression and Recommendations:

## 2015-11-26 NOTE — Assessment & Plan Note (Signed)
Bilateral glenohumeral joint injections as above, previous injections were 7 months ago. Return as needed.

## 2015-12-10 ENCOUNTER — Ambulatory Visit: Payer: BLUE CROSS/BLUE SHIELD | Admitting: Family Medicine

## 2016-01-18 ENCOUNTER — Other Ambulatory Visit: Payer: Self-pay | Admitting: Family Medicine

## 2016-01-18 DIAGNOSIS — M19011 Primary osteoarthritis, right shoulder: Secondary | ICD-10-CM

## 2016-01-18 DIAGNOSIS — M19012 Primary osteoarthritis, left shoulder: Secondary | ICD-10-CM

## 2016-01-20 ENCOUNTER — Telehealth: Payer: Self-pay

## 2016-01-20 DIAGNOSIS — M19019 Primary osteoarthritis, unspecified shoulder: Secondary | ICD-10-CM

## 2016-01-20 NOTE — Telephone Encounter (Signed)
Pt would like to know what the next step is for her shoulders. States she still in significant pain. Please advise.

## 2016-01-20 NOTE — Telephone Encounter (Signed)
At this point it's time to consider referral to shoulder surgery. If this is okay I will place the referral to Dr. Ave Filterhandler

## 2016-01-21 NOTE — Telephone Encounter (Signed)
Left VM stating surgeon is the net step and that referral will be placed. Please assist.

## 2016-01-21 NOTE — Telephone Encounter (Signed)
Referral placed.

## 2016-01-25 ENCOUNTER — Ambulatory Visit: Payer: BLUE CROSS/BLUE SHIELD | Admitting: Sports Medicine

## 2016-02-01 ENCOUNTER — Ambulatory Visit (INDEPENDENT_AMBULATORY_CARE_PROVIDER_SITE_OTHER): Payer: BLUE CROSS/BLUE SHIELD | Admitting: Sports Medicine

## 2016-02-01 DIAGNOSIS — M19012 Primary osteoarthritis, left shoulder: Secondary | ICD-10-CM | POA: Diagnosis not present

## 2016-02-01 DIAGNOSIS — M19011 Primary osteoarthritis, right shoulder: Secondary | ICD-10-CM

## 2016-02-01 NOTE — Assessment & Plan Note (Signed)
Previous glenohumeral injection only provided 2 months of relief. She didn't make her first appointment with Dr. Ave Filterhandler, we will revisit this and she will make another appointment. I have asked her to do one half of a tramadol every 4-6 hours.

## 2016-02-01 NOTE — Progress Notes (Signed)
  Subjective:    CC: Bilateral shoulder pain  HPI: This is a pleasant 60 year old female with bilateral glenohumeral osteoarthritis, she did well initially with glenohumeral injections under ultrasound guidance but unfortunately pain is started to come back sooner, she did not make her appointment with Dr. Ave Filterhandler for consideration of operative intervention.  Past medical history, Surgical history, Family history not pertinant except as noted below, Social history, Allergies, and medications have been entered into the medical record, reviewed, and no changes needed.   Review of Systems: No fevers, chills, night sweats, weight loss, chest pain, or shortness of breath.   Objective:    General: Well Developed, well nourished, and in no acute distress.  Neuro: Alert and oriented x3, extra-ocular muscles intact, sensation grossly intact.  HEENT: Normocephalic, atraumatic, pupils equal round reactive to light, neck supple, no masses, no lymphadenopathy, thyroid nonpalpable.  Skin: Warm and dry, no rashes. Cardiac: Regular rate and rhythm, no murmurs rubs or gallops, no lower extremity edema.  Respiratory: Clear to auscultation bilaterally. Not using accessory muscles, speaking in full sentences.  Impression and Recommendations:    Osteoarthritis of both shoulders Previous glenohumeral injection only provided 2 months of relief. She didn't make her first appointment with Dr. Ave Filterhandler, we will revisit this and she will make another appointment. I have asked her to do one half of a tramadol every 4-6 hours.

## 2016-02-03 DIAGNOSIS — M19012 Primary osteoarthritis, left shoulder: Secondary | ICD-10-CM | POA: Diagnosis not present

## 2016-02-04 ENCOUNTER — Other Ambulatory Visit: Payer: Self-pay | Admitting: Orthopedic Surgery

## 2016-02-09 ENCOUNTER — Other Ambulatory Visit: Payer: Self-pay | Admitting: Orthopedic Surgery

## 2016-02-09 DIAGNOSIS — M19012 Primary osteoarthritis, left shoulder: Secondary | ICD-10-CM

## 2016-02-17 ENCOUNTER — Other Ambulatory Visit: Payer: BLUE CROSS/BLUE SHIELD

## 2016-02-19 ENCOUNTER — Other Ambulatory Visit: Payer: BLUE CROSS/BLUE SHIELD

## 2016-02-24 ENCOUNTER — Other Ambulatory Visit: Payer: Self-pay | Admitting: Sports Medicine

## 2016-02-24 DIAGNOSIS — M19012 Primary osteoarthritis, left shoulder: Secondary | ICD-10-CM

## 2016-02-24 DIAGNOSIS — M19011 Primary osteoarthritis, right shoulder: Secondary | ICD-10-CM

## 2016-02-25 ENCOUNTER — Ambulatory Visit
Admission: RE | Admit: 2016-02-25 | Discharge: 2016-02-25 | Disposition: A | Discharge status: Home / Self Care | Payer: BLUE CROSS/BLUE SHIELD | Source: Ambulatory Visit | Attending: Orthopedic Surgery | Admitting: Orthopedic Surgery

## 2016-02-25 DIAGNOSIS — M19012 Primary osteoarthritis, left shoulder: Secondary | ICD-10-CM

## 2016-03-04 DIAGNOSIS — M19012 Primary osteoarthritis, left shoulder: Secondary | ICD-10-CM | POA: Diagnosis not present

## 2016-03-29 ENCOUNTER — Other Ambulatory Visit: Payer: Self-pay | Admitting: Orthopedic Surgery

## 2016-04-07 ENCOUNTER — Telehealth: Payer: Self-pay | Admitting: Family Medicine

## 2016-04-07 NOTE — Telephone Encounter (Signed)
Please call patient: I got a letter for surgical clearance with Dr. Ave Filterhandler. I have not seen her in almost a year, it was last December for her chronic medical problems I cannot clear for her surgery until she comes in to make appointments that I can make sure that her chronic medical problems are well controlled before hand.

## 2016-04-07 NOTE — Telephone Encounter (Signed)
Left information on Pt's VM, callback provided for scheduling.  

## 2016-04-19 ENCOUNTER — Ambulatory Visit (HOSPITAL_COMMUNITY)
Admission: RE | Admit: 2016-04-19 | Discharge: 2016-04-19 | Disposition: A | Discharge status: Home / Self Care | Payer: BLUE CROSS/BLUE SHIELD | Source: Ambulatory Visit | Attending: Orthopedic Surgery | Admitting: Orthopedic Surgery

## 2016-04-19 ENCOUNTER — Other Ambulatory Visit: Payer: Self-pay

## 2016-04-19 ENCOUNTER — Encounter (HOSPITAL_COMMUNITY)
Admission: RE | Admit: 2016-04-19 | Discharge: 2016-04-19 | Disposition: A | Discharge status: Home / Self Care | Payer: BLUE CROSS/BLUE SHIELD | Source: Ambulatory Visit | Attending: Orthopedic Surgery | Admitting: Orthopedic Surgery

## 2016-04-19 ENCOUNTER — Encounter (HOSPITAL_COMMUNITY): Payer: Self-pay

## 2016-04-19 DIAGNOSIS — Z0183 Encounter for blood typing: Secondary | ICD-10-CM | POA: Diagnosis not present

## 2016-04-19 DIAGNOSIS — M19012 Primary osteoarthritis, left shoulder: Secondary | ICD-10-CM | POA: Insufficient documentation

## 2016-04-19 DIAGNOSIS — Z01812 Encounter for preprocedural laboratory examination: Secondary | ICD-10-CM | POA: Insufficient documentation

## 2016-04-19 DIAGNOSIS — Z01818 Encounter for other preprocedural examination: Secondary | ICD-10-CM

## 2016-04-19 HISTORY — DX: Fatty (change of) liver, not elsewhere classified: K76.0

## 2016-04-19 HISTORY — DX: Dyspnea, unspecified: R06.00

## 2016-04-19 LAB — PROTIME-INR
INR: 0.93
Prothrombin Time: 12.5 seconds (ref 11.4–15.2)

## 2016-04-19 LAB — CBC WITH DIFFERENTIAL/PLATELET
BASOS PCT: 1 %
Basophils Absolute: 0 10*3/uL (ref 0.0–0.1)
EOS ABS: 0.2 10*3/uL (ref 0.0–0.7)
EOS PCT: 3 %
HCT: 39.8 % (ref 36.0–46.0)
HEMOGLOBIN: 12.8 g/dL (ref 12.0–15.0)
LYMPHS ABS: 2.5 10*3/uL (ref 0.7–4.0)
Lymphocytes Relative: 31 %
MCH: 29.6 pg (ref 26.0–34.0)
MCHC: 32.2 g/dL (ref 30.0–36.0)
MCV: 92.1 fL (ref 78.0–100.0)
MONOS PCT: 7 %
Monocytes Absolute: 0.6 10*3/uL (ref 0.1–1.0)
NEUTROS PCT: 60 %
Neutro Abs: 4.8 10*3/uL (ref 1.7–7.7)
PLATELETS: 222 10*3/uL (ref 150–400)
RBC: 4.32 MIL/uL (ref 3.87–5.11)
RDW: 15 % (ref 11.5–15.5)
WBC: 8.1 10*3/uL (ref 4.0–10.5)

## 2016-04-19 LAB — APTT: aPTT: 23 seconds — ABNORMAL LOW (ref 24–36)

## 2016-04-19 LAB — TYPE AND SCREEN
ABO/RH(D): O POS
ANTIBODY SCREEN: NEGATIVE

## 2016-04-19 LAB — COMPREHENSIVE METABOLIC PANEL
ALBUMIN: 3.4 g/dL — AB (ref 3.5–5.0)
ALT: 31 U/L (ref 14–54)
ANION GAP: 7 (ref 5–15)
AST: 35 U/L (ref 15–41)
Alkaline Phosphatase: 102 U/L (ref 38–126)
BUN: 18 mg/dL (ref 6–20)
CHLORIDE: 112 mmol/L — AB (ref 101–111)
CO2: 24 mmol/L (ref 22–32)
Calcium: 9.7 mg/dL (ref 8.9–10.3)
Creatinine, Ser: 1.11 mg/dL — ABNORMAL HIGH (ref 0.44–1.00)
GFR calc non Af Amer: 53 mL/min — ABNORMAL LOW (ref >60)
GLUCOSE: 99 mg/dL (ref 65–99)
Potassium: 4.1 mmol/L (ref 3.5–5.1)
SODIUM: 143 mmol/L (ref 135–145)
Total Bilirubin: 0.6 mg/dL (ref 0.3–1.2)
Total Protein: 6 g/dL — ABNORMAL LOW (ref 6.5–8.1)

## 2016-04-19 LAB — URINALYSIS, ROUTINE W REFLEX MICROSCOPIC
BILIRUBIN URINE: NEGATIVE
Glucose, UA: NEGATIVE mg/dL
HGB URINE DIPSTICK: NEGATIVE
KETONES UR: NEGATIVE mg/dL
Leukocytes, UA: NEGATIVE
NITRITE: NEGATIVE
PROTEIN: NEGATIVE mg/dL
Specific Gravity, Urine: 1.007 (ref 1.005–1.030)
pH: 6 (ref 5.0–8.0)

## 2016-04-19 LAB — ABO/RH: ABO/RH(D): O POS

## 2016-04-19 LAB — SURGICAL PCR SCREEN
MRSA, PCR: NEGATIVE
Staphylococcus aureus: NEGATIVE

## 2016-04-19 NOTE — Pre-Procedure Instructions (Signed)
Brianna Riley  04/19/2016      [facility] - [place], [place] - [address] [place] [id number] Phone: [phone] Fax: [phone]    Your procedure is scheduled on Thursday, April 21, 2016.  [facility] at 0530 A.M.  Call this number if you have problems the morning of surgery:  [phone]   Remember:  Do not eat food or drink liquids after midnight.  Take these medicines the morning of surgery with A SIP OF WATER Pantoprazole (Protonix), Pregabablin (Lyrica)    STOP taking Aspirin and Aspirin-related Products, Ibuprofen, Aleve, Advil, Motrin, Goody Powders, BCs, Herbal medications, Fish Oil, Flaxseed, Garlic, Vitamins.   Do not wear jewelry, make-up or nail polish.  Do not wear lotions, powders, or perfumes, or deoderant.  Do not shave 48 hours prior to surgery.  Men may shave face and neck.  Do not bring valuables to the hospital.  [facility] is not responsible for any belongings or valuables.  Contacts, dentures or bridgework may not be worn into surgery.  Leave your suitcase in the car.  After surgery it may be brought to your room.  For patients admitted to the hospital, discharge time will be determined by your treatment team.  Patients discharged the day of surgery will not be allowed to drive home.   Name and phone number of your driver:     Special instructions:  Deal - Preparing for Surgery  Before surgery, you can play an important role.  Because skin is not sterile, your skin needs to be as free of germs as possible.  You can reduce the number of germs on you skin by washing with CHG (chlorahexidine gluconate) soap before surgery.  CHG is an antiseptic cleaner which kills germs and bonds with the skin to continue killing germs even after washing.  Please DO NOT use if you have an allergy to CHG or antibacterial soaps.  If your skin becomes reddened/irritated  stop using the CHG and inform your nurse when you arrive at Short Stay.  Do not shave (including legs and underarms) for at least 48 hours prior to the first CHG shower.  You may shave your face.  Please follow these instructions carefully:   1.  Shower with CHG Soap the night before surgery and the                                morning of Surgery.  2.  If you choose to wash your hair, wash your hair first as usual with your       normal shampoo.  3.  After you shampoo, rinse your hair and body thoroughly to remove the                      Shampoo.  4.  Use CHG as you would any other liquid soap.  You can apply chg directly       to the skin and wash gently with scrungie or a clean washcloth.  5.  Apply the CHG Soap to your body ONLY FROM THE NECK DOWN.        Do not use on open wounds or open sores.  Avoid contact with your eyes,       ears, mouth and genitals (private parts).  Wash genitals (private parts)  with your normal soap.  6.  Wash thoroughly, paying special attention to the area where your surgery        will be performed.  7.  Thoroughly rinse your body with warm water from the neck down.  8.  DO NOT shower/wash with your normal soap after using and rinsing off       the CHG Soap.  9.  Pat yourself dry with a clean towel.            10.  Wear clean pajamas.            11.  Place clean sheets on your bed the night of your first shower and do not        sleep with pets.  Day of Surgery  Do not apply any lotions/deoderants the morning of surgery.  Please wear clean clothes to the hospital/surgery center.     Please read over the following fact sheets that you were given. MRSA Information and Surgical Site Infection Prevention

## 2016-04-20 ENCOUNTER — Other Ambulatory Visit: Payer: Self-pay | Admitting: Family Medicine

## 2016-04-20 ENCOUNTER — Ambulatory Visit (INDEPENDENT_AMBULATORY_CARE_PROVIDER_SITE_OTHER): Payer: BLUE CROSS/BLUE SHIELD | Admitting: Family Medicine

## 2016-04-20 ENCOUNTER — Telehealth: Payer: Self-pay | Admitting: *Deleted

## 2016-04-20 ENCOUNTER — Encounter: Payer: Self-pay | Admitting: Family Medicine

## 2016-04-20 VITALS — BP 102/61 | HR 86 | Wt 183.0 lb

## 2016-04-20 DIAGNOSIS — E7800 Pure hypercholesterolemia, unspecified: Secondary | ICD-10-CM | POA: Diagnosis not present

## 2016-04-20 DIAGNOSIS — M19019 Primary osteoarthritis, unspecified shoulder: Secondary | ICD-10-CM

## 2016-04-20 DIAGNOSIS — I10 Essential (primary) hypertension: Secondary | ICD-10-CM

## 2016-04-20 DIAGNOSIS — R7301 Impaired fasting glucose: Secondary | ICD-10-CM

## 2016-04-20 DIAGNOSIS — N1832 Chronic kidney disease, stage 3b: Secondary | ICD-10-CM

## 2016-04-20 DIAGNOSIS — N183 Chronic kidney disease, stage 3 (moderate): Secondary | ICD-10-CM | POA: Diagnosis not present

## 2016-04-20 DIAGNOSIS — K219 Gastro-esophageal reflux disease without esophagitis: Secondary | ICD-10-CM

## 2016-04-20 MED ORDER — ATORVASTATIN CALCIUM 80 MG PO TABS: 80.0000 mg | ORAL_TABLET | Freq: Every day | ORAL | 3 refills | Status: DC

## 2016-04-20 MED ORDER — LOSARTAN POTASSIUM 50 MG PO TABS: 50.0000 mg | ORAL_TABLET | Freq: Every day | ORAL | 1 refills | Status: DC

## 2016-04-20 MED ORDER — PANTOPRAZOLE SODIUM 40 MG PO TBEC: 40.0000 mg | DELAYED_RELEASE_TABLET | Freq: Every day | ORAL | 5 refills | Status: DC

## 2016-04-20 NOTE — Progress Notes (Signed)
Anesthesia chart review: Patient is a 60 year old female scheduled for left total shoulder arthroplasty on 04/21/2016 by Dr. Ave Filterhandler.  History includes former smoker, hypertension, hyperlipidemia, depression, GERD, osteoarthritis, insomnia, fatty liver, migraines, dyspnea, abnormal LFTs. Normal coronaries in 2008.  Meds include aspirin 325 mg, Lipitor, fish oil, flaxseed, garlic oil, melatonin, milk thistle, niacin, Protonix, Lyrica, tramadol.  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 Possible lateral infarct changes have been present since at least 03/08/13.  10/20/08 Stress Echo: Baseline: - LV size was normal. - LV global systolic function was normal. - Normal wall motion; no LV regional wall motion abnormalities.  Wall motion score: 1.00. Peak stress: - LV size was reduced appropriately. - LV global systolic function was normal. - Normal wall motion; no LV regional wall motion abnormalities.  Wall motion score: 1.00. Stress echo results: At maximal workload the LV became hyperdynamic without the development of new wall motion abnormalities. Impressions: - There was no inducible ischemia for maximal workload. Conclusions limited in that patient did not achieve target HR. Recommendations: Consider other stress modality (dobutamine, adenosine).   09/08/06 Cardiac cath (Dr. Tonny BollmanMichael Cooper): ASSESSMENT: 1. Normal coronary arteries. 2. Normal left ventricular systolic and diastolic function. PLAN:  Ms. Luisa Hartatrick should continue with primary risk reduction. She has no evidence of coronary artery disease or left ventricular dysfunction.  06/25/15 Carotid U/S: IMPRESSION: 1. Noncalcified plaque in the proximal left ICA with estimated stenosis of less than 50%.  Stenosis may be close to the 50% level. 2. Mild noncalcified plaque in the right carotid bulb. No evidence of right ICA stenosis.  04/19/16 CXR: IMPRESSION: No active cardiopulmonary disease.  09/09/14 Spirometry: FVC 2.22 (68%), FEV1 1.86 (73%).   Preoperative labs noted. Cr 1.11. Glucose 99. AST/ALT 35/31. CBC, INR, UA WNL.   PCP is Dr. Nani Gasseratherine Metheney. She saw patient this morning and cleared her for surgery. If no acute changes then I anticipate that she can proceed as planned.  Velna Ochsllison Kamarion Zagami, PA-C [facility]Anesthesiology Phone [phone] 04/20/2016 12:26 PM

## 2016-04-20 NOTE — Telephone Encounter (Signed)
Ov notes for surgical clearance faxed. Confirmation received.Brianna PacasBarkley, Brianna Sharpley SalonaLynetta

## 2016-04-20 NOTE — Patient Instructions (Signed)
Please schedule your breathing test sometime in January. It was last done in April 2016.

## 2016-04-20 NOTE — Progress Notes (Signed)
Subjective:    Patient ID: Brianna Riley, female    DOB: [birth year], 60 y.o.   MRN: [id number]  HPI  I am seeing this patient for pre-surgical clearance for consultation for Dr. Tamera Punt.   60 year old female with a history of hypertension, chronic kidney disease, hyperlipidemia comes in today for presurgical clearance. She is scheduled for a left TSA on the left shoulder. She's currently being followed by Dr. Tamera Punt echo for orthopedics. She has not been seen in our office for her chronic medical conditions in almost a year. She reports that she is doing well without any chest pain shortness of breath or palpitations. She's not had any lower extremity swelling. She's been taking her medications regularly. She just said some lab work and an EKG done for her preop yesterday. She's never had any prior problems with anesthesia. She denies any recent wounds area no fevers or chills. And she stopped her aspirin and fish oil and other supplements several days ago. She is only taking her blood pressure pill and her cholesterol pill.   Review of Systems  Constitutional: Negative for chills and fever.  HENT: Negative for congestion, sinus pain and sinus pressure.   Respiratory: Negative for shortness of breath.   Cardiovascular: Negative for chest pain and palpitations.  Gastrointestinal: Positive for constipation. Negative for abdominal pain.  Musculoskeletal: Positive for arthralgias.      BP 102/61   Pulse 86   Wt 183 lb (83 kg)   SpO2 95%   BMI 31.41 kg/m     Allergies  Allergen Reactions  . Duloxetine Other (See Comments)    Irritable   . Hctz [Hydrochlorothiazide] Other (See Comments)    Caused inc in BUN/CR    Past Medical History:  Diagnosis Date  . Allergic rhinitis, cause unspecified   . Dyspnea   . Fatty liver   . GERD (gastroesophageal reflux disease)   . Hyperlipidemia   . Hypertension, essential, benign   . Insomnia   . Knee pain, bilateral   . Lumbago   .  Migraine   . Mild depression ([place])   . Nonspecific abnormal results of liver function study   . Obesity   . Osteoarthritis   . Perimenopausal     Past Surgical History:  Procedure Laterality Date  . CARDIAC CATHETERIZATION  09/2006   [place], Dr. Stanford Breed  . CATARACT EXTRACTION W/ INTRAOCULAR LENS IMPLANT  2002   Right and Left  . DOBUTAMINE STRESS ECHO  08/2008  . LAPAROSCOPY ABDOMEN DIAGNOSTIC      Social History   Social History  . Marital status: Married    Spouse name: N/A  . Number of children: N/A  . Years of education: N/A   Occupational History  . UNEMPLOYED Unemployed  . Retired    Social History Main Topics  . Smoking status: Former Smoker    Quit date: 06/06/1996  . Smokeless tobacco: Not on file  . Alcohol use No     Comment: quit two years ago  . Drug use: No  . Sexual activity: No   Other Topics Concern  . 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 Lung cancer Mother   . Heart  attack Father 27  . Drug abuse Brother     Outpatient Encounter Prescriptions as of 04/20/2016  Medication Sig  . aspirin 325 MG tablet Take 650-1,000 mg by mouth See admin instructions. Take 650 mg by mouth in the morning and take 1000 mg by mouth in the evening  . atorvastatin (LIPITOR) 80 MG tablet Take 1 tablet (80 mg total) by mouth daily.  . fish oil-omega-3 fatty acids 1000 MG capsule Take 1 capsule by mouth daily.    . Flaxseed, Linseed, (FLAX SEED OIL) 1000 MG CAPS Take 1,000 mg by mouth daily.   . Garcinia Cambogia-Chromium 500-200 MG-MCG TABS Take 1 tablet by mouth daily.   . Garlic Oil 793 MG CAPS Take 500 mg by mouth daily.   Marland Kitchen losartan (COZAAR) 100 MG tablet Take 1 tablet (100 mg total) by mouth daily.  Marland Kitchen MELATONIN PO Take 1 tablet by  mouth daily.   . milk thistle 175 MG tablet Take 175 mg by mouth daily.  . Multiple Vitamins-Iron (MULTIVITAMIN/IRON) TABS Take 1 tablet by mouth daily.    . niacin (SLO-NIACIN) 500 MG tablet Take 500 mg by mouth at bedtime.  . pantoprazole (PROTONIX) 40 MG tablet Take 1 tablet (40 mg total) by mouth daily.  . pregabalin (LYRICA) 50 MG capsule Take 1 capsule (50 mg total) by mouth 3 (three) times daily.  . traMADol (ULTRAM) 50 MG tablet take 1 to 2 tablets by mouth every 8 hours (MAX 6 TABLETS PER DAY) (Patient taking differently: Take 25-50 mg by mouth 5 times daily as needed)   No facility-administered encounter medications on file as of 04/20/2016.           Objective:   Physical Exam  Constitutional: She is oriented to person, place, and time. She appears well-developed and well-nourished.  HENT:  Head: Normocephalic and atraumatic.  Right Ear: External ear normal.  Left Ear: External ear normal.  Nose: Nose normal.  Mouth/Throat: Oropharynx is clear and moist.  TMs and canals are clear.   Eyes: Conjunctivae and EOM are normal. Pupils are equal, round, and reactive to light. Right eye exhibits no discharge. Left eye exhibits no discharge.  Neck: Neck supple. No tracheal deviation present. No thyromegaly present.  Cardiovascular: Normal rate, regular rhythm and normal heart sounds.   Pulmonary/Chest: Effort normal and breath sounds normal. She has no wheezes.  Abdominal: Soft. Bowel sounds are normal. She exhibits no distension and no mass. There is no tenderness. There is no rebound and no guarding.  Musculoskeletal: She exhibits no edema.  Lymphadenopathy:    She has no cervical adenopathy.  Neurological: She is alert and oriented to person, place, and time. She has normal reflexes. No cranial nerve deficit.  Skin: Skin is warm and dry.  Psychiatric: She has a normal mood and affect. Her behavior is normal. Thought content normal.          Assessment & Plan:   Presurgical clearance-patient is cleared for surgery. She is low cardiac risk for surgery.  Blood pressure is well regulated. She is completely asymptomatic and is not having any cardiac concerns or issues. She is able to complete all ADLs and is able to walk up and down stairs. That she says she has to go slowly.  Hypertension-in fact her blood pressures a little low today and has been when she has come in for previous appointments recently. Decrease losartan to 50 mg. Recheck blood pressure in January to make sure still at goal.  Chronic  kidney disease-will get renal function that was done yesterday to update her chart.  Stable. Follow Q 6 months.     Chemistry      Component Value Date/Time   NA 143 04/19/2016 1345   K 4.1 04/19/2016 1345   CL 112 (H) 04/19/2016 1345   CO2 24 04/19/2016 1345   BUN 18 04/19/2016 1345   CREATININE 1.11 (H) 04/19/2016 1345   CREATININE 1.10 (H) 03/10/2015 1533   GLU 80 08/13/2013      Component Value Date/Time   CALCIUM 9.7 04/19/2016 1345   ALKPHOS 102 04/19/2016 1345   AST 35 04/19/2016 1345   ALT 31 04/19/2016 1345   BILITOT 0.6 04/19/2016 1345     Hyperlipidemia-she is due to recheck lipid panel. She is taking a statin. Hepatic Function Latest Ref Rng & Units 04/19/2016 03/10/2015 07/07/2014  Total Protein 6.5 - 8.1 g/dL 6.0(L) 6.6 6.7  Albumin 3.5 - 5.0 g/dL 3.4(L) 4.0 4.1  AST 15 - 41 U/L 35 39(H) 42(H)  ALT 14 - 54 U/L 31 39(H) 49(H)  Alk Phosphatase 38 - 126 U/L 102 129 124(H)  Total Bilirubin 0.3 - 1.2 mg/dL 0.6 0.3 0.3

## 2016-04-21 ENCOUNTER — Inpatient Hospital Stay (HOSPITAL_COMMUNITY)
Admission: RE | Admit: 2016-04-21 | Discharge: 2016-04-22 | DRG: 483 | Disposition: A | Discharge status: Home / Self Care | Payer: BLUE CROSS/BLUE SHIELD | Source: Ambulatory Visit | Attending: Orthopedic Surgery | Admitting: Orthopedic Surgery

## 2016-04-21 ENCOUNTER — Encounter (HOSPITAL_COMMUNITY)
Admission: RE | Disposition: A | Discharge status: Home / Self Care | Payer: Self-pay | Source: Ambulatory Visit | Attending: Orthopedic Surgery

## 2016-04-21 ENCOUNTER — Inpatient Hospital Stay (HOSPITAL_COMMUNITY): Payer: BLUE CROSS/BLUE SHIELD | Admitting: Vascular Surgery

## 2016-04-21 ENCOUNTER — Inpatient Hospital Stay (HOSPITAL_COMMUNITY): Payer: BLUE CROSS/BLUE SHIELD

## 2016-04-21 ENCOUNTER — Encounter (HOSPITAL_COMMUNITY): Payer: Self-pay | Admitting: Anesthesiology

## 2016-04-21 ENCOUNTER — Inpatient Hospital Stay (HOSPITAL_COMMUNITY): Payer: BLUE CROSS/BLUE SHIELD | Admitting: Anesthesiology

## 2016-04-21 DIAGNOSIS — Z96612 Presence of left artificial shoulder joint: Secondary | ICD-10-CM

## 2016-04-21 DIAGNOSIS — E669 Obesity, unspecified: Secondary | ICD-10-CM | POA: Diagnosis not present

## 2016-04-21 DIAGNOSIS — I10 Essential (primary) hypertension: Secondary | ICD-10-CM | POA: Diagnosis present

## 2016-04-21 DIAGNOSIS — Z888 Allergy status to other drugs, medicaments and biological substances status: Secondary | ICD-10-CM

## 2016-04-21 DIAGNOSIS — E785 Hyperlipidemia, unspecified: Secondary | ICD-10-CM | POA: Diagnosis present

## 2016-04-21 DIAGNOSIS — Z801 Family history of malignant neoplasm of trachea, bronchus and lung: Secondary | ICD-10-CM | POA: Diagnosis not present

## 2016-04-21 DIAGNOSIS — F329 Major depressive disorder, single episode, unspecified: Secondary | ICD-10-CM | POA: Diagnosis present

## 2016-04-21 DIAGNOSIS — Z79899 Other long term (current) drug therapy: Secondary | ICD-10-CM | POA: Diagnosis not present

## 2016-04-21 DIAGNOSIS — Z8249 Family history of ischemic heart disease and other diseases of the circulatory system: Secondary | ICD-10-CM | POA: Diagnosis not present

## 2016-04-21 DIAGNOSIS — G8918 Other acute postprocedural pain: Secondary | ICD-10-CM | POA: Diagnosis not present

## 2016-04-21 DIAGNOSIS — K760 Fatty (change of) liver, not elsewhere classified: Secondary | ICD-10-CM | POA: Diagnosis not present

## 2016-04-21 DIAGNOSIS — Z6831 Body mass index (BMI) 31.0-31.9, adult: Secondary | ICD-10-CM | POA: Diagnosis not present

## 2016-04-21 DIAGNOSIS — J309 Allergic rhinitis, unspecified: Secondary | ICD-10-CM | POA: Diagnosis not present

## 2016-04-21 DIAGNOSIS — Z87891 Personal history of nicotine dependence: Secondary | ICD-10-CM | POA: Diagnosis not present

## 2016-04-21 DIAGNOSIS — M19012 Primary osteoarthritis, left shoulder: Principal | ICD-10-CM | POA: Diagnosis present

## 2016-04-21 DIAGNOSIS — K219 Gastro-esophageal reflux disease without esophagitis: Secondary | ICD-10-CM | POA: Diagnosis present

## 2016-04-21 DIAGNOSIS — Z7982 Long term (current) use of aspirin: Secondary | ICD-10-CM

## 2016-04-21 DIAGNOSIS — Z471 Aftercare following joint replacement surgery: Secondary | ICD-10-CM | POA: Diagnosis not present

## 2016-04-21 HISTORY — PX: TOTAL SHOULDER ARTHROPLASTY: SHX126

## 2016-04-21 SURGERY — ARTHROPLASTY, SHOULDER, TOTAL
Anesthesia: General | Site: Shoulder | Laterality: Left

## 2016-04-21 MED ORDER — DOCUSATE SODIUM 100 MG PO CAPS
100.0000 mg | ORAL_CAPSULE | Freq: Two times a day (BID) | ORAL | Status: DC
  Administered 2016-04-22: 100 mg via ORAL
  Filled 2016-04-21: qty 1

## 2016-04-21 MED ORDER — PANTOPRAZOLE SODIUM 40 MG PO TBEC
40.0000 mg | DELAYED_RELEASE_TABLET | Freq: Every day | ORAL | Status: DC
  Administered 2016-04-22: 40 mg via ORAL
  Filled 2016-04-21 (×2): qty 1

## 2016-04-21 MED ORDER — FENTANYL CITRATE (PF) 100 MCG/2ML IJ SOLN
INTRAMUSCULAR | Status: AC
  Filled 2016-04-21: qty 4

## 2016-04-21 MED ORDER — POLYETHYLENE GLYCOL 3350 17 G PO PACK: 17.0000 g | PACK | Freq: Every day | ORAL | Status: DC | PRN

## 2016-04-21 MED ORDER — OXYCODONE HCL 5 MG PO TABS
5.0000 mg | ORAL_TABLET | ORAL | Status: DC | PRN
  Administered 2016-04-21 – 2016-04-22 (×3): 10 mg via ORAL
  Filled 2016-04-21 (×2): qty 2

## 2016-04-21 MED ORDER — ZOLPIDEM TARTRATE 5 MG PO TABS: 5.0000 mg | ORAL_TABLET | Freq: Every evening | ORAL | Status: DC | PRN

## 2016-04-21 MED ORDER — ATORVASTATIN CALCIUM 80 MG PO TABS
80.0000 mg | ORAL_TABLET | Freq: Every day | ORAL | Status: DC
  Administered 2016-04-21 – 2016-04-22 (×2): 80 mg via ORAL
  Filled 2016-04-21 (×2): qty 1

## 2016-04-21 MED ORDER — ONDANSETRON HCL 4 MG/2ML IJ SOLN
INTRAMUSCULAR | Status: DC | PRN
Start: 2016-04-21 — End: 2016-04-21
  Administered 2016-04-21: 4 mg via INTRAVENOUS

## 2016-04-21 MED ORDER — SUCCINYLCHOLINE CHLORIDE 20 MG/ML IJ SOLN
INTRAMUSCULAR | Status: DC | PRN
  Administered 2016-04-21: 100 mg via INTRAVENOUS

## 2016-04-21 MED ORDER — EPHEDRINE SULFATE 50 MG/ML IJ SOLN
INTRAMUSCULAR | Status: DC | PRN
Start: 2016-04-21 — End: 2016-04-21
  Administered 2016-04-21 (×2): 10 mg via INTRAVENOUS

## 2016-04-21 MED ORDER — LIDOCAINE 2% (20 MG/ML) 5 ML SYRINGE
INTRAMUSCULAR | Status: AC
Start: 2016-04-21 — End: 2016-04-21
  Filled 2016-04-21: qty 5

## 2016-04-21 MED ORDER — ACETAMINOPHEN 650 MG RE SUPP: 650.0000 mg | Freq: Four times a day (QID) | RECTAL | Status: DC | PRN

## 2016-04-21 MED ORDER — LIDOCAINE HCL (CARDIAC) 20 MG/ML IV SOLN
INTRAVENOUS | Status: DC | PRN
  Administered 2016-04-21: 80 mg via INTRAVENOUS

## 2016-04-21 MED ORDER — PHENOL 1.4 % MT LIQD: 1.0000 | OROMUCOSAL | Status: DC | PRN

## 2016-04-21 MED ORDER — METOCLOPRAMIDE HCL 5 MG/ML IJ SOLN: 5.0000 mg | Freq: Three times a day (TID) | INTRAMUSCULAR | Status: DC | PRN

## 2016-04-21 MED ORDER — PROPOFOL 10 MG/ML IV BOLUS
INTRAVENOUS | Status: DC | PRN
  Administered 2016-04-21: 150 mg via INTRAVENOUS

## 2016-04-21 MED ORDER — ACETAMINOPHEN 500 MG PO TABS
1000.0000 mg | ORAL_TABLET | Freq: Four times a day (QID) | ORAL | Status: AC
  Administered 2016-04-21 – 2016-04-22 (×4): 1000 mg via ORAL
  Filled 2016-04-21 (×4): qty 2

## 2016-04-21 MED ORDER — ROCURONIUM BROMIDE 100 MG/10ML IV SOLN
INTRAVENOUS | Status: DC | PRN
  Administered 2016-04-21: 50 mg via INTRAVENOUS

## 2016-04-21 MED ORDER — SODIUM CHLORIDE 0.9 % IV SOLN
INTRAVENOUS | Status: DC
  Administered 2016-04-21: 1 mL via INTRAVENOUS
  Administered 2016-04-21: 13:00:00 via INTRAVENOUS

## 2016-04-21 MED ORDER — TRANEXAMIC ACID 1000 MG/10ML IV SOLN
1000.0000 mg | INTRAVENOUS | Status: AC
  Administered 2016-04-21: 1000 mg via INTRAVENOUS
  Filled 2016-04-21: qty 10

## 2016-04-21 MED ORDER — CEFAZOLIN SODIUM-DEXTROSE 2-4 GM/100ML-% IV SOLN
2.0000 g | INTRAVENOUS | Status: AC
  Administered 2016-04-21: 2 g via INTRAVENOUS

## 2016-04-21 MED ORDER — SUCCINYLCHOLINE CHLORIDE 200 MG/10ML IV SOSY
PREFILLED_SYRINGE | INTRAVENOUS | Status: AC
  Filled 2016-04-21: qty 10

## 2016-04-21 MED ORDER — CEFAZOLIN SODIUM-DEXTROSE 2-4 GM/100ML-% IV SOLN
INTRAVENOUS | Status: AC
  Filled 2016-04-21: qty 100

## 2016-04-21 MED ORDER — PHENYLEPHRINE 40 MCG/ML (10ML) SYRINGE FOR IV PUSH (FOR BLOOD PRESSURE SUPPORT)
PREFILLED_SYRINGE | INTRAVENOUS | Status: AC
  Filled 2016-04-21: qty 10

## 2016-04-21 MED ORDER — 0.9 % SODIUM CHLORIDE (POUR BTL) OPTIME
TOPICAL | Status: DC | PRN
  Administered 2016-04-21: 1000 mL

## 2016-04-21 MED ORDER — FLEET ENEMA 7-19 GM/118ML RE ENEM: 1.0000 | ENEMA | Freq: Once | RECTAL | Status: DC | PRN

## 2016-04-21 MED ORDER — HEMOSTATIC AGENTS (NO CHARGE) OPTIME
TOPICAL | Status: DC | PRN
  Administered 2016-04-21: 1 via TOPICAL

## 2016-04-21 MED ORDER — SODIUM CHLORIDE 0.9 % IR SOLN
Status: DC | PRN
  Administered 2016-04-21: 3000 mL

## 2016-04-21 MED ORDER — ARTIFICIAL TEARS OP OINT
TOPICAL_OINTMENT | OPHTHALMIC | Status: DC | PRN
  Administered 2016-04-21: 1 via OPHTHALMIC

## 2016-04-21 MED ORDER — POVIDONE-IODINE 7.5 % EX SOLN: Freq: Once | CUTANEOUS | Status: DC

## 2016-04-21 MED ORDER — LACTATED RINGERS IV SOLN
INTRAVENOUS | Status: DC | PRN
Start: 2016-04-21 — End: 2016-04-21
  Administered 2016-04-21 (×2): via INTRAVENOUS

## 2016-04-21 MED ORDER — ALUM & MAG HYDROXIDE-SIMETH 200-200-20 MG/5ML PO SUSP: 30.0000 mL | ORAL | Status: DC | PRN

## 2016-04-21 MED ORDER — OXYCODONE HCL 5 MG PO TABS
ORAL_TABLET | ORAL | Status: AC
  Filled 2016-04-21: qty 2

## 2016-04-21 MED ORDER — METOCLOPRAMIDE HCL 5 MG PO TABS: 5.0000 mg | ORAL_TABLET | Freq: Three times a day (TID) | ORAL | Status: DC | PRN

## 2016-04-21 MED ORDER — FENTANYL CITRATE (PF) 100 MCG/2ML IJ SOLN
INTRAMUSCULAR | Status: DC | PRN
  Administered 2016-04-21 (×4): 50 ug via INTRAVENOUS

## 2016-04-21 MED ORDER — ONDANSETRON HCL 4 MG/2ML IJ SOLN
INTRAMUSCULAR | Status: AC
  Filled 2016-04-21: qty 2

## 2016-04-21 MED ORDER — ROCURONIUM BROMIDE 10 MG/ML (PF) SYRINGE
PREFILLED_SYRINGE | INTRAVENOUS | Status: AC
  Filled 2016-04-21: qty 10

## 2016-04-21 MED ORDER — ARTIFICIAL TEARS OP OINT
TOPICAL_OINTMENT | OPHTHALMIC | Status: AC
Start: 2016-04-21 — End: 2016-04-21
  Filled 2016-04-21: qty 3.5

## 2016-04-21 MED ORDER — PHENYLEPHRINE HCL 10 MG/ML IJ SOLN
INTRAMUSCULAR | Status: DC | PRN
  Administered 2016-04-21: 80 ug via INTRAVENOUS
  Administered 2016-04-21: 40 ug via INTRAVENOUS
  Administered 2016-04-21 (×3): 80 ug via INTRAVENOUS
  Administered 2016-04-21: 40 ug via INTRAVENOUS

## 2016-04-21 MED ORDER — SUGAMMADEX SODIUM 200 MG/2ML IV SOLN
INTRAVENOUS | Status: DC | PRN
  Administered 2016-04-21: 166 mg via INTRAVENOUS

## 2016-04-21 MED ORDER — MIDAZOLAM HCL 2 MG/2ML IJ SOLN
INTRAMUSCULAR | Status: AC
  Filled 2016-04-21: qty 2

## 2016-04-21 MED ORDER — ONDANSETRON HCL 4 MG/2ML IJ SOLN: 4.0000 mg | Freq: Four times a day (QID) | INTRAMUSCULAR | Status: DC | PRN

## 2016-04-21 MED ORDER — DIPHENHYDRAMINE HCL 12.5 MG/5ML PO ELIX: 12.5000 mg | ORAL_SOLUTION | ORAL | Status: DC | PRN

## 2016-04-21 MED ORDER — HYDROMORPHONE HCL 1 MG/ML IJ SOLN
0.2500 mg | INTRAMUSCULAR | Status: DC | PRN
  Administered 2016-04-21 (×2): 0.5 mg via INTRAVENOUS

## 2016-04-21 MED ORDER — PROPOFOL 10 MG/ML IV BOLUS
INTRAVENOUS | Status: AC
Start: 2016-04-21 — End: 2016-04-21
  Filled 2016-04-21: qty 40

## 2016-04-21 MED ORDER — MIDAZOLAM HCL 5 MG/5ML IJ SOLN
INTRAMUSCULAR | Status: DC | PRN
  Administered 2016-04-21 (×2): 1 mg via INTRAVENOUS

## 2016-04-21 MED ORDER — ONDANSETRON HCL 4 MG PO TABS: 4.0000 mg | ORAL_TABLET | Freq: Four times a day (QID) | ORAL | Status: DC | PRN

## 2016-04-21 MED ORDER — MORPHINE SULFATE (PF) 2 MG/ML IV SOLN: 1.0000 mg | INTRAVENOUS | Status: DC | PRN

## 2016-04-21 MED ORDER — PHENYLEPHRINE HCL 10 MG/ML IJ SOLN
INTRAVENOUS | Status: DC | PRN
  Administered 2016-04-21: 30 ug/min via INTRAVENOUS

## 2016-04-21 MED ORDER — LOSARTAN POTASSIUM 50 MG PO TABS
50.0000 mg | ORAL_TABLET | Freq: Every day | ORAL | Status: DC
  Filled 2016-04-21: qty 1

## 2016-04-21 MED ORDER — BISACODYL 5 MG PO TBEC: 5.0000 mg | DELAYED_RELEASE_TABLET | Freq: Every day | ORAL | Status: DC | PRN

## 2016-04-21 MED ORDER — BUPIVACAINE HCL (PF) 0.5 % IJ SOLN
INTRAMUSCULAR | Status: DC | PRN
  Administered 2016-04-21: 30 mL via PERINEURAL

## 2016-04-21 MED ORDER — PROMETHAZINE HCL 25 MG/ML IJ SOLN: 6.2500 mg | INTRAMUSCULAR | Status: DC | PRN

## 2016-04-21 MED ORDER — ASPIRIN EC 325 MG PO TBEC
325.0000 mg | DELAYED_RELEASE_TABLET | Freq: Every day | ORAL | Status: DC
Start: 2016-04-21 — End: 2016-04-22
  Administered 2016-04-21 – 2016-04-22 (×2): 325 mg via ORAL
  Filled 2016-04-21 (×2): qty 1

## 2016-04-21 MED ORDER — SUGAMMADEX SODIUM 200 MG/2ML IV SOLN
INTRAVENOUS | Status: AC
  Filled 2016-04-21: qty 2

## 2016-04-21 MED ORDER — HYDROMORPHONE HCL 2 MG/ML IJ SOLN
INTRAMUSCULAR | Status: AC
  Filled 2016-04-21: qty 1

## 2016-04-21 MED ORDER — EPHEDRINE 5 MG/ML INJ
INTRAVENOUS | Status: AC
  Filled 2016-04-21: qty 10

## 2016-04-21 MED ORDER — ACETAMINOPHEN 325 MG PO TABS: 650.0000 mg | ORAL_TABLET | Freq: Four times a day (QID) | ORAL | Status: DC | PRN

## 2016-04-21 MED ORDER — FENTANYL CITRATE (PF) 100 MCG/2ML IJ SOLN
INTRAMUSCULAR | Status: AC
  Filled 2016-04-21: qty 2

## 2016-04-21 MED ORDER — CEFAZOLIN SODIUM-DEXTROSE 2-4 GM/100ML-% IV SOLN
2.0000 g | Freq: Four times a day (QID) | INTRAVENOUS | Status: AC
  Administered 2016-04-21 – 2016-04-22 (×3): 2 g via INTRAVENOUS
  Filled 2016-04-21 (×3): qty 100

## 2016-04-21 MED ORDER — MENTHOL 3 MG MT LOZG: 1.0000 | LOZENGE | OROMUCOSAL | Status: DC | PRN

## 2016-04-21 SURGICAL SUPPLY — 69 items
AEQUALIS PERFORM GUIDWIRE 2.5MMX220MM ×2 IMPLANT
BIT DRILL 5/64X5 DISP (BIT) ×2 IMPLANT
BLADE SAW SAG 73X25 THK (BLADE) ×1
BLADE SAW SGTL 73X25 THK (BLADE) ×1 IMPLANT
BLADE SURG 15 STRL LF DISP TIS (BLADE) ×1 IMPLANT
BLADE SURG 15 STRL SS (BLADE) ×1
CAP SHOULDER TOTAL 2 ×2 IMPLANT
CEMENT BONE DEPUY (Cement) ×2 IMPLANT
CHLORAPREP W/TINT 26ML (MISCELLANEOUS) ×4 IMPLANT
COVER SURGICAL LIGHT HANDLE (MISCELLANEOUS) ×2 IMPLANT
DRAPE INCISE IOBAN 66X45 STRL (DRAPES) ×2 IMPLANT
DRAPE ORTHO SPLIT 77X108 STRL (DRAPES) ×4
DRAPE SURG 17X23 STRL (DRAPES) ×2 IMPLANT
DRAPE SURG ORHT 6 SPLT 77X108 (DRAPES) ×4 IMPLANT
DRAPE U-SHAPE 47X51 STRL (DRAPES) ×2 IMPLANT
DRSG AQUACEL AG ADV 3.5X10 (GAUZE/BANDAGES/DRESSINGS) ×2 IMPLANT
ELECT BLADE 4.0 EZ CLEAN MEGAD (MISCELLANEOUS)
ELECT REM PT RETURN 9FT ADLT (ELECTROSURGICAL) ×2
ELECTRODE BLDE 4.0 EZ CLN MEGD (MISCELLANEOUS) IMPLANT
ELECTRODE REM PT RTRN 9FT ADLT (ELECTROSURGICAL) ×1 IMPLANT
EVACUATOR 1/8 PVC DRAIN (DRAIN) IMPLANT
GLOVE BIO SURGEON STRL SZ7 (GLOVE) ×2 IMPLANT
GLOVE BIO SURGEON STRL SZ7.5 (GLOVE) ×2 IMPLANT
GLOVE BIOGEL PI IND STRL 7.0 (GLOVE) ×1 IMPLANT
GLOVE BIOGEL PI IND STRL 8 (GLOVE) ×1 IMPLANT
GLOVE BIOGEL PI INDICATOR 7.0 (GLOVE) ×1
GLOVE BIOGEL PI INDICATOR 8 (GLOVE) ×1
GOWN STRL REUS W/ TWL LRG LVL3 (GOWN DISPOSABLE) ×1 IMPLANT
GOWN STRL REUS W/ TWL XL LVL3 (GOWN DISPOSABLE) ×1 IMPLANT
GOWN STRL REUS W/TWL LRG LVL3 (GOWN DISPOSABLE) ×1
GOWN STRL REUS W/TWL XL LVL3 (GOWN DISPOSABLE) ×1
HANDPIECE INTERPULSE COAX TIP (DISPOSABLE) ×1
HEMOSTAT SURGICEL 2X14 (HEMOSTASIS) ×2 IMPLANT
HOOD PEEL AWAY FLYTE STAYCOOL (MISCELLANEOUS) ×4 IMPLANT
KIT BASIN OR (CUSTOM PROCEDURE TRAY) ×2 IMPLANT
KIT ROOM TURNOVER OR (KITS) ×2 IMPLANT
MANIFOLD NEPTUNE II (INSTRUMENTS) ×2 IMPLANT
NEEDLE HYPO 25GX1X1/2 BEV (NEEDLE) IMPLANT
NEEDLE MAYO TROCAR (NEEDLE) ×2 IMPLANT
NS IRRIG 1000ML POUR BTL (IV SOLUTION) ×2 IMPLANT
PACK SHOULDER (CUSTOM PROCEDURE TRAY) ×2 IMPLANT
PAD ARMBOARD 7.5X6 YLW CONV (MISCELLANEOUS) ×4 IMPLANT
RESTRAINT HEAD UNIVERSAL NS (MISCELLANEOUS) ×2 IMPLANT
RETRIEVER SUT HEWSON (MISCELLANEOUS) ×2 IMPLANT
SET HNDPC FAN SPRY TIP SCT (DISPOSABLE) ×1 IMPLANT
SLING ARM IMMOBILIZER LRG (SOFTGOODS) ×4 IMPLANT
SLING ARM IMMOBILIZER MED (SOFTGOODS) IMPLANT
SMARTMIX MINI TOWER (MISCELLANEOUS) ×2
SPONGE LAP 18X18 X RAY DECT (DISPOSABLE) ×2 IMPLANT
SPONGE LAP 4X18 X RAY DECT (DISPOSABLE) IMPLANT
STRIP CLOSURE SKIN 1/2X4 (GAUZE/BANDAGES/DRESSINGS) ×2 IMPLANT
SUCTION FRAZIER HANDLE 10FR (MISCELLANEOUS) ×1
SUCTION TUBE FRAZIER 10FR DISP (MISCELLANEOUS) ×1 IMPLANT
SUPPORT WRAP ARM LG (MISCELLANEOUS) ×2 IMPLANT
SUT BONE WAX W31G (SUTURE) ×2 IMPLANT
SUT ETHIBOND NAB CT1 #1 30IN (SUTURE) ×6 IMPLANT
SUT MNCRL AB 4-0 PS2 18 (SUTURE) ×2 IMPLANT
SUT SILK 2 0 TIES 17X18 (SUTURE)
SUT SILK 2-0 18XBRD TIE BLK (SUTURE) IMPLANT
SUT VIC AB 0 CT1 27 (SUTURE) ×1
SUT VIC AB 0 CT1 27XBRD ANBCTR (SUTURE) ×1 IMPLANT
SUT VIC AB 2-0 CT1 27 (SUTURE) ×1
SUT VIC AB 2-0 CT1 TAPERPNT 27 (SUTURE) ×1 IMPLANT
SYR CONTROL 10ML LL (SYRINGE) IMPLANT
TAPE LABRALWHITE 1.5X36 (TAPE) ×4 IMPLANT
TAPE SUT LABRALTAP WHT/BLK (SUTURE) ×2 IMPLANT
TOWEL OR 17X24 6PK STRL BLUE (TOWEL DISPOSABLE) ×2 IMPLANT
TOWEL OR 17X26 10 PK STRL BLUE (TOWEL DISPOSABLE) ×2 IMPLANT
TOWER SMARTMIX MINI (MISCELLANEOUS) ×1 IMPLANT

## 2016-04-21 NOTE — Progress Notes (Signed)
Danielle PA at bedside-fully updated re O2 sats. dropping and SBP 80's. She is going to update  Dr.  Ave Filterhandler & he will be by to see pt.

## 2016-04-21 NOTE — Op Note (Signed)
Procedure(s): LEFT TOTAL SHOULDER ARTHROPLASTY Procedure Note  Brianna Riley female 60 y.o. 04/21/2016  Procedure(s) and Anesthesia Type:    * LEFT TOTAL SHOULDER ARTHROPLASTY - General  Surgeon(s) and Role:    * Jones BroomJustin Travas Schexnayder, MD - Primary   Indications:  60 y.o. female  With endstage left shoulder arthritis. Pain and dysfunction interfered with quality of life and nonoperative treatment with activity modification, NSAIDS and injections failed.     Surgeon: Brianna Riley,Brianna Riley Brianna Riley   Assistants: Damita Lackanielle Lalibert PA-C [facility](Danielle was present and scrubbed throughout the procedure and was essential in positioning, retraction, exposure, and closure)  Anesthesia: General endotracheal anesthesia    Procedure Detail  LEFT TOTAL SHOULDER ARTHROPLASTY  Findings: Tornier flex anatomic press-fit size to stem with a 46 low offset head, cemented size small Cortiloc glenoid.   A lesser tuberosity osteotomy was performed and repaired at the conclusion of the procedure.  Estimated Blood Loss:  200 mL         Drains: None   Blood Given: none          Specimens: none        Complications:  * No complications entered in OR log *         Disposition: PACU - hemodynamically stable.         Condition: stable    Procedure:   The patient was identified in the preoperative holding area where I personally marked the operative extremity after verifying with the patient and consent. She  was taken to the operating room where She was transferred to the   operative table.  The patient received an interscalene block in   the holding area by the attending anesthesiologist.  General anesthesia was induced   in the operating room without complication.  The patient did receive IV  Ancef prior to the commencement of the procedure.  The patient was   placed in the beach-chair position with the back raised about 30   degrees.  The nonoperative extremity and head and neck were carefully   positioned and padded protecting against neurovascular compromise.  The   left upper extremity was then prepped and draped in the standard sterile   fashion.    The appropriate operative time-out was performed with   Anesthesia, the perioperative staff, as well as myself and we all agreed   that the left side was the correct operative site.  An approximately   10 cm incision was made from the tip of the coracoid to the center point of the   humerus at the level of the axilla.  Dissection was carried down sharply   through subcutaneous tissues and cephalic vein was identified and taken   laterally with the deltoid.  The pectoralis major was taken medially.  The   upper 1 cm of the pectoralis major was released from its attachment on   the humerus.  The clavipectoral fascia was incised just lateral to the   conjoined tendon.  This incision was carried up to but not into the   coracoacromial ligament.  Digital palpation was used to prove   integrity of the axillary nerve which was protected throughout the   procedure.  Musculocutaneous nerve was not palpated in the operative   field.  Conjoined tendon was then retracted gently medially and the   deltoid laterally.  Anterior circumflex humeral vessels were clamped and   coagulated.  The soft tissues overlying the biceps was incised and this   incision was carried across  the transverse humeral ligament to the base   of the coracoid.  The biceps was tenodesed to the soft tissue just above   pectoralis major and the remaining portion of the biceps superiorly was   excised.  An osteotomy was performed at the lesser tuberosity.  Capsule was then   released all the way down to the 6 o'clock position of the humeral head.   The humeral head was then delivered with simultaneous adduction,   extension and external rotation.  All humeral osteophytes were removed   and the anatomic neck of the humerus was marked and cut free hand at   approximately 25  degrees retroversion within about 3 mm of the cuff   reflection posteriorly.  The head size was estimated to be a 46 medium   offset.  At that point, the humeral head was retracted posteriorly with   a Fukuda retractor.  Remaining portion of the capsule was released at the base of the   coracoid.  The remaining biceps anchor and the entire anterior-inferior   labrum was excised.  The posterior labrum was also excised but the   posterior capsule was not released.  The guidepin was placed bicortically with +0 elevated guide.  The reamer was used to ream to concentric bone with punctate bleeding.  This gave an excellent concentric surface.  The center hole was then drilled for an anchor peg glenoid followed by the three peripheral holes and none of the holes   exited the glenoid wall.  I then pulse irrigated these holes and dried   them with Surgicel.  The three peripheral holes were then   pressurized cemented and the anchor peg glenoid was placed and impacted   with an excellent fit.  The glenoid was a small component.  The proximal humerus was then again exposed taking care not to displace the glenoid.    The entry awl was used followed by sounding reamers and then sequentially broached from size 1 to 2. This was then left in place and the calcar planer was used. Trial head was placed with a 46.  With the trial implantation of the component,  there was approximately 50% posterior translation with snap back to the   anatomic position.  It was slightly loose with a 43, but the 46 was improved.  .   The trial was removed and the final implant was prepared on a back table.  The trial was removed and the final implant was prepared on a back table.   3 small holes were drilled on the medial side of the lesser tuberosity osteotomy, through which 2 labral tapes were passed. The implant was then placed through the loop of the 2 labral tapes and impacted with an excellent press-fit. This achieved excellent  anatomic reconstruction of the proximal humerus.  The joint was then copiously irrigated with pulse lavage.  The subscapularis and   lesser tuberosity osteotomy were then repaired using the 2 labral tapes previously passed in a double row fashion with horizontal mattress sutures medially brought over through bone tunnels tied over a bone bridge laterally.   One #1 Ethibond was placed at the rotator interval just above   the lesser tuberosity. Copious irrigation was used. Skin was closed with 2-0 Vicryl sutures in the deep dermal layer and 4-0 Monocryl in a subcuticular  running fashion.  Sterile dressings were then applied including Aquacel.  The patient was placed in a sling and allowed to awaken from general anesthesia  and taken to the recovery room in stable condition.      POSTOPERATIVE PLAN:  Early passive range of motion will be allowed with the goal of 0 degrees external rotation and 90 degrees forward elevation.  No internal rotation at this time.  No active motion of the arm until the lesser tuberosity heals.  The patient will likely be kept in the hospital for 1-2 days and then discharged home.

## 2016-04-21 NOTE — Progress Notes (Signed)
Lunch relief by S. Gregson RN 

## 2016-04-21 NOTE — Anesthesia Procedure Notes (Addendum)
Anesthesia Regional Block:  Interscalene brachial plexus block  Pre-Anesthetic Checklist: ,, timeout performed, Correct Patient, Correct Site, Correct Laterality, Correct Procedure, Correct Position, site marked, Risks and benefits discussed,  Surgical consent,  Pre-op evaluation,  At surgeon's request and post-op pain management  Laterality: Upper and Left  Prep: Betadine, chloraprep       Needles:  Injection technique: Single-shot  Needle Type: Echogenic Stimulator Needle     Needle Length: 9cm 9 cm Needle Gauge: 21 and 21 G  Needle insertion depth: 4 cm   Additional Needles:  Procedures: ultrasound guided (picture in chart) and nerve stimulator Interscalene brachial plexus block  Nerve Stimulator or Paresthesia:  Response: Twitch elicited, 0.5 mA, 0.3 ms,   Additional Responses:   Narrative:  Start time: 04/21/2016 7:00 AM End time: 04/21/2016 7:19 AM Injection made incrementally with aspirations every 5 mL.  Performed by: Personally  Anesthesiologist: Juancarlos Crescenzo  Additional Notes: Block assessed prior to start of surgery

## 2016-04-21 NOTE — H&P (Signed)
Brianna Riley is an 60 y.o. female.   Chief Complaint: L shoulder pain and dysfunction. HPI: L shoulder endstage arthritis.  Pain interferes with sleep and quality of life.  Nonoperative measures of activity modification, medication and injections have failed to provide relief.  Past Medical History:  Diagnosis Date  . Allergic rhinitis, cause unspecified   . Dyspnea   . Fatty liver   . GERD (gastroesophageal reflux disease)   . Hyperlipidemia   . Hypertension, essential, benign   . Insomnia   . Knee pain, bilateral   . Lumbago   . Migraine   . Mild depression ([place])   . Nonspecific abnormal results of liver function study   . Obesity   . Osteoarthritis   . Perimenopausal     Past Surgical History:  Procedure Laterality Date  . CARDIAC CATHETERIZATION  09/2006   [place], Dr. Stanford Breed  . CATARACT EXTRACTION W/ INTRAOCULAR LENS IMPLANT  2002   Right and Left  . DOBUTAMINE STRESS ECHO  08/2008  . LAPAROSCOPY ABDOMEN DIAGNOSTIC      Family History  Problem Relation Age of Onset  . Lung cancer Mother   . Heart attack Father 39  . Drug abuse Brother    Social History:  reports that she quit smoking about 19 years ago. She does not have any smokeless tobacco history on file. She reports that she does not drink alcohol or use drugs.  Allergies:  Allergies  Allergen Reactions  . Duloxetine Other (See Comments)    Irritable   . Hctz [Hydrochlorothiazide] Other (See Comments)    Caused inc in BUN/CR    Medications Prior to Admission  Medication Sig Dispense Refill  . aspirin 325 MG tablet Take 650-1,000 mg by mouth See admin instructions. Take 650 mg by mouth in the morning and take 1000 mg by mouth in the evening    . atorvastatin (LIPITOR) 80 MG tablet Take 1 tablet (80 mg total) by mouth daily. 90 tablet 3  . fish oil-omega-3 fatty acids 1000 MG capsule Take 1 capsule by mouth daily.      . Flaxseed, Linseed, (FLAX SEED OIL) 1000 MG CAPS Take 1,000 mg by mouth daily.      . Garcinia Cambogia-Chromium 500-200 MG-MCG TABS Take 1 tablet by mouth daily.     . Garlic Oil 742 MG CAPS Take 500 mg by mouth daily.     Marland Kitchen losartan (COZAAR) 50 MG tablet Take 1 tablet (50 mg total) by mouth daily. 90 tablet 1  . MELATONIN PO Take 1 tablet by mouth daily.     . milk thistle 175 MG tablet Take 175 mg by mouth daily.    . Multiple Vitamins-Iron (MULTIVITAMIN/IRON) TABS Take 1 tablet by mouth daily.      . niacin (SLO-NIACIN) 500 MG tablet Take 500 mg by mouth at bedtime.    . pantoprazole (PROTONIX) 40 MG tablet Take 1 tablet (40 mg total) by mouth daily. 30 tablet 5  . traMADol (ULTRAM) 50 MG tablet take 1 to 2 tablets by mouth every 8 hours (MAX 6 TABLETS PER DAY) (Patient taking differently: Take 25-50 mg by mouth 5 times daily as needed) 90 tablet 0  . pregabalin (LYRICA) 50 MG capsule Take 1 capsule (50 mg total) by mouth 3 (three) times daily. 21 capsule 0    Results for orders placed or performed during the hospital encounter of 04/19/16 (from the past 48 hour(s))  Urinalysis, Routine w reflex microscopic (not at [facility])  Status: None   Collection Time: 04/19/16  1:44 PM  Result Value Ref Range   Color, Urine YELLOW YELLOW   APPearance CLEAR CLEAR   Specific Gravity, Urine 1.007 1.005 - 1.030   pH 6.0 5.0 - 8.0   Glucose, UA NEGATIVE NEGATIVE mg/dL   Hgb urine dipstick NEGATIVE NEGATIVE   Bilirubin Urine NEGATIVE NEGATIVE   Ketones, ur NEGATIVE NEGATIVE mg/dL   Protein, ur NEGATIVE NEGATIVE mg/dL   Nitrite NEGATIVE NEGATIVE   Leukocytes, UA NEGATIVE NEGATIVE    Comment: MICROSCOPIC NOT DONE ON URINES WITH NEGATIVE PROTEIN, BLOOD, LEUKOCYTES, NITRITE, OR GLUCOSE <1000 mg/dL.  Surgical pcr screen     Status: None   Collection Time: 04/19/16  1:44 PM  Result Value Ref Range   MRSA, PCR NEGATIVE NEGATIVE   Staphylococcus aureus NEGATIVE NEGATIVE    Comment:        The Xpert SA Assay (FDA approved for NASAL specimens in patients over 29 years of age), is one  component of a comprehensive surveillance program.  Test performance has been validated by [facility] for patients greater than or equal to 19 year old. It is not intended to diagnose infection nor to guide or monitor treatment.   APTT     Status: Abnormal   Collection Time: 04/19/16  1:45 PM  Result Value Ref Range   aPTT 23 (L) 24 - 36 seconds  CBC WITH DIFFERENTIAL     Status: None   Collection Time: 04/19/16  1:45 PM  Result Value Ref Range   WBC 8.1 4.0 - 10.5 K/uL   RBC 4.32 3.87 - 5.11 MIL/uL   Hemoglobin 12.8 12.0 - 15.0 g/dL   HCT 39.8 36.0 - 46.0 %   MCV 92.1 78.0 - 100.0 fL   MCH 29.6 26.0 - 34.0 pg   MCHC 32.2 30.0 - 36.0 g/dL   RDW 15.0 11.5 - 15.5 %   Platelets 222 150 - 400 K/uL   Neutrophils Relative % 60 %   Neutro Abs 4.8 1.7 - 7.7 K/uL   Lymphocytes Relative 31 %   Lymphs Abs 2.5 0.7 - 4.0 K/uL   Monocytes Relative 7 %   Monocytes Absolute 0.6 0.1 - 1.0 K/uL   Eosinophils Relative 3 %   Eosinophils Absolute 0.2 0.0 - 0.7 K/uL   Basophils Relative 1 %   Basophils Absolute 0.0 0.0 - 0.1 K/uL  Comprehensive metabolic panel     Status: Abnormal   Collection Time: 04/19/16  1:45 PM  Result Value Ref Range   Sodium 143 135 - 145 mmol/L   Potassium 4.1 3.5 - 5.1 mmol/L   Chloride 112 (H) 101 - 111 mmol/L   CO2 24 22 - 32 mmol/L   Glucose, Bld 99 65 - 99 mg/dL   BUN 18 6 - 20 mg/dL   Creatinine, Ser 1.11 (H) 0.44 - 1.00 mg/dL   Calcium 9.7 8.9 - 10.3 mg/dL   Total Protein 6.0 (L) 6.5 - 8.1 g/dL   Albumin 3.4 (L) 3.5 - 5.0 g/dL   AST 35 15 - 41 U/L   ALT 31 14 - 54 U/L   Alkaline Phosphatase 102 38 - 126 U/L   Total Bilirubin 0.6 0.3 - 1.2 mg/dL   GFR calc non Af Amer 53 (L) >60 mL/min   GFR calc Af Amer >60 >60 mL/min    Comment: (NOTE) The eGFR has been calculated using the CKD EPI equation. This calculation has not been validated in all clinical situations.  eGFR's persistently <60 mL/min signify possible Chronic Kidney Disease.    Anion gap 7  5 - 15  Protime-INR     Status: None   Collection Time: 04/19/16  1:45 PM  Result Value Ref Range   Prothrombin Time 12.5 11.4 - 15.2 seconds   INR 0.93   Type and screen Order type and screen if day of surgery is less than 15 days from draw of preadmission visit or order morning of surgery if day of surgery is greater than 6 days from preadmission visit.     Status: None   Collection Time: 04/19/16  1:50 PM  Result Value Ref Range   ABO/RH(D) O POS    Antibody Screen NEG    Sample Expiration 04/22/2016   ABO/Rh     Status: None   Collection Time: 04/19/16  1:50 PM  Result Value Ref Range   ABO/RH(D) O POS    Dg Chest 2 View  Result Date: 04/19/2016 CLINICAL DATA:  Pre-op for left total shoulder arthroplasty on 04/21/2016.No chest complaints.Hx of HTN, GERD. EXAM: CHEST  2 VIEW COMPARISON:  12/04/2012 FINDINGS: Cardiac silhouette is normal in size. No mediastinal or hilar masses. No evidence of adenopathy. Streaky opacity is noted at the left lung base consistent with scarring or atelectasis. Lungs are otherwise clear. No pleural effusion or pneumothorax. An calcification projects below the left coracoid process which may reflect an intra-articular body within the subcoracoid recess. IMPRESSION: No active cardiopulmonary disease. Electronically Signed   By: Lajean Manes M.D.   On: 04/19/2016 15:07    Review of Systems  All other systems reviewed and are negative.   Blood pressure (!) 142/73, pulse 92, temperature 98.9 F (37.2 C), temperature source Oral, resp. rate 18, weight 83 kg (183 lb), SpO2 97 %. Physical Exam  Constitutional: She is oriented to person, place, and time. She appears well-developed and well-nourished.  HENT:  Head: Atraumatic.  Eyes: EOM are normal.  Cardiovascular: Intact distal pulses.   Respiratory: Effort normal.  Musculoskeletal:  L shoulder pain with limited ROM. NVID  Neurological: She is alert and oriented to person, place, and time.  Skin: Skin  is warm and dry.  Psychiatric: She has a normal mood and affect.     Assessment/Plan L shoulder endstage arthritis.  Pain interferes with sleep and quality of life.  Nonoperative measures of activity modification, medication and injections have failed to provide relief. Plan L TSA Risks / benefits of surgery discussed Consent on chart  NPO for OR Preop antibiotics   Nita Sells, MD 04/21/2016, 6:58 AM

## 2016-04-21 NOTE — Progress Notes (Signed)
Pt still desats to 47-60's when she is asleep. SBP 78-80's. She is easily arousable and sat comes right back up to 98-100. She pulls about 1000cc on IS, strong dry cough. Dr Jacklynn BueMassagee fully updated, no new orders. Will update Dr Ave Filterhandler (he is in OR)

## 2016-04-21 NOTE — Transfer of Care (Signed)
  Immediate Anesthesia Transfer of Care Note  Patient: Brianna Riley  Procedure(s) Performed: Procedure(s) with comments: LEFT TOTAL SHOULDER ARTHROPLASTY (Left) - LEFT TOTAL SHOULDER ARTHROPLASTY  Patient Location: PACU  Anesthesia Type:General and Regional  Level of Consciousness: awake, alert , oriented and sedated  Airway & Oxygen Therapy: Patient Spontanous Breathing and Patient connected to face mask oxygen  Post-op Assessment: Report given to RN, Post -op Vital signs reviewed and stable and Patient moving all extremities  Post vital signs: Reviewed and stable  Last Vitals:  Vitals:   04/21/16 0647  BP: (!) 142/73  Pulse: 92  Resp: 18  Temp: 37.2 C    Last Pain:  Vitals:   04/21/16 0647  TempSrc: Oral  PainSc:          Complications: No apparent anesthesia complications

## 2016-04-21 NOTE — Progress Notes (Signed)
Dr Ave Filterchandler at bedside pt up in recliner up to br voided qs in toilet pt very alert o2 sats on ra=94-100 bp 105/67 order obtained to send pt to regular floor with continous o2 sat monitoring

## 2016-04-21 NOTE — Anesthesia Preprocedure Evaluation (Addendum)
Anesthesia Evaluation  Patient identified by MRN, date of birth, ID band Patient awake    Reviewed: Allergy & Precautions, NPO status , Patient's Chart, lab work & pertinent test results  Airway Mallampati: III  TM Distance: >3 FB Neck ROM: Full    Dental  (+) Teeth Intact, Dental Advisory Given   Pulmonary shortness of breath, former smoker,    breath sounds clear to auscultation       Cardiovascular hypertension,  Rhythm:Regular Rate:Normal     Neuro/Psych  Neuromuscular disease    GI/Hepatic GERD  ,  Endo/Other    Renal/GU Renal disease     Musculoskeletal  (+) Arthritis ,   Abdominal (+) + obese,   Peds  Hematology   Anesthesia Other Findings   Reproductive/Obstetrics                            Anesthesia Physical Anesthesia Plan  ASA: II  Anesthesia Plan: General   Post-op Pain Management: GA combined w/ Regional for post-op pain   Induction:   Airway Management Planned: Oral ETT  Additional Equipment:   Intra-op Plan:   Post-operative Plan: Extubation in OR  Informed Consent: I have reviewed the patients History and Physical, chart, labs and discussed the procedure including the risks, benefits and alternatives for the proposed anesthesia with the patient or authorized representative who has indicated his/her understanding and acceptance.   Dental advisory given  Plan Discussed with:   Anesthesia Plan Comments:        Anesthesia Quick Evaluation

## 2016-04-21 NOTE — Progress Notes (Signed)
Dr Jacklynn BueMassagee here to see pt-pt will go to 5N with O2 sat monitor. She is up in recliner-looks good, drinking coffee.

## 2016-04-21 NOTE — Discharge Instructions (Signed)

## 2016-04-21 NOTE — Progress Notes (Signed)
Pt sleeps when left alone, O2 sats drop to 60's on 2 L [place]. She is easily arousable, neuro. appropriate, strong cough and O2 sat comes back up to 98-100%. SBP in 80-90's, NSR 90's. Dr Jacklynn BueMassagee fully updated, no new orders, will continue to monitor closely.

## 2016-04-21 NOTE — Anesthesia Procedure Notes (Signed)
Procedure Name: Intubation Date/Time: 04/21/2016 8:02 AM Performed by: Fransisca KaufmannMEYER, Monnie Gudgel E Pre-anesthesia Checklist: Patient identified, Emergency Drugs available, Suction available and Patient being monitored Patient Re-evaluated:Patient Re-evaluated prior to inductionOxygen Delivery Method: Circle System Utilized Preoxygenation: Pre-oxygenation with 100% oxygen Intubation Type: IV induction Ventilation: Mask ventilation without difficulty Laryngoscope Size: 2 Grade View: Grade I Tube type: Oral Tube size: 7.5 mm Number of attempts: 1 Airway Equipment and Method: Stylet Placement Confirmation: ETT inserted through vocal cords under direct vision,  positive ETCO2 and breath sounds checked- equal and bilateral Secured at: 22 cm Tube secured with: Tape Dental Injury: Teeth and Oropharynx as per pre-operative assessment

## 2016-04-21 NOTE — Anesthesia Postprocedure Evaluation (Signed)
Anesthesia Post Note  Patient: Brianna Riley  Procedure(s) Performed: Procedure(s) (LRB): LEFT TOTAL SHOULDER ARTHROPLASTY (Left)  Patient location during evaluation: PACU Anesthesia Type: General Level of consciousness: awake and sedated Pain management: pain level controlled Vital Signs Assessment: post-procedure vital signs reviewed and stable Respiratory status: spontaneous breathing, nonlabored ventilation, respiratory function stable and patient connected to nasal cannula oxygen Cardiovascular status: blood pressure returned to baseline and stable Postop Assessment: no signs of nausea or vomiting Anesthetic complications: no    Last Vitals:  Vitals:   04/21/16 1115 04/21/16 1127  BP:  (!) 89/53  Pulse:    Resp: 20 16  Temp:      Last Pain:  Vitals:   04/21/16 1115  TempSrc:   PainSc: 2                  Aashi Derrington,JAMES TERRILL

## 2016-04-22 ENCOUNTER — Encounter (HOSPITAL_COMMUNITY): Payer: Self-pay | Admitting: Orthopedic Surgery

## 2016-04-22 LAB — BASIC METABOLIC PANEL
ANION GAP: 9 (ref 5–15)
BUN: 11 mg/dL (ref 6–20)
CALCIUM: 8.9 mg/dL (ref 8.9–10.3)
CO2: 25 mmol/L (ref 22–32)
Chloride: 109 mmol/L (ref 101–111)
Creatinine, Ser: 1.1 mg/dL — ABNORMAL HIGH (ref 0.44–1.00)
GFR calc Af Amer: >60 mL/min (ref >60)
GFR, EST NON AFRICAN AMERICAN: 54 mL/min — AB (ref >60)
GLUCOSE: 122 mg/dL — AB (ref 65–99)
POTASSIUM: 4.4 mmol/L (ref 3.5–5.1)
SODIUM: 143 mmol/L (ref 135–145)

## 2016-04-22 LAB — CBC
HCT: 39.1 % (ref 36.0–46.0)
Hemoglobin: 12.2 g/dL (ref 12.0–15.0)
MCH: 29.2 pg (ref 26.0–34.0)
MCHC: 31.2 g/dL (ref 30.0–36.0)
MCV: 93.5 fL (ref 78.0–100.0)
PLATELETS: 202 10*3/uL (ref 150–400)
RBC: 4.18 MIL/uL (ref 3.87–5.11)
RDW: 15.4 % (ref 11.5–15.5)
WBC: 9.7 10*3/uL (ref 4.0–10.5)

## 2016-04-22 MED ORDER — DOCUSATE SODIUM 100 MG PO CAPS: 100.0000 mg | ORAL_CAPSULE | Freq: Three times a day (TID) | ORAL | 0 refills | Status: DC | PRN

## 2016-04-22 MED ORDER — OXYCODONE-ACETAMINOPHEN 5-325 MG PO TABS: 1.0000 | ORAL_TABLET | ORAL | 0 refills | Status: DC | PRN

## 2016-04-22 NOTE — Progress Notes (Signed)
Pt was educated about wearing her sling on her arm but she refused. NT and RN educated pt but patient stated that it irritated her shoulder and underarm. NT tried a few times through the night to encourage patient to wear sling but she refused. Day RN notified.

## 2016-04-22 NOTE — Care Management (Signed)
Patient has no Home Health needs identified.  

## 2016-04-22 NOTE — Evaluation (Addendum)
Occupational Therapy Evaluation Patient Details Name: Brianna Riley MRN: [id number] DOB: 17-Apr-1956 Today's Date: 04/22/2016    History of Present Illness   Pt is a 60 y.o. female s/p L total shoulder arthroplasty. Pt has a past medical history significant for, dyspnea, GERD, hyperlipidemia, hypertension, insomnia, B knee pain, lumbago, migraine, mild depression, obesity, and osteoarthritis.   Clinical Impression   PTA, pt required assistance with dressing and bathing reporting that she is able to don/doff pants but husband put her shirt on for her and assisted with bathing. Pt currently requires mod assist for UB ADL while adhering to L shoulder precautions and min assist for LB ADL as well as min guard assist for safety with functional ambulation. Educated pt on passive protocol exercises for L shoulder as detailed below. Pt requires increased verbal cues for safe performance of these exercises. Reinforced necessity of passive motion only at this time to protect L total shoulder arthroplasty. Pt verbalizes understanding but was inconsistent with follow-through requiring further education. Pt with O2 saturation ranging from 94-96% on 3L O2 at rest and sitting. Able to maintain O2 saturation on room air 92-95% while seated. Upon standing for pendulum education, pt with O2 desaturation to 70-76 on room air and not improving with pursed lip breathing techniques and education. Reapplied 3L O2 and saturation returned to 95%. RN aware and notified MD. Pt would benefit from continued OT services while admitted to improve independence with ADL and reinforce pt/family education. OT will continue to follow acutely.      Follow Up Recommendations  No OT follow up;Supervision/Assistance - 24 hour;Other (comment) (Follow-up per MD)    Equipment Recommendations  None recommended by OT       Precautions / Restrictions Precautions Precautions: Shoulder Type of Shoulder Precautions: Passive protocol L  shoulder: sling at all times (remove for ADL and exercises), PROM forward flexion 0-90, PROM external rotation to neutral only, pendulums okay, elbow/wrist/hand AROM, no AROM and NWB L UE. Shoulder Interventions: Shoulder sling/immobilizer;At all times;Off for dressing/bathing/exercises (Left) Precaution Booklet Issued: Yes (comment) Precaution Comments: Reviewed handout, dressing/bathing techniques, and PROM exercises as detailed above. Required Braces or Orthoses: Sling (L shoulder sling/immobilizer) Restrictions Weight Bearing Restrictions: Yes LUE Weight Bearing: Non weight bearing      Mobility Bed Mobility Overal bed mobility: Needs Assistance Bed Mobility: Rolling;Sidelying to Sit;Sit to Sidelying Rolling: Min guard Sidelying to sit: Min guard     Sit to sidelying: Min guard General bed mobility comments: Min guard for safety and consistent VC's to increase safety with technique. Pt tending to change positions rapidly and required VC's for safety to protect L shoulder.  Transfers Overall transfer level: Needs assistance   Transfers: Sit to/from Stand Sit to Stand: Min guard              Balance Overall balance assessment: Needs assistance Sitting-balance support: No upper extremity supported;Feet supported Sitting balance-Leahy Scale: Good     Standing balance support: During functional activity;No upper extremity supported Standing balance-Leahy Scale: Fair Standing balance comment: Required min guard assist for dynamic standing balance.                            ADL Overall ADL's : Needs assistance/impaired     Grooming: Minimal assistance;Sitting   Upper Body Bathing: Minimal assitance;Cueing for UE precautions   Lower Body Bathing: Minimal assistance;Sit to/from stand   Upper Body Dressing : Moderate assistance;With caregiver independent assisting;Cueing for UE precautions;Sitting  Lower Body Dressing: Minimal assistance;Sit to/from  stand   Toilet Transfer: Minimal assistance;Ambulation;BSC   Toileting- ArchitectClothing Manipulation and Hygiene: Min guard;Sit to/from stand       Functional mobility during ADLs: Min guard General ADL Comments: Pt and husband educated on dressing/bathing techniques while adhering to L UE precautions. Pt with decreased receptivity to instruction and reinforced importance of maintaining precautions carefully in order to allow proper healing post L TSA.     Vision Vision Assessment?: No apparent visual deficits          Pertinent Vitals/Pain Pain Assessment: Faces Faces Pain Scale: Hurts even more Pain Location: L shoulder Pain Descriptors / Indicators: Aching;Operative site guarding;Grimacing;Sore Pain Intervention(s): Limited activity within patient's tolerance;Monitored during session;Repositioned;Ice applied     Hand Dominance Right   Extremity/Trunk Assessment Upper Extremity Assessment Upper Extremity Assessment: LUE deficits/detail LUE Deficits / Details: Unable to fully assess due to immobilization and pain. Decreased strength and ROM as expected post-operatively.  LUE: Unable to fully assess due to pain;Unable to fully assess due to immobilization   Lower Extremity Assessment Lower Extremity Assessment: Overall WFL for tasks assessed       Communication Communication Communication: No difficulties   Cognition Arousal/Alertness: Awake/alert Behavior During Therapy: WFL for tasks assessed/performed Overall Cognitive Status: Within Functional Limits for tasks assessed       Memory: Decreased recall of precautions                Exercises Exercises: Shoulder     Shoulder Instructions Shoulder Instructions Donning/doffing shirt without moving shoulder: Moderate assistance;Caregiver independent with task Method for sponge bathing under operated UE: Minimal assistance;Caregiver independent with task Donning/doffing sling/immobilizer: Moderate assistance Correct  positioning of sling/immobilizer: Moderate assistance Pendulum exercises (written home exercise program): Min-guard ROM for elbow, wrist and digits of operated UE: Supervision/safety Sling wearing schedule (on at all times/off for ADL's): Supervision/safety Proper positioning of operated UE when showering: Moderate assistance Positioning of UE while sleeping: Moderate assistance    Home Living Family/patient expects to be discharged to:: Private residence Living Arrangements: Spouse/significant other Available Help at Discharge: Family;Available 24 hours/day Type of Home: House Home Access: Stairs to enter Entergy CorporationEntrance Stairs-Number of Steps: 14 Entrance Stairs-Rails: Can reach both Home Layout: Multi-level Alternate Level Stairs-Number of Steps: 14 Alternate Level Stairs-Rails: Can reach both Bathroom Shower/Tub: Producer, television/film/videoWalk-in shower   Bathroom Toilet: Handicapped height     Home Equipment: Grab bars - tub/shower;Shower seat - built in          Prior Functioning/Environment Level of Independence: Needs assistance    ADL's / Homemaking Assistance Needed: Pt's husband assisted to don/doff shirt and bathe UB. Pt able to don/doff pants.            OT Problem List: Decreased strength;Decreased range of motion;Decreased activity tolerance;Impaired balance (sitting and/or standing);Decreased safety awareness;Decreased knowledge of use of DME or AE;Decreased knowledge of precautions;Pain   OT Treatment/Interventions: Self-care/ADL training;Therapeutic exercise;Therapeutic activities;Patient/family education;Balance training    OT Goals(Current goals can be found in the care plan section) Acute Rehab OT Goals Patient Stated Goal: to go home today OT Goal Formulation: With patient/family Time For Goal Achievement: 05/06/16 Potential to Achieve Goals: Good ADL Goals Pt Will Perform Upper Body Bathing: with min assist;with caregiver independent in assisting;sitting Pt Will Perform Upper  Body Dressing: with min assist;with caregiver independent in assisting;sitting Pt Will Transfer to Toilet: with supervision;ambulating;regular height toilet Pt Will Perform Toileting - Clothing Manipulation and hygiene: with supervision;sit to/from stand Pt Will Perform  Tub/Shower Transfer: with supervision;Shower transfer;shower seat;rolling walker;ambulating Pt/caregiver will Perform Home Exercise Program: Left upper extremity;Increased ROM;With written HEP provided (shoulder passive protocol)  OT Frequency: Min 2X/week    End of Session Equipment Utilized During Treatment: Gait belt;Oxygen (3L O2) Nurse Communication: Other (comment);Mobility status (O2 issues)  Activity Tolerance: Treatment limited secondary to medical complications (Comment) (O2 desat to 70 with standing on room air, reapplied 3LO2) Patient left: in bed;with call bell/phone within reach;with bed alarm set;with family/visitor present;with nursing/sitter in room   Time: 1610-9604 OT Time Calculation (min): 36 min Charges:  OT General Charges $OT Visit: 1 Procedure OT Evaluation $OT Eval Moderate Complexity: 1 Procedure OT Treatments $Self Care/Home Management : 8-22 mins  Doristine Section, OTR/L [phone] 04/22/2016, 9:01 AM

## 2016-04-22 NOTE — Progress Notes (Signed)
   PATIENT ID: Brianna Riley   1 Day Post-Op Procedure(s) (LRB): LEFT TOTAL SHOULDER ARTHROPLASTY (Left)  Subjective: Doing well. Some pain but controlled with oral percocet. Looks like she is refusing to wear her sling.   Objective:  Vitals:   04/22/16 0002 04/22/16 0441  BP: 127/63 113/72  Pulse: (!) 102 (!) 112  Resp: 18 16  Temp: 98.8 F (37.1 C) 98.2 F (36.8 C)     L UE dressing c/d/i Wiggles fingers, distally NVI  Labs:   Recent Labs  04/19/16 1345 04/22/16 0630  HGB 12.8 12.2   Recent Labs  04/19/16 1345 04/22/16 0630  WBC 8.1 9.7  RBC 4.32 4.18  HCT 39.8 39.1  PLT 222 202   Recent Labs  04/19/16 1345 04/22/16 0630  NA 143 143  K 4.1 4.4  CL 112* 109  CO2 24 25  BUN 18 11  CREATININE 1.11* 1.10*  GLUCOSE 99 122*  CALCIUM 9.7 8.9    Assessment and Plan: 1 day s/p left TSA Continue oral percocet, we'll send her home on this for pain control PT- PROM FF limited to 90, ER limited to 0 only, hand wrist elbow Continue education on sling use, she must weat sling at all times except for PT and ADLs D/c home today Fu with Dr. Ave Filterhandler in 2 weeks  VTE proph: ASA 325mg  daily, SCDs

## 2016-04-22 NOTE — Progress Notes (Signed)
Reviewed discharge instructions/medication with patient and patient's husband.  Answered all of their questions.  Patients Oxygen Saturation has been staying in the 90's on room air.  She is ready for discharge.

## 2016-04-22 NOTE — Progress Notes (Signed)
Occupational Therapy Treatment Patient Details Name: Brianna Riley MRN: [id number] DOB: 1955-08-16 Today's Date: 04/22/2016    History of present illness Pt is a 60 y.o. female s/p L total shoulder arthroplasty. Pt has a past medical history significant for, dyspnea, GERD, hyperlipidemia, hypertension, insomnia, B knee pain, lumbago, migraine, mild depression, obesity, and osteoarthritis.   OT comments  Pt making progress towards goals, but still needing moderate verbal cues for NWB status and precautions. This session focused on safe transfers, self care after toileting, and reinforcing sling education. Pt demonstrating impulsive behaviors, and required 1 HHA during ambulation to and from bathroom. Would benefit from continued education on exercises and precautions. OT will to continue to follow acutely.  Follow Up Recommendations  Supervision/Assistance - 24 hour;Other (comment) (progress rehab of shoulder as order by MD at follow up)    Equipment Recommendations  None recommended by OT    Recommendations for Other Services      Precautions / Restrictions Precautions Precautions: Shoulder Type of Shoulder Precautions: Passive protocol L shoulder: sling at all times (remove for ADL and exercises), PROM forward flexion 0-90, PROM external rotation to neutral only, pendulums okay, elbow/wrist/hand AROM, no AROM and NWB L UE. Shoulder Interventions: Shoulder sling/immobilizer;At all times;Off for dressing/bathing/exercises (left) Precaution Booklet Issued: Yes (comment) Precaution Comments: Reviewed handout, dressing/bathing techniques, and PROM exercises as detailed above. Required Braces or Orthoses: Sling Restrictions Weight Bearing Restrictions: Yes LUE Weight Bearing: Non weight bearing       Mobility Bed Mobility Overal bed mobility: Needs Assistance Bed Mobility: Rolling;Sidelying to Sit Rolling: Min guard Sidelying to sit: Min guard     Sit to sidelying: Min  guard General bed mobility comments: Min guard for safety and consistent VC's to increase safety with technique. Pt tending to change positions rapidly and required VC's for safety to protect L shoulder.  Transfers Overall transfer level: Needs assistance   Transfers: Sit to/from Stand Sit to Stand: Min guard         General transfer comment: Pt moves quickly, min guard for safety    Balance Overall balance assessment: Needs assistance Sitting-balance support: No upper extremity supported;Feet supported Sitting balance-Leahy Scale: Good Sitting balance - Comments: sitting EOB with no back support   Standing balance support: Single extremity supported;No upper extremity supported;During functional activity Standing balance-Leahy Scale: Fair Standing balance comment: Required min guard assist for dynamic standing balance.                   ADL Overall ADL's : Needs assistance/impaired     Grooming: Min guard;Wash/dry hands;Standing Grooming Details (indicate cue type and reason): washed hand after toileting at sink level Upper Body Bathing: Minimal assitance;Cueing for UE precautions   Lower Body Bathing: Minimal assistance;Sit to/from stand   Upper Body Dressing : Moderate assistance;With caregiver independent assisting;Cueing for UE precautions;Sitting   Lower Body Dressing: Minimal assistance;Sit to/from stand   Toilet Transfer: Minimal assistance;Comfort height toilet;BSC (BSC over toilet in bathroom)   Toileting- Clothing Manipulation and Hygiene: Min guard;Sit to/from stand;Cueing for safety (verbal cues to not use LUE)       Functional mobility during ADLs: Minimal assistance (1 HHA -Pt stated "Maybe I should use my cane at home") General ADL Comments: Reinforced NWB through LUE, Pt displaying impulsivity throughout session      Vision                     Perception     Praxis  Cognition   Behavior During Therapy: WFL for tasks  assessed/performed Overall Cognitive Status: Within Functional Limits for tasks assessed       Memory: Decreased recall of precautions (Pt required mulitple cues for NWB through LUE)               Extremity/Trunk Assessment  Upper Extremity Assessment Upper Extremity Assessment: LUE deficits/detail LUE Deficits / Details: Unable to fully assess due to immobilization and pain. Decreased strength and ROM as expected post-operatively.  LUE: Unable to fully assess due to pain;Unable to fully assess due to immobilization   Lower Extremity Assessment Lower Extremity Assessment: Overall WFL for tasks assessed        Exercises    Shoulder Instructions Shoulder Instructions Method for sponge bathing under operated UE: Minimal assistance;Caregiver independent with task Donning/doffing sling/immobilizer: Moderate assistance Correct positioning of sling/immobilizer: Moderate assistance Sling wearing schedule (on at all times/off for ADL's): Supervision/safety Proper positioning of operated UE when showering: Moderate assistance     General Comments      Pertinent Vitals/ Pain       Pain Assessment: Faces Faces Pain Scale: Hurts even more Pain Location: L shoulder Pain Descriptors / Indicators: Grimacing;Operative site guarding;Sore Pain Intervention(s): Limited activity within patient's tolerance;Monitored during session;Repositioned;Ice applied  Home Living Family/patient expects to be discharged to:: Private residence Living Arrangements: Spouse/significant other Available Help at Discharge: Family;Available 24 hours/day Type of Home: House Home Access: Stairs to enter Enter[employer] CorporationEntrance Stairs-Number of Steps: 14 Entrance Stairs-Rails: Can reach both Home Layout: Multi-level Alternate Level Stairs-Number of Steps: 14 Alternate Level Stairs-Rails: Can reach both Bathroom Shower/Tub: Producer, television/film/videoWalk-in shower   Bathroom Toilet: Handicapped height     Home Equipment: Grab bars -  tub/shower;Shower seat - built in          Prior Functioning/Environment Level of Independence: Needs assistance    ADL's / Homemaking Assistance Needed: Pt's husband assisted to don/doff shirt and bathe UB. Pt able to don/doff pants.       Frequency  Min 2X/week        Progress Toward Goals  OT Goals(current goals can now be found in the care plan section)  Progress towards OT goals: Progressing toward goals  Acute Rehab OT Goals Patient Stated Goal: to go home today OT Goal Formulation: With patient/family Time For Goal Achievement: 05/06/16 Potential to Achieve Goals: Good ADL Goals Pt Will Perform Upper Body Bathing: with min assist;with caregiver independent in assisting;sitting Pt Will Perform Upper Body Dressing: with min assist;with caregiver independent in assisting;sitting Pt Will Transfer to Toilet: with supervision;ambulating;regular height toilet Pt Will Perform Toileting - Clothing Manipulation and hygiene: with supervision;sit to/from stand Pt Will Perform Tub/Shower Transfer: with supervision;Shower transfer;shower seat;rolling walker;ambulating Pt/caregiver will Perform Home Exercise Program: Left upper extremity;Increased ROM;With written HEP provided (shoulder passive protocol)  Plan Discharge plan remains appropriate    Co-evaluation                 End of Session Equipment Utilized During Treatment:  (none due to urgency - O2 reapplied ASAP)   Activity Tolerance Treatment limited secondary to medical complications (Comment) (decreased O2 levels, reapplied ASAP)   Patient Left in chair;with call bell/phone within reach;with family/visitor present   Nurse Communication Mobility status (IV pole beeping)        Time: 4098-1191: 0927-0951 OT Time Calculation (min): 24 min  Charges: OT General Charges $OT Visit: 1 Procedure OT Evaluation $OT Eval Moderate Complexity: 1 Procedure OT Treatments $Self Care/Home Management : 23-37 mins  Brianna Bio  Annice Riley 04/22/2016, 11:06 AM  Sherryl Manges OTR/L [phone]

## 2016-04-22 NOTE — Progress Notes (Signed)
Occupational Therapy Treatment Patient Details Name: Brianna Riley MRN: [id number] DOB: [birth year] Today's Date: 04/22/2016    History of present illness Pt is a 60 y.o. female s/p L total shoulder arthroplasty. Pt has a past medical history significant for, dyspnea, GERD, hyperlipidemia, hypertension, insomnia, B knee pain, lumbago, migraine, mild depression, obesity, and osteoarthritis.   OT comments  Pt progressing toward goals but continues to require verbal cues to adhere to NWB status and shoulder precautions. Addressed HEP education and pt able to participate with improved independence but continues to require min assist with standing exercises and functional mobility. Additionally, pt requires mod assist for UB dressing and donning/doffing sling. Pt will have support from husband at home. He was asleep during this session but demonstrated ability to assist last session. Pt would benefit from continued OT services to reinforce education concerning exercises and precautions to improve safety post-acute D/C. OT will continue to follow acutely.   Follow Up Recommendations  Supervision/Assistance - 24 hour;Other (comment)    Equipment Recommendations  None recommended by OT    Recommendations for Other Services      Precautions / Restrictions Precautions Precautions: Shoulder Type of Shoulder Precautions: Passive protocol L shoulder: sling at all times (remove for ADL and exercises), PROM forward flexion 0-90, PROM external rotation to neutral only, pendulums okay, elbow/wrist/hand AROM, no AROM and NWB L UE. Shoulder Interventions: Shoulder sling/immobilizer;At all times;Off for dressing/bathing/exercises Precaution Booklet Issued: Yes (comment) Precaution Comments: Reviewed handout and exercises Required Braces or Orthoses: Sling Restrictions Weight Bearing Restrictions: Yes LUE Weight Bearing: Non weight bearing       Mobility Bed Mobility Overal bed mobility: Needs  Assistance Bed Mobility: Rolling;Sidelying to Sit Rolling: Min guard Sidelying to sit: Min guard       General bed mobility comments: Min guard for safety and consistent VC's to increase safety with technique. Pt tending to change positions rapidly and required VC's for safety to protect L shoulder.  Transfers Overall transfer level: Needs assistance   Transfers: Sit to/from Stand Sit to Stand: Min guard         General transfer comment: Pt moves quickly, min guard for safety    Balance Overall balance assessment: Needs assistance Sitting-balance support: No upper extremity supported Sitting balance-Leahy Scale: Good Sitting balance - Comments: sitting EOB with no back support   Standing balance support: Single extremity supported Standing balance-Leahy Scale: Fair Standing balance comment: Min guard assist for standing tasks min assist for ambulation.                   ADL Overall ADL's : Needs assistance/impaired     Grooming: Min guard;Wash/dry hands;Standing Grooming Details (indicate cue type and reason): washed hand after toileting at sink level                 Toilet Transfer: Minimal assistance;Comfort height toilet   Toileting- Clothing Manipulation and Hygiene: Minimal assistance;Sit to/from stand Toileting - Clothing Manipulation Details (indicate cue type and reason): Min assist for clothing management     Functional mobility during ADLs: Minimal assistance General ADL Comments: Pt remains impulsive, reinforced NWB and exercise program.      Vision                 Additional Comments: Wearing glasses   Perception     Praxis      Cognition   Behavior During Therapy: WFL for tasks assessed/performed Overall Cognitive Status: Within Functional Limits for tasks assessed  Memory: Decreased recall of precautions               Extremity/Trunk Assessment               Exercises Shoulder Exercises Pendulum  Exercise: Left;10 reps;Other (comment);Standing (circles, horizontal, vertical) Shoulder Flexion: PROM;Self ROM;10 reps;Supine;Other (comment) (0-90; able to achieve ~5 degrees) Shoulder External Rotation: PROM;Self ROM;10 reps;Seated;Other (comment) (to neutral only) Elbow Flexion: AROM;10 reps;Seated Wrist Flexion: AROM;10 reps;Seated Digit Composite Flexion: AROM;10 reps;Seated Donning/doffing sling/immobilizer: Moderate assistance Correct positioning of sling/immobilizer: Minimal assistance Pendulum exercises (written home exercise program): Minimal assistance ROM for elbow, wrist and digits of operated UE: Supervision/safety Sling wearing schedule (on at all times/off for ADL's): Supervision/safety Proper positioning of operated UE when showering: Minimal assistance Positioning of UE while sleeping: Minimal assistance   Shoulder Instructions Shoulder Instructions Donning/doffing sling/immobilizer: Moderate assistance Correct positioning of sling/immobilizer: Minimal assistance Pendulum exercises (written home exercise program): Minimal assistance ROM for elbow, wrist and digits of operated UE: Supervision/safety Sling wearing schedule (on at all times/off for ADL's): Supervision/safety Proper positioning of operated UE when showering: Minimal assistance Positioning of UE while sleeping: Minimal assistance     General Comments      Pertinent Vitals/ Pain       Pain Assessment: 0-10 Pain Score: 10-Worst pain ever Faces Pain Scale: Hurts even more Pain Location: L shoulder Pain Descriptors / Indicators: Grimacing;Operative site guarding;Sore Pain Intervention(s): Limited activity within patient's tolerance;Monitored during session;Patient requesting pain meds-RN notified  Home Living                                          Prior Functioning/Environment              Frequency  Min 2X/week        Progress Toward Goals  OT Goals(current goals can  now be found in the care plan section)  Progress towards OT goals: Progressing toward goals  Acute Rehab OT Goals Patient Stated Goal: to go home today OT Goal Formulation: With patient/family Time For Goal Achievement: 05/06/16 Potential to Achieve Goals: Good ADL Goals Pt Will Perform Upper Body Bathing: with min assist;with caregiver independent in assisting;sitting Pt Will Perform Upper Body Dressing: with min assist;with caregiver independent in assisting;sitting Pt Will Transfer to Toilet: with supervision;ambulating;regular height toilet Pt Will Perform Toileting - Clothing Manipulation and hygiene: with supervision;sit to/from stand Pt Will Perform Tub/Shower Transfer: with supervision;Shower transfer;shower seat;rolling walker;ambulating Pt/caregiver will Perform Home Exercise Program: Left upper extremity;Increased ROM;With written HEP provided  Plan Discharge plan remains appropriate    Co-evaluation                 End of Session Equipment Utilized During Treatment: Gait belt   Activity Tolerance Patient limited by pain;Treatment limited secondary to medical complications (Comment) (O2 more stable, required pursed lip breathing techniques)   Patient Left in bed;with call bell/phone within reach;with family/visitor present;with SCD's reapplied   Nurse Communication Mobility status        Time: [id number] OT Time Calculation (min): 42 min  Charges: OT General Charges $OT Visit: 1 Procedure OT Treatments $Self Care/Home Management : 38-52 mins  Doristine SectionCharity A Taylyn Brame, OTR/L [phone] 04/22/2016, 12:35 PM

## 2016-04-22 NOTE — Discharge Summary (Signed)
. Patient ID: Brianna Riley MRN: [id number] DOB/AGE: [birth year] 59 y.o.  Admit date: 04/21/2016 Discharge date: 04/22/2016  Admission Diagnoses:  Active Problems:   S/P shoulder replacement, left   Discharge Diagnoses:  Same  Past Medical History:  Diagnosis Date  . Allergic rhinitis, cause unspecified   . Dyspnea   . Fatty liver   . GERD (gastroesophageal reflux disease)   . Hyperlipidemia   . Hypertension, essential, benign   . Insomnia   . Knee pain, bilateral   . Lumbago   . Migraine   . Mild depression (HCC)   . Nonspecific abnormal results of liver function study   . Obesity   . Osteoarthritis   . Perimenopausal     Surgeries: Procedure(s): LEFT TOTAL SHOULDER ARTHROPLASTY on 04/21/2016   Consultants:   Discharged Condition: Improved  Hospital Course: Brianna Riley is an 60 y.o. female who was admitted 04/21/2016 for operative treatment of left shoulder end stage OA. Patient has severe unremitting pain that affects sleep, daily activities, and work/hobbies. After pre-op clearance the patient was taken to the operating room on 04/21/2016 and underwent  Procedure(s): LEFT TOTAL SHOULDER ARTHROPLASTY.    Patient was given perioperative antibiotics: Anti-infectives    Start     Dose/Rate Route Frequency Ordered Stop   04/21/16 1730  ceFAZolin (ANCEF) IVPB 2g/100 mL premix     2 g 200 mL/hr over 30 Minutes Intravenous Every 6 hours 04/21/16 1637 04/22/16 0618   04/21/16 0554  ceFAZolin (ANCEF) 2-4 GM/100ML-% IVPB    Comments:  Edwina Barth   : cabinet override      04/21/16 0554 04/21/16 0747   04/21/16 0540  ceFAZolin (ANCEF) IVPB 2g/100 mL premix     2 g 200 mL/hr over 30 Minutes Intravenous On call to O.R. 04/21/16 0540 04/21/16 0747       Patient was given sequential compression devices, early ambulation, and ASA to prevent DVT.  Patient benefited maximally from hospital stay and there were no complications.    Recent vital signs: Patient  Vitals for the past 24 hrs:  BP Temp Temp src Pulse Resp SpO2  04/22/16 0441 113/72 98.2 F (36.8 C) Oral (!) 112 16 94 %  04/22/16 0002 127/63 98.8 F (37.1 C) Oral (!) 102 18 92 %  04/21/16 1940 114/64 98 F (36.7 C) Oral 100 18 99 %  04/21/16 1720 90/65 - - - - -  04/21/16 1620 (!) 86/63 98.4 F (36.9 C) Oral (!) 110 18 97 %  04/21/16 1545 - 98.1 F (36.7 C) - (!) 102 20 100 %  04/21/16 1430 105/67 - - (!) 112 - 100 %  04/21/16 1415 103/60 - - (!) 102 17 97 %  04/21/16 1409 (!) 84/64 - - - - -  04/21/16 1345 - - - (!) 101 14 (!) 67 %  04/21/16 1330 - - - (!) 101 15 100 %  04/21/16 1315 (!) 88/67 - - (!) 104 19 100 %  04/21/16 1209 (!) 85/56 - - 92 17 99 %  04/21/16 1200 - - - 92 18 (!) 87 %  04/21/16 1145 - - - 86 14 (!) 68 %  04/21/16 1127 (!) 89/53 - - - 16 -  04/21/16 1115 - - - - 20 99 %  04/21/16 1059 - - - - 20 -  04/21/16 1039 - - - (!) 103 16 98 %  04/21/16 1030 - - - (!) 102 17 100 %  04/21/16 1015 - 97.3 F (  36.3 C) - (!) 109 16 99 %     Recent laboratory studies:  Recent Labs  04/19/16 1345 04/22/16 0630  WBC 8.1 9.7  HGB 12.8 12.2  HCT 39.8 39.1  PLT 222 202  NA 143 143  K 4.1 4.4  CL 112* 109  CO2 24 25  BUN 18 11  CREATININE 1.11* 1.10*  GLUCOSE 99 122*  INR 0.93  --   CALCIUM 9.7 8.9     Discharge Medications:     Medication List    TAKE these medications   aspirin 325 MG tablet Take 650-1,000 mg by mouth See admin instructions. Take 650 mg by mouth in the morning and take 1000 mg by mouth in the evening   atorvastatin 80 MG tablet Commonly known as:  LIPITOR Take 1 tablet (80 mg total) by mouth daily.   docusate sodium 100 MG capsule Commonly known as:  COLACE Take 1 capsule (100 mg total) by mouth 3 (three) times daily as needed.   fish oil-omega-3 fatty acids 1000 MG capsule Take 1 capsule by mouth daily.   Flax Seed Oil 1000 MG Caps Take 1,000 mg by mouth daily.   Garcinia Cambogia-Chromium 500-200 MG-MCG Tabs Take 1  tablet by mouth daily.   Garlic Oil 500 MG Caps Take 161500 mg by mouth daily.   losartan 50 MG tablet Commonly known as:  COZAAR Take 1 tablet (50 mg total) by mouth daily.   MELATONIN PO Take 1 tablet by mouth daily.   milk thistle 175 MG tablet Take 175 mg by mouth daily.   Multivitamin/Iron Tabs Take 1 tablet by mouth daily.   niacin 500 MG tablet Commonly known as:  SLO-NIACIN Take 500 mg by mouth at bedtime.   oxyCODONE-acetaminophen 5-325 MG tablet Commonly known as:  ROXICET Take 1-2 tablets by mouth every 4 (four) hours as needed for severe pain.   pantoprazole 40 MG tablet Commonly known as:  PROTONIX Take 1 tablet (40 mg total) by mouth daily.   pregabalin 50 MG capsule Commonly known as:  LYRICA Take 1 capsule (50 mg total) by mouth 3 (three) times daily.   traMADol 50 MG tablet Commonly known as:  ULTRAM take 1 to 2 tablets by mouth every 8 hours (MAX 6 TABLETS PER DAY) What changed:  See the new instructions.       Diagnostic Studies: Dg Chest 2 View  Result Date: 04/19/2016 CLINICAL DATA:  Pre-op for left total shoulder arthroplasty on 04/21/2016.No chest complaints.Hx of HTN, GERD. EXAM: CHEST  2 VIEW COMPARISON:  12/04/2012 FINDINGS: Cardiac silhouette is normal in size. No mediastinal or hilar masses. No evidence of adenopathy. Streaky opacity is noted at the left lung base consistent with scarring or atelectasis. Lungs are otherwise clear. No pleural effusion or pneumothorax. An calcification projects below the left coracoid process which may reflect an intra-articular body within the subcoracoid recess. IMPRESSION: No active cardiopulmonary disease. Electronically Signed   By: Amie Portlandavid  Ormond M.D.   On: 04/19/2016 15:07   Dg Shoulder Left Port  Result Date: 04/21/2016 CLINICAL DATA:  Status post total shoulder arthroplasty EXAM: LEFT SHOULDER - 1 VIEW COMPARISON:  CT left shoulder February 25, 2016 FINDINGS: Frontal view with external rotation  obtained. There is a total shoulder prosthesis on the left which appears well seated. No acute fracture or dislocation. There is mild narrowing of the acromioclavicular joint. There is atelectatic change in the left lung base. IMPRESSION: Total shoulder prosthesis on the left well seated.  No fracture or dislocation. Atelectasis left lung base. Narrowing left acromioclavicular joint. Electronically Signed   By: Bretta BangWilliam  Woodruff III M.D.   On: 04/21/2016 10:44    Disposition: 01-Home or Self Care  Discharge Instructions    Call MD / Call 911    Complete by:  As directed    If you experience chest pain or shortness of breath, CALL 911 and be transported to the hospital emergency room.  If you develope a fever above 101 F, pus (white drainage) or increased drainage or redness at the wound, or calf pain, call your surgeon's office.   Constipation Prevention    Complete by:  As directed    Drink plenty of fluids.  Prune juice may be helpful.  You may use a stool softener, such as Colace (over the counter) 100 mg twice a day.  Use MiraLax (over the counter) for constipation as needed.   Diet - low sodium heart healthy    Complete by:  As directed    Increase activity slowly as tolerated    Complete by:  As directed       Follow-up Information    Mable ParisHANDLER,JUSTIN WILLIAM, MD. Schedule an appointment as soon as possible for a visit in 2 weeks.   Specialty:  Orthopedic Surgery Contact information: [address] [place] [address] [phone]            Signed: Jiles HaroldLALIBERTE, Toshiro Hanken 04/22/2016, 8:32 AM

## 2016-05-06 DIAGNOSIS — Z96612 Presence of left artificial shoulder joint: Secondary | ICD-10-CM | POA: Diagnosis not present

## 2016-05-06 DIAGNOSIS — M19012 Primary osteoarthritis, left shoulder: Secondary | ICD-10-CM | POA: Diagnosis not present

## 2016-05-06 DIAGNOSIS — Z471 Aftercare following joint replacement surgery: Secondary | ICD-10-CM | POA: Diagnosis not present

## 2016-06-03 DIAGNOSIS — M19012 Primary osteoarthritis, left shoulder: Secondary | ICD-10-CM | POA: Diagnosis not present

## 2016-06-23 ENCOUNTER — Ambulatory Visit: Payer: BLUE CROSS/BLUE SHIELD | Admitting: Rehabilitative and Restorative Service Providers"

## 2016-06-27 ENCOUNTER — Encounter: Payer: Self-pay | Admitting: Physical Therapy

## 2016-06-27 ENCOUNTER — Ambulatory Visit (INDEPENDENT_AMBULATORY_CARE_PROVIDER_SITE_OTHER): Payer: BLUE CROSS/BLUE SHIELD | Admitting: Physical Therapy

## 2016-06-27 DIAGNOSIS — R293 Abnormal posture: Secondary | ICD-10-CM

## 2016-06-27 DIAGNOSIS — M25612 Stiffness of left shoulder, not elsewhere classified: Secondary | ICD-10-CM | POA: Diagnosis not present

## 2016-06-27 DIAGNOSIS — R600 Localized edema: Secondary | ICD-10-CM | POA: Diagnosis not present

## 2016-06-27 DIAGNOSIS — M6281 Muscle weakness (generalized): Secondary | ICD-10-CM | POA: Diagnosis not present

## 2016-06-27 NOTE — Therapy (Signed)
[facility] [address] [place], [place], [address] Phone: [phone]   Fax:  [phone]  Physical Therapy Evaluation  Patient Details  Name: Brianna Riley MRN: [id number] Date of Birth: 11-25-1955 Referring Provider: Dr Ave Filter  Encounter Date: 06/27/2016      PT End of Session - 06/27/16 1557    Visit Number 1   Number of Visits 12   Date for PT Re-Evaluation 08/08/16   PT Start Time 1515   PT Stop Time 1559   PT Time Calculation (min) 44 min   Activity Tolerance Patient tolerated treatment well      Past Medical History:  Diagnosis Date  . Allergic rhinitis, cause unspecified   . Dyspnea   . Fatty liver   . GERD (gastroesophageal reflux disease)   . Hyperlipidemia   . Hypertension, essential, benign   . Insomnia   . Knee pain, bilateral   . Lumbago   . Migraine   . Mild depression (HCC)   . Nonspecific abnormal results of liver function study   . Obesity   . Osteoarthritis   . Perimenopausal     Past Surgical History:  Procedure Laterality Date  . CARDIAC CATHETERIZATION  09/2006   Clean, Dr. Jens Som  . CATARACT EXTRACTION W/ INTRAOCULAR LENS IMPLANT  2002   Right and Left  . DOBUTAMINE STRESS ECHO  08/2008  . LAPAROSCOPY ABDOMEN DIAGNOSTIC    . TOTAL SHOULDER ARTHROPLASTY Left 04/21/2016   Procedure: LEFT TOTAL SHOULDER ARTHROPLASTY;  Surgeon: Jones Broom, MD;  Location: [facility] OR;  Service: Orthopedics;  Laterality: Left;  LEFT TOTAL SHOULDER ARTHROPLASTY    There were no vitals filed for this visit.       Subjective Assessment - 06/27/16 1519    Subjective Pt had elective Lt total shoulder replacement 04/21/16, ~ 9 weeks ago.  She stopped wearing her sling about a week later as it was hurting her.  Had 3 falls that night on the right side , she had trouble moving, sleeping in the recliner for weeks.  In the bed now.  She has been trying to move the arm in all directions. 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    Currently in Pain? Yes   Pain Score 3    Pain Location Elbow   Pain Orientation Left   Pain Descriptors / Indicators Numbness;Aching   Pain Type Acute pain   Pain Radiating Towards into the last 4 fingers, started this morning.    Pain Onset Today   Pain Frequency Intermittent   Aggravating Factors  nothing   Pain Relieving Factors rubbing it   Multiple Pain Sites No  in the shoulder, has it when she lifts the arm.             [facility] PT Assessment - 06/27/16 0001      Assessment   Medical Diagnosis Lt total shoulder replacement   Referring Provider Dr Ave Filter   Onset Date/Surgical Date 04/21/16   Hand Dominance Right   Next MD Visit 07/22/16   Prior Therapy none     Precautions   Precautions None  per patient     Balance Screen   Has the patient fallen in the past 6 months Yes  How many times? 3  the night after surgery   Has the patient had a decrease in activity level because of a fear of falling?  No   Is the patient reluctant to leave their home because of a fear of falling?  No     Home [employer] information centre manager residence   Living Arrangements Spouse/significant other   Home Layout Two level     Prior Function   Level of Independence Needs assistance with ADLs  donning shirts and bra   Vocation Unemployed   Leisure watch TV     Observation/Other Assessments   Focus on Therapeutic Outcomes (FOTO)  55% limited     Observation/Other Assessments-Edema    Edema --  irritation in the axilla - rash     Posture/Postural Control   Posture/Postural Control Postural limitations   Postural Limitations Rounded Shoulders;Forward head     ROM / Strength   AROM / PROM / Strength AROM;PROM;Strength     AROM    AROM Assessment Site Elbow;Forearm;Wrist;Cervical;Shoulder   Right/Left Shoulder Right;Left  Rt WNL full   Left Shoulder Extension 43 Degrees   Left Shoulder Flexion 95 Degrees   Left Shoulder ABduction 72 Degrees   Left Shoulder Internal Rotation 68 Degrees   Left Shoulder External Rotation 42 Degrees   Right/Left Elbow --  bilat WNL   Right/Left Forearm --  bilat WNL   Right/Left Wrist --  bilat WNL   Cervical Flexion WNL   Cervical Extension WNL   Cervical - Right Rotation 58   Cervical - Left Rotation 52     Palpation   Palpation comment decreased scar mobility     Special Tests    Special Tests --  (-) upper neuron restrictions                    [facility] Adult PT Treatment/Exercise - 06/27/16 0001      Exercises   Exercises --  per handout, Clinton Sawyer shoudler, isometrics                 PT Education - 06/27/16 1730    Education provided Yes   Education Details HEP and initial posture education   Person(s) Educated Patient   Methods Explanation;Demonstration;Handout   Comprehension Returned demonstration;Verbalized understanding             PT Long Term Goals - 06/27/16 1735      PT LONG TERM GOAL #1   Title I with advanced HEP ( 08/08/16)    Time 6   Period Weeks   Status New     PT LONG TERM GOAL #2   Title demo Lt shoulder ROM WFL to allow her to reach top shelf and hook her bra ( 08/08/16)    Time 6   Period Weeks   Status New     PT LONG TERM GOAL #3   Title demo Lt shoulder strength =/> 4+/5 in all directions ( 08/08/16)    Time 6   Period Weeks   Status New     PT LONG TERM GOAL #4   Title verbalize and demonstrate proper upright body mechanics to protect the shoulder joint ( 08/08/16)    Time 6   Period Weeks   Status New     PT LONG TERM GOAL #5   Title improve FOTO =/< 40% limited ( 08/08/16)    Time 6   Period Weeks   Status New  Plan - 06/27/16 1731    Clinical Impression Statement 61 yo  female ~ 9 wks s/p Lt total shoulder replacement.  She is just now coming to therapy as she said she " didn't know what happened, she just didn't call to schedule".  She has been moving her arm around as she has been able, not wearing her sling.  Pain has been manageable in the shoulder, has developed some pain in her hand today.  Pt has significant postural changes in the upper body that lead to poor UE mechanics, decreased ROM and strength.  She has some edema in her Lt pecs, decreased scar mobility . Will contact MD office to get clarification of protocol as patient is doing what she can on her own.    Rehab Potential Good   PT Frequency 2x / week   PT Duration 6 weeks   PT Treatment/Interventions Moist Heat;Ultrasound;Therapeutic exercise;Dry needling;Taping;Vasopneumatic Device;Manual techniques;Neuromuscular re-education;Cryotherapy;Electrical Stimulation;Iontophoresis 4mg /ml Dexamethasone;Patient/family education;Passive range of motion   PT Next Visit Plan review HEP, progress ROM,   Consulted and Agree with Plan of Care Patient      Patient will benefit from skilled therapeutic intervention in order to improve the following deficits and impairments:  Decreased range of motion, Impaired UE functional use, Increased muscle spasms, Decreased knowledge of precautions, Pain, Improper body mechanics, Decreased scar mobility, Increased edema, Decreased strength  Visit Diagnosis: Stiffness of left shoulder, not elsewhere classified - Plan: PT plan of care cert/re-cert  Muscle weakness (generalized) - Plan: PT plan of care cert/re-cert  Localized edema - Plan: PT plan of care cert/re-cert  Abnormal posture - Plan: PT plan of care cert/re-cert     Problem List Patient Active Problem List   Diagnosis Date Noted  . 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 IFG (impaired fasting glucose) 04/20/2016  . Fatty liver 04/06/2014  . Cervical radiculitis 12/28/2012  . Chronic kidney disease  (CKD) stage G3b/A1, moderately decreased glomerular filtration rate (GFR) between 30-44 mL/min/1.73 square meter and albuminuria creatinine ratio less than 30 mg/g 12/04/2012  . Osteoarthritis of both shoulders 02/10/2012  . Right anterior knee pain 02/10/2012  . RETINOPATHY 03/15/2010  . RENAL INSUFFICIENCY 03/15/2010  . 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 FREQUENCY, URINARY 12/08/2009  . ABNORMAL MAMMOGRAM 08/19/2009  . ALLERGIC RHINITIS CAUSE UNSPECIFIED 08/06/2008  . HYPOXEMIA 08/06/2008  . LUMBAGO 05/16/2008  . LIVER FUNCTION TESTS, ABNORMAL 11/07/2007  . DEPRESSION, MILD 03/30/2007  . Hyperlipidemia 10/04/2006  . GERD 10/04/2006  . HYPERTENSION, BENIGN ESSENTIAL 09/05/2006    Roderic ScarceSusan Shaver PT  06/27/2016, 5:41 PM  [facility] [address] [place], [place], [address] Phone: [phone]   Fax:  [phone]  Name: Brianna Bellingheresa Riley MRN: [id number] Date of Birth: 30-Jan-1956

## 2016-06-30 ENCOUNTER — Encounter: Payer: Self-pay | Admitting: Physical Therapy

## 2016-06-30 ENCOUNTER — Ambulatory Visit (INDEPENDENT_AMBULATORY_CARE_PROVIDER_SITE_OTHER): Payer: BLUE CROSS/BLUE SHIELD | Admitting: Physical Therapy

## 2016-06-30 DIAGNOSIS — R293 Abnormal posture: Secondary | ICD-10-CM | POA: Diagnosis not present

## 2016-06-30 DIAGNOSIS — M6281 Muscle weakness (generalized): Secondary | ICD-10-CM

## 2016-06-30 DIAGNOSIS — M25612 Stiffness of left shoulder, not elsewhere classified: Secondary | ICD-10-CM | POA: Diagnosis not present

## 2016-06-30 DIAGNOSIS — R600 Localized edema: Secondary | ICD-10-CM | POA: Diagnosis not present

## 2016-06-30 NOTE — Therapy (Signed)
[facility] [place], [place], [address] Phone: [phone]   Fax:  [phone]  Physical Therapy Treatment  Patient Details  Name: Brianna Riley MRN: [id number] Date of Birth: [birth year] Referring Provider: Dr Tamera Punt  Encounter Date: 06/30/2016      PT End of Session - 06/30/16 1407    Visit Number 2   Number of Visits 12   Date for PT Re-Evaluation 08/08/16   PT Start Time 1601   PT Stop Time 1445   PT Time Calculation (min) 40 min   Activity Tolerance Patient tolerated treatment well      Past Medical History:  Diagnosis Date  . Allergic rhinitis, cause unspecified   . Dyspnea   . Fatty liver   . GERD (gastroesophageal reflux disease)   . Hyperlipidemia   . Hypertension, essential, benign   . Insomnia   . Knee pain, bilateral   . Lumbago   . Migraine   . Mild depression ([place])   . Nonspecific abnormal results of liver function study   . Obesity   . Osteoarthritis   . Perimenopausal     Past Surgical History:  Procedure Laterality Date  . CARDIAC CATHETERIZATION  09/2006   Clean, Dr. Stanford Breed  . CATARACT EXTRACTION W/ INTRAOCULAR LENS IMPLANT  2002   Right and Left  . DOBUTAMINE STRESS ECHO  08/2008  . LAPAROSCOPY ABDOMEN DIAGNOSTIC    . TOTAL SHOULDER ARTHROPLASTY Left 04/21/2016   Procedure: LEFT TOTAL SHOULDER ARTHROPLASTY;  Surgeon: Tania Ade, MD;  Location: [facility];  Service: Orthopedics;  Laterality: Left;  LEFT TOTAL SHOULDER ARTHROPLASTY    There were no vitals filed for this visit.      Subjective Assessment - 06/30/16 1406    Subjective Pt reports she had a fall on Monday evening and is really sore through her low back and hip, the shoulder is ok. Has not performed her HEP as she has been sore from her fall.    Patient Stated Goals move her arm.    Currently in Pain? No/denies                         [facility] Adult PT Treatment/Exercise - 06/30/16  0001      Exercises   Exercises Shoulder     Shoulder Exercises: Supine   Horizontal ABduction Strengthening;Both;10 reps;Theraband   Theraband Level (Shoulder Horizontal ABduction) Level 1 (Yellow)   External Rotation AROM;Both;10 reps  hands behind head, elbow presses down   Flexion AROM;Both;10 reps  with strap around both wrists   Other Supine Exercises thoracic lifts     Shoulder Exercises: Standing   Flexion Strengthening;Left;5 reps  shoulder ladder     Shoulder Exercises: Pulleys   Flexion 2 minutes   ABduction 2 minutes  scaption     Shoulder Exercises: ROM/Strengthening   UBE (Upper Arm Bike) L1x 2' alt FWD/BWD, struggled with going backwards     Shoulder Exercises: Isometric Strengthening   External Rotation 5X10"                PT Education - 06/30/16 1444    Education provided Yes   Education Details walking program   Person(s) Educated Patient;Spouse   Methods Explanation;Handout   Comprehension Verbalized understanding             PT Long Term Goals - 06/27/16 1735      PT LONG TERM GOAL #1   Title I with  advanced HEP ( 08/08/16)    Time 6   Period Weeks   Status New     PT LONG TERM GOAL #2   Title demo Lt shoulder ROM WFL to allow her to reach top shelf and hook her bra ( 08/08/16)    Time 6   Period Weeks   Status New     PT LONG TERM GOAL #3   Title demo Lt shoulder strength =/> 4+/5 in all directions ( 08/08/16)    Time 6   Period Weeks   Status New     PT LONG TERM GOAL #4   Title verbalize and demonstrate proper upright body mechanics to protect the shoulder joint ( 08/08/16)    Time 6   Period Weeks   Status New     PT LONG TERM GOAL #5   Title improve FOTO =/< 40% limited ( 08/08/16)    Time 6   Period Weeks   Status New               Plan - 06/30/16 1413    Clinical Impression Statement This is Brianna Riley's second visit, no goals met, She is now 10 weeks post op from her surgery. has limited Lt shoulder motion,  postural abnormalities and weakness. Issued a walking program to help with overall health and promote upright posture/strength   Rehab Potential Good   PT Frequency 2x / week   PT Duration 6 weeks   PT Treatment/Interventions Moist Heat;Ultrasound;Therapeutic exercise;Dry needling;Taping;Vasopneumatic Device;Manual techniques;Neuromuscular re-education;Cryotherapy;Electrical Stimulation;Iontophoresis 54m/ml Dexamethasone;Patient/family education;Passive range of motion      Patient will benefit from skilled therapeutic intervention in order to improve the following deficits and impairments:  Decreased range of motion, Impaired UE functional use, Increased muscle spasms, Decreased knowledge of precautions, Pain, Improper body mechanics, Decreased scar mobility, Increased edema, Decreased strength  Visit Diagnosis: Stiffness of left shoulder, not elsewhere classified  Muscle weakness (generalized)  Localized edema  Abnormal posture     Problem List Patient Active Problem List   Diagnosis Date Noted  . 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 IFG (impaired fasting glucose) 04/20/2016  . Fatty liver 04/06/2014  . Cervical radiculitis 12/28/2012  . Chronic kidney disease (CKD) stage G3b/A1, moderately decreased glomerular filtration rate (GFR) between 30-44 mL/min/1.73 square meter and albuminuria creatinine ratio less than 30 mg/g 12/04/2012  . Osteoarthritis of both shoulders 02/10/2012  . Right anterior knee pain 02/10/2012  . RETINOPATHY 03/15/2010  . RENAL INSUFFICIENCY 03/15/2010  . 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 FREQUENCY, URINARY 12/08/2009  . ABNORMAL MAMMOGRAM 08/19/2009  . ALLERGIC RHINITIS CAUSE UNSPECIFIED 08/06/2008  . HYPOXEMIA 08/06/2008  . LUMBAGO 05/16/2008  . LIVER FUNCTION TESTS, ABNORMAL 11/07/2007  . DEPRESSION, MILD 03/30/2007  . Hyperlipidemia 10/04/2006  . GERD 10/04/2006  . HYPERTENSION, BENIGN ESSENTIAL 09/05/2006     SJeral PinchPT  06/30/2016, 2:48 PM  [facility] [address]Phone: [phone]  Fax:  [phone] Name: Brianna TrinidadMRN: [id number] of Birth: 906/09/1955

## 2016-06-30 NOTE — Patient Instructions (Signed)

## 2016-07-04 ENCOUNTER — Ambulatory Visit (INDEPENDENT_AMBULATORY_CARE_PROVIDER_SITE_OTHER): Payer: BLUE CROSS/BLUE SHIELD | Admitting: Physical Therapy

## 2016-07-04 DIAGNOSIS — M6281 Muscle weakness (generalized): Secondary | ICD-10-CM | POA: Diagnosis not present

## 2016-07-04 DIAGNOSIS — R293 Abnormal posture: Secondary | ICD-10-CM

## 2016-07-04 DIAGNOSIS — R600 Localized edema: Secondary | ICD-10-CM

## 2016-07-04 DIAGNOSIS — M25612 Stiffness of left shoulder, not elsewhere classified: Secondary | ICD-10-CM

## 2016-07-04 NOTE — Therapy (Signed)
[facility] [address] [place], [place], [address] Phone: [phone]   Fax:  [phone]  Physical Therapy Treatment  Patient Details  Name: Brianna Riley MRN: [id number] Date of Birth: 1955/09/24 Referring Provider: Dr. Ave Filterhandler   Encounter Date: 07/04/2016      PT End of Session - 07/04/16 1433    Visit Number 3   Number of Visits 12   Date for PT Re-Evaluation 08/08/16   PT Start Time 1433   PT Stop Time 1529   PT Time Calculation (min) 56 min   Activity Tolerance Patient tolerated treatment well   Behavior During Therapy [facility] for tasks assessed/performed      Past Medical History:  Diagnosis Date  . Allergic rhinitis, cause unspecified   . Dyspnea   . Fatty liver   . GERD (gastroesophageal reflux disease)   . Hyperlipidemia   . Hypertension, essential, benign   . Insomnia   . Knee pain, bilateral   . Lumbago   . Migraine   . Mild depression ([facility])   . Nonspecific abnormal results of liver function study   . Obesity   . Osteoarthritis   . Perimenopausal     Past Surgical History:  Procedure Laterality Date  . CARDIAC CATHETERIZATION  09/2006   Clean, Dr. Jens Somrenshaw  . CATARACT EXTRACTION W/ INTRAOCULAR LENS IMPLANT  2002   Right and Left  . DOBUTAMINE STRESS ECHO  08/2008  . LAPAROSCOPY ABDOMEN DIAGNOSTIC    . TOTAL SHOULDER ARTHROPLASTY Left 04/21/2016   Procedure: LEFT TOTAL SHOULDER ARTHROPLASTY;  Surgeon: Jones BroomJustin Chandler, MD;  [facility];  Service: Orthopedics;  Laterality: Left;  LEFT TOTAL SHOULDER ARTHROPLASTY    There were no vitals filed for this visit.      Subjective Assessment - 07/04/16 1437    Subjective Pt reports her Lt shoulder is sore and stiff.  "I have 6 months of laundry to catch up on"    Currently in Pain? Yes   Pain Score 5    Pain Location Shoulder   Pain Orientation Left   Pain Descriptors / Indicators Dull;Aching   Aggravating Factors  over stretch   Pain  Relieving Factors rest, "when I don't do nothing".             [facility] PT Assessment - 07/04/16 0001      Assessment   Medical Diagnosis Lt total shoulder replacement   Referring Provider Dr. Ave Filterhandler    Onset Date/Surgical Date 04/21/16   Hand Dominance Right   Next MD Visit 07/22/16   Prior Therapy none     AROM   Right/Left Shoulder Left   Left Shoulder Flexion 104 Degrees  standing, 144 deg supine AAROM with cane   Left Shoulder Internal Rotation --  to glute; to L5 with AAROM from RUE.    Left Shoulder External Rotation 68 Degrees  supine, arm abdct 65 deg          [facility] Adult PT Treatment/Exercise - 07/04/16 0001      Shoulder Exercises: Supine   Horizontal ABduction Strengthening;Both;10 reps;Theraband   Theraband Level (Shoulder Horizontal ABduction) Level 1 (Yellow)   External Rotation AAROM;Left;10 reps  with cane.  multiple VC/tactile cue to correct form.    Theraband Level (Shoulder External Rotation) Level 1 (Yellow)  10 reps, bilat   Flexion AAROM;Both;10 reps  with cane.    Flexion Limitations bench press with cane, then overhead pull with cane.    Other Supine Exercises thoracic  lifts x 5 sec hold x 10 reps   Other Supine Exercises Sash motion LUE x 5 reps, then with yellow band x 4 reps (stopped due to pain last rep).      Shoulder Exercises: Standing   Other Standing Exercises Wall ladder with LUE x 5 reps. (up to #20)     Shoulder Exercises: ROM/Strengthening   UBE (Upper Arm Bike) L1: 1.5 min forward, 1.5 min backward  (seated)     Shoulder Exercises: Stretch   Internal Rotation Stretch 5 reps  AAROM from RUE   Other Shoulder Stretches hands behind head pressing elbows into table x 10     Modalities   Modalities Electrical Stimulation;Moist Heat     Moist Heat Therapy   Number Minutes Moist Heat 15 Minutes   Moist Heat Location Shoulder  LUE     Electrical Stimulation   Electrical Stimulation Location Lt shoulder    Electrical  Stimulation Action IFC   Electrical Stimulation Parameters to tolerance    Electrical Stimulation Goals Pain     Manual Therapy   Manual Therapy Myofascial release   Myofascial Release to Lt shoulder around incision (very point tender and sensitive).                      PT Long Term Goals - 06/27/16 1735      PT LONG TERM GOAL #1   Title I with advanced HEP ( 08/08/16)    Time 6   Period Weeks   Status New     PT LONG TERM GOAL #2   Title demo Lt shoulder ROM WFL to allow her to reach top shelf and hook her bra ( 08/08/16)    Time 6   Period Weeks   Status New     PT LONG TERM GOAL #3   Title demo Lt shoulder strength =/> 4+/5 in all directions ( 08/08/16)    Time 6   Period Weeks   Status New     PT LONG TERM GOAL #4   Title verbalize and demonstrate proper upright body mechanics to protect the shoulder joint ( 08/08/16)    Time 6   Period Weeks   Status New     PT LONG TERM GOAL #5   Title improve FOTO =/< 40% limited ( 08/08/16)    Time 6   Period Weeks   Status New               Plan - 07/04/16 1501    Clinical Impression Statement Pt demonstrated improved Lt shoulder ROM this visit. She required frequent VC for posture and technique of exercise.  She tolerated exercises with mild increase in pain (during stretches, expecially with LUE behind back).This pain was reduced with use of estim/MHP.  Progressing towards goals.    Rehab Potential Good   PT Frequency 2x / week   PT Treatment/Interventions Moist Heat;Ultrasound;Therapeutic exercise;Dry needling;Taping;Vasopneumatic Device;Manual techniques;Neuromuscular re-education;Cryotherapy;Electrical Stimulation;Iontophoresis 4mg /ml Dexamethasone;Patient/family education;Passive range of motion   PT Next Visit Plan review HEP, progress ROM,   Consulted and Agree with Plan of Care Patient      Patient will benefit from skilled therapeutic intervention in order to improve the following deficits and  impairments:  Decreased range of motion, Impaired UE functional use, Increased muscle spasms, Decreased knowledge of precautions, Pain, Improper body mechanics, Decreased scar mobility, Increased edema, Decreased strength  Visit Diagnosis: Stiffness of left shoulder, not elsewhere classified  Muscle weakness (generalized)  Localized edema  Abnormal posture     Problem List Patient Active Problem List   Diagnosis Date Noted  . 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 IFG (impaired fasting glucose) 04/20/2016  . Fatty liver 04/06/2014  . Cervical radiculitis 12/28/2012  . Chronic kidney disease (CKD) stage G3b/A1, moderately decreased glomerular filtration rate (GFR) between 30-44 mL/min/1.73 square meter and albuminuria creatinine ratio less than 30 mg/g 12/04/2012  . Osteoarthritis of both shoulders 02/10/2012  . Right anterior knee pain 02/10/2012  . RETINOPATHY 03/15/2010  . RENAL INSUFFICIENCY 03/15/2010  . 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 FREQUENCY, URINARY 12/08/2009  . ABNORMAL MAMMOGRAM 08/19/2009  . ALLERGIC RHINITIS CAUSE UNSPECIFIED 08/06/2008  . HYPOXEMIA 08/06/2008  . LUMBAGO 05/16/2008  . LIVER FUNCTION TESTS, ABNORMAL 11/07/2007  . DEPRESSION, MILD 03/30/2007  . Hyperlipidemia 10/04/2006  . GERD 10/04/2006  . HYPERTENSION, BENIGN ESSENTIAL 09/05/2006   Mayer Camel, PTA 07/04/16 3:18 PM  [facility] [place] [address] [place], [place], [address] Phone: [phone]   Fax:  [phone]  Name: Eisha Chatterjee MRN: [id number] Date of Birth: Dec 02, 1955

## 2016-07-07 ENCOUNTER — Ambulatory Visit (INDEPENDENT_AMBULATORY_CARE_PROVIDER_SITE_OTHER): Payer: BLUE CROSS/BLUE SHIELD | Admitting: Physical Therapy

## 2016-07-07 DIAGNOSIS — M25612 Stiffness of left shoulder, not elsewhere classified: Secondary | ICD-10-CM | POA: Diagnosis not present

## 2016-07-07 DIAGNOSIS — M6281 Muscle weakness (generalized): Secondary | ICD-10-CM

## 2016-07-07 DIAGNOSIS — R293 Abnormal posture: Secondary | ICD-10-CM | POA: Diagnosis not present

## 2016-07-07 DIAGNOSIS — R600 Localized edema: Secondary | ICD-10-CM | POA: Diagnosis not present

## 2016-07-07 NOTE — Therapy (Signed)
[facility] [address] [place], [place], [address] Phone: [phone]   Fax:  [phone]  Physical Therapy Treatment  Patient Details  Name: Brianna Riley MRN: [id number] Date of Birth: 22-Aug-1955 Referring Provider: Dr. Ave Filterhandler   Encounter Date: 07/07/2016      PT End of Session - 07/07/16 1557    Visit Number 4   Number of Visits 12   Date for PT Re-Evaluation 08/08/16   PT Start Time 1315   PT Stop Time 1411   PT Time Calculation (min) 56 min   Activity Tolerance Patient tolerated treatment well   Behavior During Therapy [facility] for tasks assessed/performed      Past Medical History:  Diagnosis Date  . Allergic rhinitis, cause unspecified   . Dyspnea   . Fatty liver   . GERD (gastroesophageal reflux disease)   . Hyperlipidemia   . Hypertension, essential, benign   . Insomnia   . Knee pain, bilateral   . Lumbago   . Migraine   . Mild depression ([facility])   . Nonspecific abnormal results of liver function study   . Obesity   . Osteoarthritis   . Perimenopausal     Past Surgical History:  Procedure Laterality Date  . CARDIAC CATHETERIZATION  09/2006   Clean, Dr. Jens Somrenshaw  . CATARACT EXTRACTION W/ INTRAOCULAR LENS IMPLANT  2002   Right and Left  . DOBUTAMINE STRESS ECHO  08/2008  . LAPAROSCOPY ABDOMEN DIAGNOSTIC    . TOTAL SHOULDER ARTHROPLASTY Left 04/21/2016   Procedure: LEFT TOTAL SHOULDER ARTHROPLASTY;  Surgeon: Jones BroomJustin Chandler, MD;  Location[facility];  Service: Orthopedics;  Laterality: Left;  LEFT TOTAL SHOULDER ARTHROPLASTY    There were no vitals filed for this visit.      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    Currently in Pain? Yes   Pain Score 5    Pain Location Shoulder   Pain Orientation  Left   Pain Descriptors / Indicators Aching;Dull   Pain Type Surgical pain;Acute pain   Pain Onset More than a month ago   Pain Frequency Intermittent   Aggravating Factors  overstretching   Pain Relieving Factors rest                         [facility] Adult PT Treatment/Exercise - 07/07/16 1516      Shoulder Exercises: Supine   External Rotation AAROM;Left;20 reps   External Rotation Limitations with cane   Flexion AAROM;20 reps;Both   Flexion Limitations cane     Shoulder Exercises: Standing   External Rotation Left;10 reps;Theraband   Theraband Level (Shoulder External Rotation) Level 2 (Red)   Internal Rotation Left;10 reps;Theraband   Theraband Level (Shoulder Internal Rotation) Level 2 (Red)   Flexion Left;AROM;10 reps   Flexion Limitations to 90 deg only   Extension Left;10 reps;Theraband   Theraband Level (Shoulder Extension) Level 2 (Red)   Retraction Left;10 reps;Theraband   Theraband Level (Shoulder Retraction) Level 2 (Red)     Shoulder Exercises: ROM/Strengthening   UBE (Upper Arm Bike) L1: 3 min forward, 3 min backward  (seated)     Modalities   Modalities Electrical Stimulation;Moist Heat     Moist Heat Therapy   Number Minutes Moist  Heat 15 Minutes   Moist Heat Location Shoulder     Electrical Stimulation   Electrical Stimulation Location Lt shoulder    Electrical Stimulation Action IFC   Electrical Stimulation Parameters to tolerance   Electrical Stimulation Goals Pain     Manual Therapy   Manual Therapy Myofascial release;Passive ROM   Myofascial Release to Lt shoulder around incision (very point tender and sensitive).    Passive ROM L shoulder flexion, abdct, er/ir in supine                     PT Long Term Goals - 06/27/16 1735      PT LONG TERM GOAL #1   Title I with advanced HEP ( 08/08/16)    Time 6   Period Weeks   Status New     PT LONG TERM GOAL #2   Title demo Lt shoulder ROM WFL to allow her to reach top  shelf and hook her bra ( 08/08/16)    Time 6   Period Weeks   Status New     PT LONG TERM GOAL #3   Title demo Lt shoulder strength =/> 4+/5 in all directions ( 08/08/16)    Time 6   Period Weeks   Status New     PT LONG TERM GOAL #4   Title verbalize and demonstrate proper upright body mechanics to protect the shoulder joint ( 08/08/16)    Time 6   Period Weeks   Status New     PT LONG TERM GOAL #5   Title improve FOTO =/< 40% limited ( 08/08/16)    Time 6   Period Weeks   Status New               Plan - 07/07/16 1557    Clinical Impression Statement Pt tolerated strengthening exercises well, but needs min to mod cues for proper technique.  Pt very sensitive to light touch around incision/scar but reports this is improving.  Will continue to benefit from PT to maximize function.   PT Treatment/Interventions Moist Heat;Ultrasound;Therapeutic exercise;Dry needling;Taping;Vasopneumatic Device;Manual techniques;Neuromuscular re-education;Cryotherapy;Electrical Stimulation;Iontophoresis 4mg /ml Dexamethasone;Patient/family education;Passive range of motion   PT Next Visit Plan review HEP, progress ROM,   Consulted and Agree with Plan of Care Patient      Patient will benefit from skilled therapeutic intervention in order to improve the following deficits and impairments:  Decreased range of motion, Impaired UE functional use, Increased muscle spasms, Decreased knowledge of precautions, Pain, Improper body mechanics, Decreased scar mobility, Increased edema, Decreased strength  Visit Diagnosis: Stiffness of left shoulder, not elsewhere classified  Muscle weakness (generalized)  Localized edema  Abnormal posture     Problem List Patient Active Problem List   Diagnosis Date Noted  . 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 IFG (impaired fasting glucose) 04/20/2016  . Fatty liver 04/06/2014  . Cervical radiculitis 12/28/2012  . Chronic kidney disease (CKD) stage  G3b/A1, moderately decreased glomerular filtration rate (GFR) between 30-44 mL/min/1.73 square meter and albuminuria creatinine ratio less than 30 mg/g 12/04/2012  . Osteoarthritis of both shoulders 02/10/2012  . Right anterior knee pain 02/10/2012  . RETINOPATHY 03/15/2010  . RENAL INSUFFICIENCY 03/15/2010  . 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 FREQUENCY, URINARY 12/08/2009  . ABNORMAL MAMMOGRAM 08/19/2009  . ALLERGIC RHINITIS CAUSE UNSPECIFIED 08/06/2008  . HYPOXEMIA 08/06/2008  . LUMBAGO 05/16/2008  . LIVER FUNCTION TESTS, ABNORMAL 11/07/2007  . DEPRESSION, MILD 03/30/2007  . Hyperlipidemia 10/04/2006  .  GERD 10/04/2006  . HYPERTENSION, BENIGN ESSENTIAL 09/05/2006      Clarita Crane, PT, DPT 07/07/16 4:00 PM    [facility] [address] [place], [place], [address] Phone: [phone]   Fax:  [phone]  Name: Brianna Riley MRN: [id number] Date of Birth: [birth year]

## 2016-07-11 ENCOUNTER — Ambulatory Visit (INDEPENDENT_AMBULATORY_CARE_PROVIDER_SITE_OTHER): Payer: BLUE CROSS/BLUE SHIELD | Admitting: Physical Therapy

## 2016-07-11 DIAGNOSIS — R293 Abnormal posture: Secondary | ICD-10-CM | POA: Diagnosis not present

## 2016-07-11 DIAGNOSIS — M25612 Stiffness of left shoulder, not elsewhere classified: Secondary | ICD-10-CM | POA: Diagnosis not present

## 2016-07-11 DIAGNOSIS — M6281 Muscle weakness (generalized): Secondary | ICD-10-CM

## 2016-07-11 NOTE — Therapy (Signed)
[facility] [address] [place] [address] [place], [place], [address] Phone: [phone]   Fax:  [phone]  Physical Therapy Treatment  Patient Details  Name: Brianna Riley MRN: [id number] Date of Birth: Feb 18, 1956 Referring Provider: Dr. Ave Filter   Encounter Date: 07/11/2016      PT End of Session - 07/11/16 1443    Visit Number 5   Number of Visits 12   Date for PT Re-Evaluation 08/08/16   PT Start Time 1433   PT Stop Time 1529   PT Time Calculation (min) 56 min   Activity Tolerance Patient tolerated treatment well      Past Medical History:  Diagnosis Date  . Allergic rhinitis, cause unspecified   . Dyspnea   . Fatty liver   . GERD (gastroesophageal reflux disease)   . Hyperlipidemia   . Hypertension, essential, benign   . Insomnia   . Knee pain, bilateral   . Lumbago   . Migraine   . Mild depression (HCC)   . Nonspecific abnormal results of liver function study   . Obesity   . Osteoarthritis   . Perimenopausal     Past Surgical History:  Procedure Laterality Date  . CARDIAC CATHETERIZATION  09/2006   Clean, Dr. Jens Som  . CATARACT EXTRACTION W/ INTRAOCULAR LENS IMPLANT  2002   Right and Left  . DOBUTAMINE STRESS ECHO  08/2008  . LAPAROSCOPY ABDOMEN DIAGNOSTIC    . TOTAL SHOULDER ARTHROPLASTY Left 04/21/2016   Procedure: LEFT TOTAL SHOULDER ARTHROPLASTY;  Surgeon: Jones Broom, MD;  Location: [facility];  Service: Orthopedics;  Laterality: Left;  LEFT TOTAL SHOULDER ARTHROPLASTY    There were no vitals filed for this visit.      Subjective Assessment - 07/11/16 1443    Subjective Pt reports she is able to raise her Lt shoulder a little higher. "I feel the pull on the scar".  She has started to massage vit E into incision.    Currently in Pain? No/denies   Pain Score 0-No pain            [facility] PT Assessment - 07/11/16 0001      Assessment   Medical Diagnosis Lt total shoulder replacement   Referring Provider Dr. Ave Filter    Onset Date/Surgical Date 04/21/16   Hand Dominance Right   Next MD Visit 07/15/16     AROM   Left Shoulder Flexion 107 Degrees  standing. supine 144 deg with cane    Lt shoulder ER - PROM 75 deg .       [facility] Adult PT Treatment/Exercise - 07/11/16 0001      Shoulder Exercises: Supine   Horizontal ABduction Strengthening;Both;10 reps;Theraband   Theraband Level (Shoulder Horizontal ABduction) Level 1 (Yellow)   External Rotation AAROM;Left;15 reps   External Rotation Limitations with cane   Flexion AAROM;Both;12 reps   Flexion Limitations cane     Shoulder Exercises: Standing   External Rotation Strengthening;Left   Theraband Level (Shoulder External Rotation) Level 1 (Yellow)   Internal Rotation Strengthening;Left;10 reps;Theraband   Theraband Level (Shoulder Internal Rotation) Level 1 (Yellow)   Flexion Strengthening;Left;10 reps;Theraband   Theraband Level (Shoulder Flexion) Level 1 (Yellow)  rockwood 4    Extension Left;10 reps;Theraband   Theraband Level (Shoulder Extension) Level 1 (Yellow)   Row Strengthening;Both;10 reps;Theraband   Theraband Level (Shoulder Row) Level 2 (Red)   Other Standing Exercises Wall ladder with LUE x 5 reps. (up to #22); scaption up to #19 x 4 reps  Modalities   Modalities Electrical Stimulation;Moist Heat     Moist Heat Therapy   Number Minutes Moist Heat 15 Minutes   Moist Heat Location Shoulder  LUE     Electrical Stimulation   Electrical Stimulation Location Lt shoulder    Electrical Stimulation Action IFC   Electrical Stimulation Parameters to tolerance    Electrical Stimulation Goals Pain     Manual Therapy   Myofascial Release to Lt shoulder around incision (very point tender and sensitive).             PT Long Term Goals - 07/11/16 1522      PT LONG TERM GOAL #1   Title I with advanced HEP ( 08/08/16)    Time 6   Period Weeks   Status On-going     PT LONG TERM GOAL #2    Title demo Lt shoulder ROM WFL to allow her to reach top shelf and hook her bra ( 08/08/16)    Time 6   Period Weeks   Status On-going     PT LONG TERM GOAL #3   Title demo Lt shoulder strength =/> 4+/5 in all directions ( 08/08/16)    Time 6   Period Weeks   Status On-going     PT LONG TERM GOAL #4   Title verbalize and demonstrate proper upright body mechanics to protect the shoulder joint ( 08/08/16)    Time 6   Period Weeks   Status On-going     PT LONG TERM GOAL #5   Title improve FOTO =/< 40% limited ( 08/08/16)    Time 6   Period Weeks   Status On-going               Plan - 07/11/16 1501    Clinical Impression Statement Pt demo slight increase in Lt shoulder ROM. She required min to mod cues for proper technique of exercise. She tolerated all exercises with min increase in pain.  Pt will benefit from continued PT intervention to maximize functional mobility independence.    Rehab Potential Good   PT Frequency 2x / week   PT Duration 6 weeks   PT Treatment/Interventions Moist Heat;Ultrasound;Therapeutic exercise;Dry needling;Taping;Vasopneumatic Device;Manual techniques;Neuromuscular re-education;Cryotherapy;Electrical Stimulation;Iontophoresis 4mg /ml Dexamethasone;Patient/family education;Passive range of motion   PT Next Visit Plan Continue progressive ROM, strengthening for Lt shoulder.    Consulted and Agree with Plan of Care Patient      Patient will benefit from skilled therapeutic intervention in order to improve the following deficits and impairments:  Decreased range of motion, Impaired UE functional use, Increased muscle spasms, Decreased knowledge of precautions, Pain, Improper body mechanics, Decreased scar mobility, Increased edema, Decreased strength  Visit Diagnosis: Stiffness of left shoulder, not elsewhere classified  Muscle weakness (generalized)  Abnormal posture     Problem List Patient Active Problem List   Diagnosis Date Noted  . S/P  shoulder replacement, left 04/21/2016  . IFG (impaired fasting glucose) 04/20/2016  . Fatty liver 04/06/2014  . Cervical radiculitis 12/28/2012  . Chronic kidney disease (CKD) stage G3b/A1, moderately decreased glomerular filtration rate (GFR) between 30-44 mL/min/1.73 square meter and albuminuria creatinine ratio less than 30 mg/g 12/04/2012  . Osteoarthritis of both shoulders 02/10/2012  . Right anterior knee pain 02/10/2012  . RETINOPATHY 03/15/2010  . RENAL INSUFFICIENCY 03/15/2010  . 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 FREQUENCY, URINARY 12/08/2009  . ABNORMAL MAMMOGRAM 08/19/2009  . ALLERGIC RHINITIS CAUSE UNSPECIFIED 08/06/2008  . HYPOXEMIA 08/06/2008  .  LUMBAGO 05/16/2008  . LIVER FUNCTION TESTS, ABNORMAL 11/07/2007  . DEPRESSION, MILD 03/30/2007  . Hyperlipidemia 10/04/2006  . GERD 10/04/2006  . HYPERTENSION, BENIGN ESSENTIAL 09/05/2006   Mayer Camel, PTA 07/11/16 3:24 PM  [facility] [place] [address] [place] [address] [place], [place], [address] Phone: [phone]   Fax:  [phone]  Name: Randalyn Ahmed MRN: [id number] Date of Birth: [birth year]

## 2016-07-14 ENCOUNTER — Encounter: Payer: BLUE CROSS/BLUE SHIELD | Admitting: Physical Therapy

## 2016-07-18 ENCOUNTER — Ambulatory Visit (INDEPENDENT_AMBULATORY_CARE_PROVIDER_SITE_OTHER): Payer: BLUE CROSS/BLUE SHIELD | Admitting: Physical Therapy

## 2016-07-18 DIAGNOSIS — M6281 Muscle weakness (generalized): Secondary | ICD-10-CM | POA: Diagnosis not present

## 2016-07-18 DIAGNOSIS — M25612 Stiffness of left shoulder, not elsewhere classified: Secondary | ICD-10-CM

## 2016-07-18 DIAGNOSIS — R293 Abnormal posture: Secondary | ICD-10-CM | POA: Diagnosis not present

## 2016-07-18 NOTE — Therapy (Signed)
[facility] [address] [place] [address] [place], [place], [address] Phone: [phone]   Fax:  [phone]  Physical Therapy Treatment  Patient Details  Name: Brianna Riley Dutter MRN: [id number] Date of Birth: [birth year] Referring Provider: Dr. Ave Filterhandler  Encounter Date: 07/18/2016      PT End of Session - 07/18/16 1405    Visit Number 6   Number of Visits 12   Date for PT Re-Evaluation 08/08/16   PT Start Time 1403   PT Stop Time 1450   PT Time Calculation (min) 47 min   Activity Tolerance Patient tolerated treatment well   Behavior During Therapy [facility] for tasks assessed/performed      Past Medical History:  Diagnosis Date  . Allergic rhinitis, cause unspecified   . Dyspnea   . Fatty liver   . GERD (gastroesophageal reflux disease)   . Hyperlipidemia   . Hypertension, essential, benign   . Insomnia   . Knee pain, bilateral   . Lumbago   . Migraine   . Mild depression (HCC)   . Nonspecific abnormal results of liver function study   . Obesity   . Osteoarthritis   . Perimenopausal     Past Surgical History:  Procedure Laterality Date  . CARDIAC CATHETERIZATION  09/2006   Clean, Dr. Jens Somrenshaw  . CATARACT EXTRACTION W/ INTRAOCULAR LENS IMPLANT  2002   Right and Left  . DOBUTAMINE STRESS ECHO  08/2008  . LAPAROSCOPY ABDOMEN DIAGNOSTIC    . TOTAL SHOULDER ARTHROPLASTY Left 04/21/2016   Procedure: LEFT TOTAL SHOULDER ARTHROPLASTY;  Surgeon: Jones BroomJustin Chandler, MD;  Location: [facility];  Service: Orthopedics;  Laterality: Left;  LEFT TOTAL SHOULDER ARTHROPLASTY    There were no vitals filed for this visit.      Subjective Assessment - 07/18/16 1405    Subjective Pt reports she was sick last week and she missed her appointment with MD. She will be seeing him this Friday. She reports no new changes with her Lt shoulder.    Currently in Pain? No/denies   Pain Score 0-No pain   Pain Location Shoulder            [facility] PT  Assessment - 07/18/16 0001      Assessment   Medical Diagnosis Lt total shoulder replacement   Referring Provider Dr. Ave Filterhandler   Onset Date/Surgical Date 04/21/16   Hand Dominance Right   Next MD Visit 07-22-16     AROM   Left Shoulder Flexion 115 Degrees  standing, 144 in supine   Left Shoulder ABduction 98 Degrees  standing   Left Shoulder External Rotation 73 Degrees  supine, shoulder abd ~70 deg          [facility] Adult PT Treatment/Exercise - 07/18/16 0001      Shoulder Exercises: Supine   Flexion AAROM;Both;12 reps   Flexion Limitations red band    Other Supine Exercises thoracic lifts x 5 sec hold x 10 reps     Shoulder Exercises: Standing   External Rotation Strengthening;Left   Theraband Level (Shoulder External Rotation) Level 1 (Yellow)  2nd set - red band.    Flexion Strengthening;Left;10 reps;Theraband   Theraband Level (Shoulder Flexion) Level 2 (Red)  rockwood 4    Extension Left;10 reps;Theraband   Theraband Level (Shoulder Extension) Level 2 (Red)   Row Strengthening;Both;10 reps;Theraband  2 sets   Theraband Level (Shoulder Row) Level 2 (Red)   Row Limitations VC for form.    Other Standing Exercises Wall ladder with  LUE x 4 reps. (up to #23); scaption up to #19 x 4 reps      Shoulder Exercises: Pulleys   Flexion 3 minutes   Flexion Limitations VC to slow down and hold stretch.      Shoulder Exercises: Stretch   Other Shoulder Stretches hands behind head pressing elbows into table x 10     Moist Heat Therapy   Number Minutes Moist Heat 15 Minutes   Moist Heat Location Shoulder  LUE, and low back     Electrical Stimulation   Electrical Stimulation Location Lt shoulder    Electrical Stimulation Action IFC   Electrical Stimulation Parameters to tolerance    Electrical Stimulation Goals Pain     Manual Therapy   Myofascial Release Lt pec release, Lt shoulder around incision   Passive ROM Lt shoulder ER.                      PT  Long Term Goals - 07/11/16 1522      PT LONG TERM GOAL #1   Title I with advanced HEP ( 08/08/16)    Time 6   Period Weeks   Status On-going     PT LONG TERM GOAL #2   Title demo Lt shoulder ROM WFL to allow her to reach top shelf and hook her bra ( 08/08/16)    Time 6   Period Weeks   Status On-going     PT LONG TERM GOAL #3   Title demo Lt shoulder strength =/> 4+/5 in all directions ( 08/08/16)    Time 6   Period Weeks   Status On-going     PT LONG TERM GOAL #4   Title verbalize and demonstrate proper upright body mechanics to protect the shoulder joint ( 08/08/16)    Time 6   Period Weeks   Status On-going     PT LONG TERM GOAL #5   Title improve FOTO =/< 40% limited ( 08/08/16)    Time 6   Period Weeks   Status On-going               Plan - 07/18/16 1436    Clinical Impression Statement Pt's incision less point tender than last visit; it has helped that she began self massage at home to this area.  She tolerated all shoulder exercises well, reporting min increase in pain when stretching shoulder past comfort point.    Rehab Potential Good   PT Frequency 2x / week   PT Duration 6 weeks   PT Treatment/Interventions Moist Heat;Ultrasound;Therapeutic exercise;Dry needling;Taping;Vasopneumatic Device;Manual techniques;Neuromuscular re-education;Cryotherapy;Electrical Stimulation;Iontophoresis 4mg /ml Dexamethasone;Patient/family education;Passive range of motion   PT Next Visit Plan Continue progressive ROM, strengthening for Lt shoulder.    Consulted and Agree with Plan of Care Patient      Patient will benefit from skilled therapeutic intervention in order to improve the following deficits and impairments:  Decreased range of motion, Impaired UE functional use, Increased muscle spasms, Decreased knowledge of precautions, Pain, Improper body mechanics, Decreased scar mobility, Increased edema, Decreased strength  Visit Diagnosis: Stiffness of left shoulder, not elsewhere  classified  Muscle weakness (generalized)  Abnormal posture     Problem List Patient Active Problem List   Diagnosis Date Noted  . 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 IFG (impaired fasting glucose) 04/20/2016  . Fatty liver 04/06/2014  . Cervical radiculitis 12/28/2012  . Chronic kidney disease (CKD) stage G3b/A1, moderately decreased glomerular filtration rate (GFR) between 30-44  mL/min/1.73 square meter and albuminuria creatinine ratio less than 30 mg/g 12/04/2012  . Osteoarthritis of both shoulders 02/10/2012  . Right anterior knee pain 02/10/2012  . RETINOPATHY 03/15/2010  . RENAL INSUFFICIENCY 03/15/2010  . 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 FREQUENCY, URINARY 12/08/2009  . ABNORMAL MAMMOGRAM 08/19/2009  . ALLERGIC RHINITIS CAUSE UNSPECIFIED 08/06/2008  . HYPOXEMIA 08/06/2008  . LUMBAGO 05/16/2008  . LIVER FUNCTION TESTS, ABNORMAL 11/07/2007  . DEPRESSION, MILD 03/30/2007  . Hyperlipidemia 10/04/2006  . GERD 10/04/2006  . HYPERTENSION, BENIGN ESSENTIAL 09/05/2006   Mayer Camel, PTA 07/18/16 3:30 PM  [facility] [place] [address] [place] [address] [place], [place], [address] Phone: [phone]   Fax:  [phone]  Name: Makayah Pauli MRN: [id number] Date of Birth: 21-Nov-1955

## 2016-07-21 ENCOUNTER — Encounter: Payer: BLUE CROSS/BLUE SHIELD | Admitting: Physical Therapy

## 2016-07-25 ENCOUNTER — Telehealth: Payer: Self-pay | Admitting: Family Medicine

## 2016-07-25 ENCOUNTER — Ambulatory Visit (INDEPENDENT_AMBULATORY_CARE_PROVIDER_SITE_OTHER): Payer: BLUE CROSS/BLUE SHIELD | Admitting: Family Medicine

## 2016-07-25 ENCOUNTER — Encounter: Payer: Self-pay | Admitting: Physical Therapy

## 2016-07-25 ENCOUNTER — Ambulatory Visit (INDEPENDENT_AMBULATORY_CARE_PROVIDER_SITE_OTHER): Payer: BLUE CROSS/BLUE SHIELD | Admitting: Physical Therapy

## 2016-07-25 DIAGNOSIS — Z1239 Encounter for other screening for malignant neoplasm of breast: Secondary | ICD-10-CM

## 2016-07-25 DIAGNOSIS — Z0000 Encounter for general adult medical examination without abnormal findings: Secondary | ICD-10-CM | POA: Diagnosis not present

## 2016-07-25 DIAGNOSIS — R600 Localized edema: Secondary | ICD-10-CM

## 2016-07-25 DIAGNOSIS — R748 Abnormal levels of other serum enzymes: Secondary | ICD-10-CM | POA: Diagnosis not present

## 2016-07-25 DIAGNOSIS — M25612 Stiffness of left shoulder, not elsewhere classified: Secondary | ICD-10-CM

## 2016-07-25 DIAGNOSIS — B3789 Other sites of candidiasis: Secondary | ICD-10-CM

## 2016-07-25 DIAGNOSIS — M6281 Muscle weakness (generalized): Secondary | ICD-10-CM

## 2016-07-25 DIAGNOSIS — Z1231 Encounter for screening mammogram for malignant neoplasm of breast: Secondary | ICD-10-CM | POA: Diagnosis not present

## 2016-07-25 DIAGNOSIS — R293 Abnormal posture: Secondary | ICD-10-CM | POA: Diagnosis not present

## 2016-07-25 MED ORDER — ATORVASTATIN CALCIUM 80 MG PO TABS
80.0000 mg | ORAL_TABLET | Freq: Every day | ORAL | 3 refills | Status: DC
Start: 2016-07-25 — End: 2016-10-24

## 2016-07-25 MED ORDER — NYSTATIN 100000 UNIT/GM EX POWD: Freq: Three times a day (TID) | CUTANEOUS | 99 refills | Status: DC

## 2016-07-25 NOTE — Patient Instructions (Signed)
Over Head Pull: Narrow Grip     [place] [phone]   On back, knees bent, feet flat, band across thighs, elbows straight but relaxed. Pull hands apart (start). Keeping elbows straight, bring arms up and over head, hands toward floor. Keep pull steady on band. Hold momentarily. Return slowly, keeping pull steady, back to start. Repeat _2-3x10__ times. Band color __red____   Side Pull: Double Arm   On back, knees bent, feet flat. Arms perpendicular to body, shoulder level, elbows straight but relaxed. Pull arms out to sides, elbows straight. Resistance band comes across collarbones, hands toward floor. Hold momentarily. Slowly return to starting position. Repeat _2-3x10__ times. Band color _red____   Elisabeth CaraSash   On back, knees bent, feet flat, left hand on left hip, right hand above left. Pull right arm DIAGONALLY (hip to shoulder) across chest. Bring right arm along head toward floor. Hold momentarily. Slowly return to starting position. Repeat _2-3x10__ times. Do with left arm. Band color _yellow_____   Shoulder Rotation: Double Arm   On back, knees bent, feet flat, elbows tucked at sides, bent 90, hands palms up. Pull hands apart and down toward floor, keeping elbows near sides. Hold momentarily. Slowly return to starting position. Repeat _2-3x10__ times. Band color __red____ . Once a day

## 2016-07-25 NOTE — Therapy (Signed)
[place], [place], [address] Phone: [phone]   Fax:  [phone]  Physical Therapy Treatment  Patient Details  Name: Brianna Riley MRN: [id number] Date of Birth: Dec 18, 1955 Referring Provider: Dr Brianna Riley  Encounter Date: 07/25/2016      PT End of Session - 07/25/16 1407    Visit Number 7   Number of Visits 12   Date for PT Re-Evaluation 08/08/16   PT Start Time 1407   PT Stop Time 1449   PT Time Calculation (min) 42 min   Activity Tolerance Patient tolerated treatment well      Past Medical History:  Diagnosis Date  . Allergic rhinitis, cause unspecified   . Dyspnea   . Fatty liver   . GERD (gastroesophageal reflux disease)   . Hyperlipidemia   . Hypertension, essential, benign   . Insomnia   . Knee pain, bilateral   . Lumbago   . Migraine   . Mild depression ([place])   . Nonspecific abnormal results of liver function study   . Obesity   . Osteoarthritis   . Perimenopausal     Past Surgical History:  Procedure Laterality Date  . CARDIAC CATHETERIZATION  09/2006   Clean, Dr. Stanford Breed  . CATARACT EXTRACTION W/ INTRAOCULAR LENS IMPLANT  2002   Right and Left  . DOBUTAMINE STRESS ECHO  08/2008  . LAPAROSCOPY ABDOMEN DIAGNOSTIC    . TOTAL SHOULDER ARTHROPLASTY Left 04/21/2016   Procedure: LEFT TOTAL SHOULDER ARTHROPLASTY;  Surgeon: Tania Ade, MD;  Location: [place];  Service: Orthopedics;  Laterality: Left;  LEFT TOTAL SHOULDER ARTHROPLASTY    There were no vitals filed for this visit.      Subjective Assessment - 07/25/16 1416    Subjective Brianna Riley said she missed last Thursdays appointment, couldn't get out of bed.  Feels weak   Currently in Pain? No/denies            [facility] PT Assessment - 07/25/16 0001      Assessment   Medical Diagnosis Lt total shoulder replacement   Referring Provider Dr Brianna Riley   Next MD Visit 08/03/16     ROM / Strength   AROM / PROM  / Strength Strength     Strength   Strength Assessment Site Shoulder   Right/Left Shoulder Left   Left Shoulder Flexion 3/5   Left Shoulder ABduction 4/5   Left Shoulder Internal Rotation 4+/5   Left Shoulder External Rotation 4-/5                     [facility] Adult PT Treatment/Exercise - 07/25/16 0001      Shoulder Exercises: Supine   Other Supine Exercises 2x10 red band, overhead pull, horizontal abd, ER, yellow band SASH  pain in levator area with SASH - oes away with rest.      Shoulder Exercises: Sidelying   External Rotation Strengthening;Left;20 reps;Weights   External Rotation Weight (lbs) 1     Shoulder Exercises: ROM/Strengthening   UBE (Upper Arm Bike) L2x4' alt FWD/BWD     Modalities   Modalities Electrical Stimulation;Moist Heat     Moist Heat Therapy   Number Minutes Moist Heat 15 Minutes   Moist Heat Location Shoulder  Lt and low back.      Theme park manager IFC   Electrical Stimulation Parameters to tolerance   Electrical Stimulation Goals Pain  PT Long Term Goals - 07/25/16 1419      PT LONG TERM GOAL #1   Title I with advanced HEP ( 08/08/16)    Status On-going     PT LONG TERM GOAL #2   Title demo Lt shoulder ROM WFL to allow her to reach top shelf and hook her bra ( 08/08/16)    Status On-going  partially met, able to reach top shelf, not bra     PT LONG TERM GOAL #3   Title demo Lt shoulder strength =/> 4+/5 in all directions ( 08/08/16)    Status Partially Met     PT LONG TERM GOAL #4   Title verbalize and demonstrate proper upright body mechanics to protect the shoulder joint ( 08/08/16)    Status On-going     PT LONG TERM GOAL #5   Title improve FOTO =/< 40% limited ( 08/08/16)    Status On-going               Plan - 07/25/16 1435    Clinical Impression Statement Brianna Riley'Brianna progress has been slightly slow,  she has not attended all scheduled appointments.  Her ROM is improving as is her strength, weakest in shoulder flexion.  She has partially met a goal and progressing to the others.    Rehab Potential Good   PT Frequency 2x / week   PT Duration 6 weeks   PT Treatment/Interventions Moist Heat;Ultrasound;Therapeutic exercise;Dry needling;Taping;Vasopneumatic Device;Manual techniques;Neuromuscular re-education;Cryotherapy;Electrical Stimulation;Iontophoresis 24m/ml Dexamethasone;Patient/family education;Passive range of motion   PT Next Visit Plan Continue progressive ROM, strengthening for Lt shoulder.    Consulted and Agree with Plan of Care Patient      Patient will benefit from skilled therapeutic intervention in order to improve the following deficits and impairments:  Decreased range of motion, Impaired UE functional use, Increased muscle spasms, Decreased knowledge of precautions, Pain, Improper body mechanics, Decreased scar mobility, Increased edema, Decreased strength  Visit Diagnosis: Stiffness of left shoulder, not elsewhere classified  Muscle weakness (generalized)  Abnormal posture  Localized edema     Problem List Patient Active Problem List   Diagnosis Date Noted  . Brianna/P shoulder replacement, left 04/21/2016  . IFG (impaired fasting glucose) 04/20/2016  . Fatty liver 04/06/2014  . Cervical radiculitis 12/28/2012  . Chronic kidney disease (CKD) stage G3b/A1, moderately decreased glomerular filtration rate (GFR) between 30-44 mL/min/1.73 square meter and albuminuria creatinine ratio less than 30 mg/g 12/04/2012  . Osteoarthritis of both shoulders 02/10/2012  . Right anterior knee pain 02/10/2012  . RETINOPATHY 03/15/2010  . RENAL INSUFFICIENCY 03/15/2010  . 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 FREQUENCY, URINARY 12/08/2009  . ABNORMAL MAMMOGRAM 08/19/2009  . ALLERGIC RHINITIS CAUSE UNSPECIFIED 08/06/2008  . HYPOXEMIA 08/06/2008  . LUMBAGO  05/16/2008  . LIVER FUNCTION TESTS, ABNORMAL 11/07/2007  . DEPRESSION, MILD 03/30/2007  . Hyperlipidemia 10/04/2006  . GERD 10/04/2006  . HYPERTENSION, BENIGN ESSENTIAL 09/05/2006    SJeral PinchPT  07/25/2016, 2:37 PM  [facility] [address]Phone: [phone]  Fax:  [phone] Name: Brianna RhoadsMRN: [id number] of Birth: [birth year]

## 2016-07-25 NOTE — Telephone Encounter (Signed)
[facility] has record from July 2009. They will fax report.

## 2016-07-25 NOTE — Progress Notes (Signed)
Subjective:     Brianna Riley is a 61 y.o. female and is here for a comprehensive physical exam. The patient reports no problems.  Social History   Social History  . Marital status: Married    Spouse name: N/A  . Number of children: N/A  . Years of education: N/A   Occupational History  . UNEMPLOYED Unemployed  . Retired    Social History Main Topics  . Smoking status: Former Smoker    Quit date: 06/06/1996  . Smokeless tobacco: Not on file  . Alcohol use No     Comment: quit two years ago  . Drug use: No  . Sexual activity: No   Other Topics Concern  . 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 HIV Screening  02/15/1972  . COLONOSCOPY  06/06/2017  . MAMMOGRAM  06/24/2017  . PAP SMEAR  03/09/2020  . TETANUS/TDAP  01/26/2025  . INFLUENZA VACCINE  Completed  . Hepatitis C Screening  Completed    The following portions of the patient's history were reviewed and updated as appropriate: allergies, current medications, past family history, past medical history, past social history, past surgical history and problem list.  Review of Systems A comprehensive review of systems was negative.   Objective:    There were no vitals taken for this visit. General appearance: alert, cooperative and appears stated age Head: Normocephalic, without obvious abnormality, atraumatic Eyes: conj clear, EOMI, PEERLA Ears: normal TM's and external ear canals both ears Nose: Nares normal. Septum midline. Mucosa normal. No drainage or sinus tenderness. Throat: lips, mucosa, and tongue normal; teeth and gums normal Neck: no adenopathy, no carotid bruit, no JVD, supple, symmetrical, trachea midline and thyroid not enlarged, symmetric, no tenderness/mass/nodules Back: symmetric,  no curvature. ROM normal. No CVA tenderness. Lungs: clear to auscultation bilaterally Breasts: normal appearance, no masses or tenderness, she does have an erythematous rash moisture under both breasts.  Heart: regular rate and rhythm, S1, S2 normal, no murmur, click, rub or gallop Abdomen: soft, non-tender; bowel sounds normal; no masses,  no organomegaly Extremities: extremities normal, atraumatic, no cyanosis or edema Pulses: 2+ and symmetric Skin: Skin color, texture, turgor normal. No rashes or lesions Lymph nodes: Cervical, supraclavicular, and axillary nodes normal. Neurologic: Alert and oriented X 3, normal strength and tone. Normal symmetric reflexes. Normal coordination and gait    Assessment:    Healthy female exam.      Plan:     See After Visit Summary for Counseling Recommendations   Keep up a regular exercise program and make sure you are eating a healthy diet Try to eat 4 servings of dairy a day, or if you are lactose intolerant take a calcium with vitamin D daily.  Your vaccines are up to date.  Colonoscopy, mammogram, Pap smear, tetanus vaccine, hepatitis C screening, flu vaccine are all up-to-date.  Yeast under breasts - will tx with nystatin powder.

## 2016-07-25 NOTE — Patient Instructions (Signed)
Keep up a regular exercise program and make sure you are eating a healthy diet Try to eat 4 servings of dairy a day, or if you are lactose intolerant take a calcium with vitamin D daily.  Your vaccines are up to date.   

## 2016-07-25 NOTE — Telephone Encounter (Signed)
Please call [facility]. Patient says she had colonoscopy done around 2009. If they don't have record then lets try [facility] or [facility]. She was seeing Dr. Cathey EndowBowen at that time.   Nani Gasseratherine Gregg Holster, MD

## 2016-07-26 LAB — COMPLETE METABOLIC PANEL WITH GFR
ALBUMIN: 3.8 g/dL (ref 3.6–5.1)
ALK PHOS: 143 U/L — AB (ref 33–130)
ALT: 70 U/L — AB (ref 6–29)
AST: 80 U/L — AB (ref 10–35)
BILIRUBIN TOTAL: 0.2 mg/dL (ref 0.2–1.2)
BUN: 25 mg/dL (ref 7–25)
CO2: 26 mmol/L (ref 20–31)
CREATININE: 1 mg/dL (ref 0.50–1.05)
Calcium: 9.5 mg/dL (ref 8.6–10.4)
Chloride: 110 mmol/L (ref 98–110)
GFR, Est African American: 71 mL/min (ref >=60)
GFR, Est Non African American: 62 mL/min (ref >=60)
GLUCOSE: 90 mg/dL (ref 65–99)
Potassium: 4.5 mmol/L (ref 3.5–5.3)
SODIUM: 143 mmol/L (ref 135–146)
TOTAL PROTEIN: 6.5 g/dL (ref 6.1–8.1)

## 2016-07-26 LAB — LIPID PANEL W/REFLEX DIRECT LDL
Cholesterol: 176 mg/dL (ref <200)
HDL: 71 mg/dL (ref >50)
LDL-CHOLESTEROL: 76 mg/dL
Non-HDL Cholesterol (Calc): 105 mg/dL (ref <130)
Total CHOL/HDL Ratio: 2.5 Ratio (ref <5.0)
Triglycerides: 197 mg/dL — ABNORMAL HIGH (ref <150)

## 2016-07-26 LAB — HEMOGLOBIN A1C
HEMOGLOBIN A1C: 5.3 % (ref <5.7)
MEAN PLASMA GLUCOSE: 105 mg/dL

## 2016-07-26 NOTE — Addendum Note (Signed)
Addended by: Deno EtienneBARKLEY, Dawnya Grams L on: 07/26/2016 06:05 PM   Modules accepted: Orders

## 2016-07-28 ENCOUNTER — Encounter: Payer: Self-pay | Admitting: Physical Therapy

## 2016-07-28 ENCOUNTER — Ambulatory Visit (INDEPENDENT_AMBULATORY_CARE_PROVIDER_SITE_OTHER): Payer: BLUE CROSS/BLUE SHIELD | Admitting: Physical Therapy

## 2016-07-28 ENCOUNTER — Ambulatory Visit (INDEPENDENT_AMBULATORY_CARE_PROVIDER_SITE_OTHER): Payer: BLUE CROSS/BLUE SHIELD | Admitting: Sports Medicine

## 2016-07-28 DIAGNOSIS — M25612 Stiffness of left shoulder, not elsewhere classified: Secondary | ICD-10-CM

## 2016-07-28 DIAGNOSIS — R293 Abnormal posture: Secondary | ICD-10-CM

## 2016-07-28 DIAGNOSIS — R600 Localized edema: Secondary | ICD-10-CM | POA: Diagnosis not present

## 2016-07-28 DIAGNOSIS — M19011 Primary osteoarthritis, right shoulder: Secondary | ICD-10-CM | POA: Diagnosis not present

## 2016-07-28 DIAGNOSIS — M6281 Muscle weakness (generalized): Secondary | ICD-10-CM

## 2016-07-28 NOTE — Assessment & Plan Note (Signed)
Glenohumeral injection as above, return as needed.

## 2016-07-28 NOTE — Progress Notes (Signed)
  Subjective:    CC: Right shoulder pain  HPI: Brianna Riley returns, she just had her left total shoulder arthroplasty and is doing well. She has known arthritis in the right shoulder as well, desires repeat injection today. Pain is moderate, persistent, localized at the joint line. No radiation.  Past medical history:  Negative.  See flowsheet/record as well for more information.  Surgical history: Negative.  See flowsheet/record as well for more information.  Family history: Negative.  See flowsheet/record as well for more information.  Social history: Negative.  See flowsheet/record as well for more information.  Allergies, and medications have been entered into the medical record, reviewed, and no changes needed.   Review of Systems: No fevers, chills, night sweats, weight loss, chest pain, or shortness of breath.   Objective:    General: Well Developed, well nourished, and in no acute distress.  Neuro: Alert and oriented x3, extra-ocular muscles intact, sensation grossly intact.  HEENT: Normocephalic, atraumatic, pupils equal round reactive to light, neck supple, no masses, no lymphadenopathy, thyroid nonpalpable.  Skin: Warm and dry, no rashes. Cardiac: Regular rate and rhythm, no murmurs rubs or gallops, no lower extremity edema.  Respiratory: Clear to auscultation bilaterally. Not using accessory muscles, speaking in full sentences.  Procedure: Real-time Ultrasound Guided Injection of right glenohumeral joint Device: GE Logiq E  Verbal informed consent obtained.  Time-out conducted.  Noted no overlying erythema, induration, or other signs of local infection.  Skin prepped in a sterile fashion.  Local anesthesia: Topical Ethyl chloride.  With sterile technique and under real time ultrasound guidance:  22-gauge spinal needle advanced into the glenohumeral joint, 1 mL kenalog 40, 2 mL lidocaine, 2 mL Marcaine injected easily. Completed without difficulty  Pain immediately resolved  suggesting accurate placement of the medication.  Advised to call if fevers/chills, erythema, induration, drainage, or persistent bleeding.  Images permanently stored and available for review in the ultrasound unit.  Impression: Technically successful ultrasound guided injection.  Impression and Recommendations:    Primary osteoarthritis of right shoulder, post left total shoulder arthroplasty Glenohumeral injection as above, return as needed.  I spent 25 minutes with this patient, greater than 50% was face-to-face time counseling regarding the above diagnoses, this was separate from the time spent performing the above procedure

## 2016-07-28 NOTE — Therapy (Signed)
[place], [place], [address] Phone: [phone]   Fax:  [phone]  Physical Therapy Treatment  Patient Details  Name: Brianna Riley MRN: [id number] Date of Birth: June 10, 1955 Referring Provider: Dr Tamera Punt  Encounter Date: 07/28/2016      PT End of Session - 07/28/16 1435    Visit Number 8   Number of Visits 12   Date for PT Re-Evaluation 08/08/16   PT Start Time 1435   PT Stop Time 1522   PT Time Calculation (min) 47 min   Activity Tolerance Patient tolerated treatment well      Past Medical History:  Diagnosis Date  . Allergic rhinitis, cause unspecified   . Dyspnea   . Fatty liver   . GERD (gastroesophageal reflux disease)   . Hyperlipidemia   . Hypertension, essential, benign   . Insomnia   . Knee pain, bilateral   . Lumbago   . Migraine   . Mild depression ([place])   . Nonspecific abnormal results of liver function study   . Obesity   . Osteoarthritis   . Perimenopausal     Past Surgical History:  Procedure Laterality Date  . CARDIAC CATHETERIZATION  09/2006   [place], Dr. Stanford Breed  . CATARACT EXTRACTION W/ INTRAOCULAR LENS IMPLANT  2002   Right and Left  . DOBUTAMINE STRESS ECHO  08/2008  . LAPAROSCOPY ABDOMEN DIAGNOSTIC    . TOTAL SHOULDER ARTHROPLASTY Left 04/21/2016   Procedure: LEFT TOTAL SHOULDER ARTHROPLASTY;  Surgeon: Tania Ade, MD;  Location: [facility];  Service: Orthopedics;  Laterality: Left;  LEFT TOTAL SHOULDER ARTHROPLASTY    There were no vitals filed for this visit.      Subjective Assessment - 07/28/16 1435    Subjective Brianna Riley was down the hall earlier today and had an injection in the Rt shoulder due to arthritis as she had her physical today.  All her blood work has come back good.    Patient Stated Goals move her arm.    Currently in Pain? No/denies                         [facility] Adult PT Treatment/Exercise - 07/28/16 0001       Shoulder Exercises: Standing   Flexion Strengthening;Both;20 reps;Weights   Shoulder Flexion Weight (lbs) 1   Other Standing Exercises 20 reps, red band scap retraction , ER    Other Standing Exercises biceps 30 reps, 3#, triceps 30 reps green band     Shoulder Exercises: ROM/Strengthening   UBE (Upper Arm Bike) L2x5' alt FWD/BWD   Other ROM/Strengthening Exercises shoulder arch, moving rings one side to other with it resting on linen barrel, then  with 1.5# on wrist     Modalities   Modalities Electrical Stimulation;Moist Heat     Moist Heat Therapy   Number Minutes Moist Heat 15 Minutes   Moist Heat Location Shoulder     Electrical Stimulation   Electrical Stimulation Location Lt shoulder    Electrical Stimulation Action IFC   Electrical Stimulation Parameters to tolerance   Electrical Stimulation Goals Pain                     PT Long Term Goals - 07/25/16 1419      PT LONG TERM GOAL #1   Title I with advanced HEP ( 08/08/16)    Status On-going     PT LONG TERM GOAL #2  Title demo Lt shoulder ROM WFL to allow her to reach top shelf and hook her bra ( 08/08/16)    Status On-going  partially met, able to reach top shelf, not bra     PT LONG TERM GOAL #3   Title demo Lt shoulder strength =/> 4+/5 in all directions ( 08/08/16)    Status Partially Met     PT LONG TERM GOAL #4   Title verbalize and demonstrate proper upright body mechanics to protect the shoulder joint ( 08/08/16)    Status On-going     PT LONG TERM GOAL #5   Title improve FOTO =/< 40% limited ( 08/08/16)    Status On-going               Plan - 07/28/16 1508    Clinical Impression Statement Brianna Riley has a lot of weakness in her Lt shoulder.  She reports she doesn't do much at home besides watching TV and light cooking.    Rehab Potential Good   PT Frequency 2x / week   PT Duration 6 weeks   PT Treatment/Interventions Moist Heat;Ultrasound;Therapeutic exercise;Dry  needling;Taping;Vasopneumatic Device;Manual techniques;Neuromuscular re-education;Cryotherapy;Electrical Stimulation;Iontophoresis 46m/ml Dexamethasone;Patient/family education;Passive range of motion   PT Next Visit Plan Continue progressive ROM, strengthening for Lt shoulder.    Consulted and Agree with Plan of Care Patient      Patient will benefit from skilled therapeutic intervention in order to improve the following deficits and impairments:  Decreased range of motion, Impaired UE functional use, Increased muscle spasms, Decreased knowledge of precautions, Pain, Improper body mechanics, Decreased scar mobility, Increased edema, Decreased strength  Visit Diagnosis: Stiffness of left shoulder, not elsewhere classified  Muscle weakness (generalized)  Abnormal posture  Localized edema     Problem List Patient Active Problem List   Diagnosis Date Noted  . 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 IFG (impaired fasting glucose) 04/20/2016  . Fatty liver 04/06/2014  . Cervical radiculitis 12/28/2012  . Chronic kidney disease (CKD) stage G3b/A1, moderately decreased glomerular filtration rate (GFR) between 30-44 mL/min/1.73 square meter and albuminuria creatinine ratio less than 30 mg/g 12/04/2012  . Primary osteoarthritis of right shoulder, post left total shoulder arthroplasty 02/10/2012  . Right anterior knee pain 02/10/2012  . RETINOPATHY 03/15/2010  . RENAL INSUFFICIENCY 03/15/2010  . 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 FREQUENCY, URINARY 12/08/2009  . ABNORMAL MAMMOGRAM 08/19/2009  . ALLERGIC RHINITIS CAUSE UNSPECIFIED 08/06/2008  . HYPOXEMIA 08/06/2008  . LUMBAGO 05/16/2008  . LIVER FUNCTION TESTS, ABNORMAL 11/07/2007  . DEPRESSION, MILD 03/30/2007  . Hyperlipidemia 10/04/2006  . GERD 10/04/2006  . HYPERTENSION, BENIGN ESSENTIAL 09/05/2006    SJeral PinchPT  07/28/2016, 3:13 PM  [facility] [address]Phone: [phone]  Fax:  [phone] Name: Brianna MontijoMRN: [id number] of Birth: [birth year]

## 2016-07-31 ENCOUNTER — Encounter: Payer: Self-pay | Admitting: Family Medicine

## 2016-08-01 ENCOUNTER — Encounter: Payer: BLUE CROSS/BLUE SHIELD | Admitting: Physical Therapy

## 2016-08-05 ENCOUNTER — Ambulatory Visit (INDEPENDENT_AMBULATORY_CARE_PROVIDER_SITE_OTHER): Payer: BLUE CROSS/BLUE SHIELD | Admitting: Physical Therapy

## 2016-08-05 DIAGNOSIS — M6281 Muscle weakness (generalized): Secondary | ICD-10-CM | POA: Diagnosis not present

## 2016-08-05 DIAGNOSIS — R600 Localized edema: Secondary | ICD-10-CM

## 2016-08-05 DIAGNOSIS — M25612 Stiffness of left shoulder, not elsewhere classified: Secondary | ICD-10-CM | POA: Diagnosis not present

## 2016-08-05 DIAGNOSIS — R293 Abnormal posture: Secondary | ICD-10-CM

## 2016-08-05 NOTE — Therapy (Addendum)
[place], [place], [address] Phone: [phone]   Fax:  [phone]  Physical Therapy Treatment  Patient Details  Name: Brianna Riley MRN: [id number] Date of Birth: 30-May-1956 Referring Provider: Dr Tamera Punt  Encounter Date: 08/05/2016      PT End of Session - 08/05/16 1512    Visit Number 9   Number of Visits 12   Date for PT Re-Evaluation 08/08/16   PT Start Time 1448   PT Stop Time 1543   PT Time Calculation (min) 55 min   Activity Tolerance Patient tolerated treatment well   Behavior During Therapy [facility] for tasks assessed/performed      Past Medical History:  Diagnosis Date  . Allergic rhinitis, cause unspecified   . Dyspnea   . Fatty liver   . GERD (gastroesophageal reflux disease)   . Hyperlipidemia   . Hypertension, essential, benign   . Insomnia   . Knee pain, bilateral   . Lumbago   . Migraine   . Mild depression ([place])   . Nonspecific abnormal results of liver function study   . Obesity   . Osteoarthritis   . Perimenopausal     Past Surgical History:  Procedure Laterality Date  . CARDIAC CATHETERIZATION  09/2006   [place], Dr. Stanford Breed  . CATARACT EXTRACTION W/ INTRAOCULAR LENS IMPLANT  2002   Right and Left  . DOBUTAMINE STRESS ECHO  08/2008  . LAPAROSCOPY ABDOMEN DIAGNOSTIC    . TOTAL SHOULDER ARTHROPLASTY Left 04/21/2016   Procedure: LEFT TOTAL SHOULDER ARTHROPLASTY;  Surgeon: Tania Ade, MD;  Location: [facility];  Service: Orthopedics;  Laterality: Left;  LEFT TOTAL SHOULDER ARTHROPLASTY    There were no vitals filed for this visit.      Subjective Assessment - 08/05/16 1532    Subjective Pt reports things are going well at home (with Lt shoulder).  No new changes since last visit.    Currently in Pain? No/denies   Pain Score 0-No pain            [facility] PT Assessment - 08/05/16 0001      AROM   Left Shoulder Extension 42 Degrees   Left Shoulder  Flexion 121 Degrees   Left Shoulder ABduction 125 Degrees          [facility] Adult PT Treatment/Exercise - 08/05/16 0001      Elbow Exercises   Elbow Flexion Strengthening;Left;10 reps  3 lbs.      Shoulder Exercises: Standing   Flexion Strengthening;Both;10 reps;Weights   Shoulder Flexion Weight (lbs) 1   Flexion Limitations VC; for posture, form, to slow down   ABduction Strengthening;Left;10 reps;Weights   Shoulder ABduction Weight (lbs) 1   ABduction Limitations pt reported pain after 2 reps of second set; stopped.  Limited pain free range; VC to stay at 45 deg with good posture.   Row Strengthening;Both;20 reps;Theraband   Theraband Level (Shoulder Row) Level 3 (Green)   Other Standing Exercises Rings on arch mid level Rt to/from Lt x 12 reps, 2 sets, rest breaks as needed.      Shoulder Exercises: Pulleys   Flexion 3 minutes   Flexion Limitations VC to slow down and hold stretch.      Shoulder Exercises: ROM/Strengthening   UBE (Upper Arm Bike) L2x5' alt FWD/BWD     Modalities   Modalities Electrical Stimulation;Moist Heat     Moist Heat Therapy   Number Minutes Moist Heat 15 Minutes   Moist Heat Location  Shoulder     Electrical Stimulation   Electrical Stimulation Location Lt shoulder    Electrical Stimulation Action IFC   Electrical Stimulation Parameters to tolerance    Electrical Stimulation Goals Pain                     PT Long Term Goals - 08/05/16 1457      PT LONG TERM GOAL #1   Title I with advanced HEP ( 08/08/16)    Time 6   Period Weeks   Status On-going     PT LONG TERM GOAL #2   Title demo Lt shoulder ROM WFL to allow her to reach top shelf and hook her bra ( 08/08/16)    Status On-going  able to reach top shelf, not bra.      PT LONG TERM GOAL #3   Title demo Lt shoulder strength =/> 4+/5 in all directions ( 08/08/16)    Time 6   Period Weeks   Status Partially Met     PT LONG TERM GOAL #4   Title verbalize and demonstrate  proper upright body mechanics to protect the shoulder joint ( 08/08/16)    Time 6   Period Days   Status On-going     PT LONG TERM GOAL #5   Title improve FOTO =/< 40% limited ( 08/08/16)    Time 6   Period Weeks   Status On-going               Plan - 08/05/16 1529    Clinical Impression Statement Pt demonstrated gradual improvement in Lt shoulder ROM and functional strength.  Pt required frequent cues on posture and form throughout session. She tolerated exercises with mild increase in pain once arm fatigued.  Pain reduced with use of estim/ MHP.      Rehab Potential Good   PT Frequency 2x / week   PT Duration 6 weeks   PT Treatment/Interventions Moist Heat;Ultrasound;Therapeutic exercise;Dry needling;Taping;Vasopneumatic Device;Manual techniques;Neuromuscular re-education;Cryotherapy;Electrical Stimulation;Iontophoresis 58m/ml Dexamethasone;Patient/family education;Passive range of motion   PT Next Visit Plan Assess progress and goals.  End of POC. FOTO.    Consulted and Agree with Plan of Care Patient      Patient will benefit from skilled therapeutic intervention in order to improve the following deficits and impairments:  Decreased range of motion, Impaired UE functional use, Increased muscle spasms, Decreased knowledge of precautions, Pain, Improper body mechanics, Decreased scar mobility, Increased edema, Decreased strength  Visit Diagnosis: Stiffness of left shoulder, not elsewhere classified  Muscle weakness (generalized)  Abnormal posture  Localized edema     Problem List Patient Active Problem List   Diagnosis Date Noted  . 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 IFG (impaired fasting glucose) 04/20/2016  . Fatty liver 04/06/2014  . Cervical radiculitis 12/28/2012  . Chronic kidney disease (CKD) stage G3b/A1, moderately decreased glomerular filtration rate (GFR) between 30-44 mL/min/1.73 square meter and albuminuria creatinine ratio less than 30 mg/g  12/04/2012  . Primary osteoarthritis of right shoulder, post left total shoulder arthroplasty 02/10/2012  . Right anterior knee pain 02/10/2012  . RETINOPATHY 03/15/2010  . RENAL INSUFFICIENCY 03/15/2010  . 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 FREQUENCY, URINARY 12/08/2009  . ABNORMAL MAMMOGRAM 08/19/2009  . ALLERGIC RHINITIS CAUSE UNSPECIFIED 08/06/2008  . HYPOXEMIA 08/06/2008  . LUMBAGO 05/16/2008  . LIVER FUNCTION TESTS, ABNORMAL 11/07/2007  . DEPRESSION, MILD 03/30/2007  . Hyperlipidemia 10/04/2006  . GERD 10/04/2006  . HYPERTENSION, BENIGN  ESSENTIAL 09/05/2006   Kerin Perna, PTA 08/05/16 3:36 PM  [facility] [place], [place], [address] Phone: [phone]   Fax:  [phone]  Name: Brianna Riley MRN: [id number] Date of Birth: 03/06/1956  PHYSICAL THERAPY DISCHARGE SUMMARY  Visits from Start of Care: 9  Current functional level related to goals / functional outcomes: unknown   Remaining deficits: unknown   Education / Equipment: HEP Plan:                                                    Patient goals were partially met. Patient is being discharged due to not returning since the last visit.  ?????    Pt has been very inconsistent with therapy attendance and has been doing what she wants with HEP.  She no-showed her last couple of appointments.   [name], PT 09/07/16 3:29 PM

## 2016-08-08 ENCOUNTER — Encounter: Payer: BLUE CROSS/BLUE SHIELD | Admitting: Physical Therapy

## 2016-08-19 DIAGNOSIS — M19012 Primary osteoarthritis, left shoulder: Secondary | ICD-10-CM | POA: Diagnosis not present

## 2016-08-31 ENCOUNTER — Encounter: Payer: BLUE CROSS/BLUE SHIELD | Admitting: Rehabilitative and Restorative Service Providers"

## 2016-09-20 ENCOUNTER — Ambulatory Visit: Payer: BLUE CROSS/BLUE SHIELD

## 2016-10-19 ENCOUNTER — Ambulatory Visit: Payer: BLUE CROSS/BLUE SHIELD | Admitting: Rehabilitative and Restorative Service Providers"

## 2016-10-24 ENCOUNTER — Encounter: Payer: Self-pay | Admitting: Family Medicine

## 2016-10-24 ENCOUNTER — Ambulatory Visit (INDEPENDENT_AMBULATORY_CARE_PROVIDER_SITE_OTHER): Payer: BLUE CROSS/BLUE SHIELD | Admitting: Rehabilitative and Restorative Service Providers"

## 2016-10-24 ENCOUNTER — Ambulatory Visit (INDEPENDENT_AMBULATORY_CARE_PROVIDER_SITE_OTHER): Payer: BLUE CROSS/BLUE SHIELD | Admitting: Family Medicine

## 2016-10-24 ENCOUNTER — Ambulatory Visit (INDEPENDENT_AMBULATORY_CARE_PROVIDER_SITE_OTHER): Payer: BLUE CROSS/BLUE SHIELD | Admitting: Sports Medicine

## 2016-10-24 VITALS — BP 135/73 | HR 73 | Ht 64.0 in | Wt 193.0 lb

## 2016-10-24 DIAGNOSIS — K760 Fatty (change of) liver, not elsewhere classified: Secondary | ICD-10-CM

## 2016-10-24 DIAGNOSIS — M6281 Muscle weakness (generalized): Secondary | ICD-10-CM

## 2016-10-24 DIAGNOSIS — M25612 Stiffness of left shoulder, not elsewhere classified: Secondary | ICD-10-CM

## 2016-10-24 DIAGNOSIS — K219 Gastro-esophageal reflux disease without esophagitis: Secondary | ICD-10-CM

## 2016-10-24 DIAGNOSIS — R293 Abnormal posture: Secondary | ICD-10-CM

## 2016-10-24 DIAGNOSIS — I10 Essential (primary) hypertension: Secondary | ICD-10-CM | POA: Diagnosis not present

## 2016-10-24 DIAGNOSIS — Z0000 Encounter for general adult medical examination without abnormal findings: Secondary | ICD-10-CM | POA: Diagnosis not present

## 2016-10-24 DIAGNOSIS — R29898 Other symptoms and signs involving the musculoskeletal system: Secondary | ICD-10-CM | POA: Diagnosis not present

## 2016-10-24 DIAGNOSIS — M19011 Primary osteoarthritis, right shoulder: Secondary | ICD-10-CM | POA: Diagnosis not present

## 2016-10-24 DIAGNOSIS — R748 Abnormal levels of other serum enzymes: Secondary | ICD-10-CM | POA: Diagnosis not present

## 2016-10-24 MED ORDER — PANTOPRAZOLE SODIUM 40 MG PO TBEC
40.0000 mg | DELAYED_RELEASE_TABLET | Freq: Every day | ORAL | 5 refills | Status: DC
Start: 2016-10-24 — End: 2017-10-02

## 2016-10-24 MED ORDER — ATORVASTATIN CALCIUM 80 MG PO TABS: 80.0000 mg | ORAL_TABLET | Freq: Every day | ORAL | 3 refills | Status: DC

## 2016-10-24 MED ORDER — LOSARTAN POTASSIUM 50 MG PO TABS: 50.0000 mg | ORAL_TABLET | Freq: Every day | ORAL | 1 refills | Status: DC

## 2016-10-24 NOTE — Progress Notes (Signed)
  Subjective:    CC: Right shoulder pain  HPI: Brianna Riley returns, she just had her left total shoulder arthroplasty and is doing well. She has known arthritis in the right shoulder as well, desires repeat injection today. Pain is moderate, persistent, localized at the joint line. No radiation.  Previous injection was 3 months ago.  Past medical history:  Negative.  See flowsheet/record as well for more information.  Surgical history: Negative.  See flowsheet/record as well for more information.  Family history: Negative.  See flowsheet/record as well for more information.  Social history: Negative.  See flowsheet/record as well for more information.  Allergies, and medications have been entered into the medical record, reviewed, and no changes needed.   Review of Systems: No fevers, chills, night sweats, weight loss, chest pain, or shortness of breath.   Objective:    General: Well Developed, well nourished, and in no acute distress.  Neuro: Alert and oriented x3, extra-ocular muscles intact, sensation grossly intact.  HEENT: Normocephalic, atraumatic, pupils equal round reactive to light, neck supple, no masses, no lymphadenopathy, thyroid nonpalpable.  Skin: Warm and dry, no rashes. Cardiac: Regular rate and rhythm, no murmurs rubs or gallops, no lower extremity edema.  Respiratory: Clear to auscultation bilaterally. Not using accessory muscles, speaking in full sentences.  Procedure: Real-time Ultrasound Guided Injection of right glenohumeral joint Device: GE Logiq E  Verbal informed consent obtained.  Time-out conducted.  Noted no overlying erythema, induration, or other signs of local infection.  Skin prepped in a sterile fashion.  Local anesthesia: Topical Ethyl chloride.  With sterile technique and under real time ultrasound guidance:  22-gauge spinal needle advanced into the glenohumeral joint, 1 mL kenalog 40, 2 mL lidocaine, 2 mL Marcaine injected easily. Completed without  difficulty  Pain immediately resolved suggesting accurate placement of the medication.  Advised to call if fevers/chills, erythema, induration, drainage, or persistent bleeding.  Images permanently stored and available for review in the ultrasound unit.  Impression: Technically successful ultrasound guided injection.  Impression and Recommendations:    Primary osteoarthritis of right shoulder, post left total shoulder arthroplasty Previous injection was 3 months ago, repeat right glenohumeral injection as above.  I spent 25 minutes with this patient, greater than 50% was face-to-face time counseling regarding the above diagnoses, this was separate from the time spent performing the above procedure

## 2016-10-24 NOTE — Patient Instructions (Signed)
Shoulder Blade Squeeze    Rotate shoulders back, then squeeze shoulder blades down and back. Hold 10 sec Repeat _10___ times. Do __several __ sessions per day. Can use swim noodle along back to help with posture   Resisted External Rotation: in Neutral - Bilateral   PALMS UP Sit or stand, tubing in both hands, elbows at sides, bent to 90, forearms forward. Pinch shoulder blades together and rotate forearms out. Keep elbows at sides. Repeat __10__ times per set. Do _2-3___ sets per session. Do _2-3___ sessions per day.   Low Row: Standing   Face anchor, feet shoulder width apart. Palms up, pull arms back, squeezing shoulder blades together. Repeat 10__ times per set. Do 2-3__ sets per session. Do 2-3__ sessions per day   Strengthening: Resisted Extension   Hold tubing in right hand, arm forward. Pull arm back, elbow straight. Repeat _10___ times per set. Do 2-3____ sets per session. Do 2-3____ sessions per day.

## 2016-10-24 NOTE — Assessment & Plan Note (Signed)
Previous injection was 3 months ago, repeat right glenohumeral injection as above.

## 2016-10-24 NOTE — Therapy (Signed)
[facility] [address] [place], [place], [address] Phone: [phone]   Fax:  [phone]  Physical Therapy Evaluation  Patient Details  Name: Brianna Riley MRN: [id number] Date of Birth: 1955-10-14 Referring Provider: Dr Jones BroomJustin Chandler   Encounter Date: 10/24/2016      PT End of Session - 10/24/16 1411    Visit Number 1   Number of Visits 12   Date for PT Re-Evaluation 12/05/16   PT Start Time 1411   PT Stop Time 1509   PT Time Calculation (min) 58 min   Activity Tolerance Patient tolerated treatment well      Past Medical History:  Diagnosis Date  . Allergic rhinitis, cause unspecified   . Dyspnea   . Fatty liver   . GERD (gastroesophageal reflux disease)   . Hyperlipidemia   . Hypertension, essential, benign   . Insomnia   . Knee pain, bilateral   . Lumbago   . Migraine   . Mild depression (HCC)   . Nonspecific abnormal results of liver function study   . Obesity   . Osteoarthritis   . Perimenopausal     Past Surgical History:  Procedure Laterality Date  . CARDIAC CATHETERIZATION  09/2006   Clean, Dr. Jens Somrenshaw  . CATARACT EXTRACTION W/ INTRAOCULAR LENS IMPLANT  2002   Right and Left  . DOBUTAMINE STRESS ECHO  08/2008  . LAPAROSCOPY ABDOMEN DIAGNOSTIC    . TOTAL SHOULDER ARTHROPLASTY Left 04/21/2016   Procedure: LEFT TOTAL SHOULDER ARTHROPLASTY;  Surgeon: Jones BroomJustin Chandler, MD;  Location: [facility];  Service: Orthopedics;  Laterality: Left;  LEFT TOTAL SHOULDER ARTHROPLASTY    There were no vitals filed for this visit.       Subjective Assessment - 10/24/16 1414    Subjective Patient reports Lt shoudler pain for~ 2 years with no known cause. She was treated with several injections which provided temporary relief of symptoms. She underwent Lt TSA 04/21/16. she was seen in PT for 6 visits 1/18 to 2/18 and 2 additional visits 2/18 to 3/18 She reports improvement with therapy. She stopped doing her  exercise at home. She still has stiffness and difficulty with functional activitiy.   Pertinent History Arthritis; Rt shoudler pain    How long can you sit comfortably? no limit   How long can you stand comfortably? no limit   How long can you walk comfortably? no limit   Diagnostic tests xrays    Patient Stated Goals strengthening arms - can't even open a jar   Currently in Pain? No/denies   Pain Score 0-No pain   Pain Location Shoulder   Pain Orientation Left   Pain Type Chronic pain   Aggravating Factors  lifting; reaching up; reaching across body or behind back    Pain Relieving Factors meds; heat             [facility] PT Assessment - 10/24/16 0001      Assessment   Medical Diagnosis Lt TSA    Referring Provider Dr Jones BroomJustin Chandler    Onset Date/Surgical Date 04/21/16   Hand Dominance Right   Next MD Visit MD will schedule    Prior Therapy yes - here      Precautions   Precautions --  Lt TSA     Balance Screen   Has the patient fallen in the past 6 months No   Has the patient had a decrease in activity level because of a fear of falling?  No  Is the patient reluctant to leave their home because of a fear of falling?  No     Prior Function   Level of Independence Independent   Vocation Retired   Leisure cooking; TV; sedentary   has treadmill and total gym at home seldom uses them      Observation/Other Assessments   Focus on Therapeutic Outcomes (FOTO)  48% limitation      Sensation   Additional Comments WNL's per pt report - notes Lt hand feels cold at times      Posture/Postural Control   Posture Comments head forward; shoudlers rounded and elevated; increased thoracic kyphosis; scapulae abducted and rotated along the thoracic wall      AROM   Right/Left Shoulder --  assessed in standing - IR/ER at 80 deg abd    Right Shoulder Extension 41 Degrees   Right Shoulder Flexion 142 Degrees   Right Shoulder ABduction 138 Degrees   Right Shoulder Internal Rotation  18 Degrees   Right Shoulder External Rotation 77 Degrees   Left Shoulder Extension 21 Degrees   Left Shoulder Flexion 82 Degrees   Left Shoulder ABduction 126 Degrees   Left Shoulder Internal Rotation 20 Degrees   Left Shoulder External Rotation 64 Degrees     Strength   Right Shoulder Flexion 4+/5   Right Shoulder Extension 5/5   Right Shoulder ABduction 4+/5   Right Shoulder Internal Rotation 4+/5   Right Shoulder External Rotation 4+/5   Left Shoulder Flexion 3/5   Left Shoulder Extension 4/5   Left Shoulder ABduction 3-/5   Left Shoulder Internal Rotation 4/5   Left Shoulder External Rotation 4/5     Palpation   Palpation comment tenderness and tightness Lt shoulder girdle through pecs; biceps; upper trap; teres; deltoid                    [facility] Adult PT Treatment/Exercise - 10/24/16 0001      Shoulder Exercises: Standing   Extension Strengthening;Both;10 reps;Theraband   Theraband Level (Shoulder Extension) Level 2 (Red)   Row Strengthening;Both;10 reps;Theraband   Theraband Level (Shoulder Row) Level 2 (Red)   Retraction Strengthening;Both;10 reps;Theraband   Theraband Level (Shoulder Retraction) Level 2 (Red)   Other Standing Exercises scap squeeze 10 sec x 10 with swim noodle      Shoulder Exercises: Pulleys   Flexion --  10 sec x 10 reps      Moist Heat Therapy   Number Minutes Moist Heat 20 Minutes   Moist Heat Location Shoulder  Lt     Electrical Stimulation   Electrical Stimulation Location Lt shoudler girdle    Electrical Stimulation Action IFC   Electrical Stimulation Parameters to tolerance   Electrical Stimulation Goals Tone  discomfort                 PT Education - 10/24/16 1444    Education provided Yes   Education Details HEP    Person(s) Educated Patient   Methods Explanation;Demonstration;Tactile cues;Verbal cues;Handout   Comprehension Verbalized understanding;Returned demonstration;Verbal cues required;Tactile cues  required             PT Long Term Goals - 10/24/16 1617      PT LONG TERM GOAL #1   Title Improve scapular stability allowing patient to improve functional use of Lt UE (12/05/16)   Time 6   Period Weeks   Status New     PT LONG TERM GOAL #2   Title Demonstrate Lt shoulder  ROM to allow her to reach to 90 deg to reach into shelf for lightweight items (12/05/16)    Time 6   Period Weeks   Status New     PT LONG TERM GOAL #3   Title Increase Lt shoulder strength =/> 4+/5 in all directions (12/05/16)    Time 6   Period Weeks   Status New     PT LONG TERM GOAL #4   Title Independent in HEP (12/05/16)   Time 6   Period Weeks   Status New     PT LONG TERM GOAL #5   Title improve FOTO =/< 34% limited (12/05/16)    Time 6   Period Weeks   Status New               Plan - 10/24/16 1459    Clinical Impression Statement Brianna Riley is seen for low complexity evaluation of Lt shoudler dysfunction. She has poor posture and alignment; limited ROM/strength Lt UE; decreased functional activity Lt UE. Brianna Riley will benefit form PT to address problems identified and improve functional activity level.       Patient will benefit from skilled therapeutic intervention in order to improve the following deficits and impairments:  Postural dysfunction, Improper body mechanics, Pain, Decreased range of motion, Decreased mobility, Decreased strength, Impaired UE functional use, Decreased activity tolerance  Visit Diagnosis: Stiffness of left shoulder, not elsewhere classified - Plan: PT plan of care cert/re-cert  Muscle weakness (generalized) - Plan: PT plan of care cert/re-cert  Abnormal posture - Plan: PT plan of care cert/re-cert  Other symptoms and signs involving the musculoskeletal system - Plan: PT plan of care cert/re-cert     Problem List Patient Active Problem List   Diagnosis Date Noted  . 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 IFG (impaired fasting glucose) 04/20/2016  .  Fatty liver 04/06/2014  . Cervical radiculitis 12/28/2012  . Chronic kidney disease (CKD) stage G3b/A1, moderately decreased glomerular filtration rate (GFR) between 30-44 mL/min/1.73 square meter and albuminuria creatinine ratio less than 30 mg/g 12/04/2012  . Primary osteoarthritis of right shoulder, post left total shoulder arthroplasty 02/10/2012  . Right anterior knee pain 02/10/2012  . RETINOPATHY 03/15/2010  . RENAL INSUFFICIENCY 03/15/2010  . 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 FREQUENCY, URINARY 12/08/2009  . ABNORMAL MAMMOGRAM 08/19/2009  . ALLERGIC RHINITIS CAUSE UNSPECIFIED 08/06/2008  . HYPOXEMIA 08/06/2008  . LUMBAGO 05/16/2008  . LIVER FUNCTION TESTS, ABNORMAL 11/07/2007  . DEPRESSION, MILD 03/30/2007  . Hyperlipidemia 10/04/2006  . GERD 10/04/2006  . HYPERTENSION, BENIGN ESSENTIAL 09/05/2006    Brianna Riley Rober Minion PT, MPH  10/24/2016, 4:23 PM  [facility] [address] [place], [place], [address] Phone: [phone]   Fax:  [phone]  Name: Brianna Riley MRN: [id number] Date of Birth: 01-Apr-1956

## 2016-10-24 NOTE — Progress Notes (Signed)
Subjective:    Patient ID: Brianna Riley, female    DOB: 03-17-1956, 61 y.o.   MRN: [id number]  HPI Hypertension- Pt denies chest pain, SOB, dizziness, or heart palpitations.  Taking meds as directed w/o problems.  Denies medication side effects.    GERD- She just uses her Protonix as needed doesn't always take it every day but feels like it works well.  Elevated liver enzymes. We checked her liver enzymes in February. They have jumped up to AST of 80 and ALTs 70. Normally she runs in the 40-50 range. I have encouraged her to go to the lab and get these rechecked. She said she did get this morning. She denies taking any Tylenol or alcohol products. She says she needs a pain reliever should usually use Aleve or aspirin.   Review of Systems   BP 135/73   Pulse 73   Ht 5\' 4"  (1.626 m)   Wt 193 lb (87.5 kg)   BMI 33.13 kg/m     Allergies  Allergen Reactions  . Duloxetine Other (See Comments)    Irritable   . Hctz [Hydrochlorothiazide] Other (See Comments)    Caused inc in BUN/CR    Past Medical History:  Diagnosis Date  . Allergic rhinitis, cause unspecified   . Dyspnea   . Fatty liver   . GERD (gastroesophageal reflux disease)   . Hyperlipidemia   . Hypertension, essential, benign   . Insomnia   . Knee pain, bilateral   . Lumbago   . Migraine   . Mild depression (HCC)   . Nonspecific abnormal results of liver function study   . Obesity   . Osteoarthritis   . Perimenopausal     Past Surgical History:  Procedure Laterality Date  . CARDIAC CATHETERIZATION  09/2006   Clean, Dr. Jens Som  . CATARACT EXTRACTION W/ INTRAOCULAR LENS IMPLANT  2002   Right and Left  . DOBUTAMINE STRESS ECHO  08/2008  . LAPAROSCOPY ABDOMEN DIAGNOSTIC    . TOTAL SHOULDER ARTHROPLASTY Left 04/21/2016   Procedure: LEFT TOTAL SHOULDER ARTHROPLASTY;  Surgeon: Jones Broom, MD;  Location: [facility];  Service: Orthopedics;  Laterality: Left;  LEFT TOTAL SHOULDER ARTHROPLASTY    Social  History   Social History  . Marital status: Married    Spouse name: N/A  . Number of children: N/A  . Years of education: N/A   Occupational History  . UNEMPLOYED Unemployed  . Retired    Social History Main Topics  . Smoking status: Former Smoker    Quit date: 06/06/1996  . Smokeless tobacco: Never Used  . Alcohol use No     Comment: quit two years ago  . Drug use: No  . Sexual activity: No   Other Topics Concern  . 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 Lung cancer Mother   . Heart attack Father 34  . Drug abuse Brother     Outpatient Encounter Prescriptions as of 10/24/2016  Medication Sig  . aspirin 325 MG tablet Take 650-1,000 mg by mouth See admin instructions. Take 650 mg by mouth in the morning and take 1000 mg by mouth in the evening  . atorvastatin (LIPITOR) 80 MG  tablet Take 1 tablet (80 mg total) by mouth daily.  Marland Kitchen. docusate sodium (COLACE) 100 MG capsule Take 1 capsule (100 mg total) by mouth 3 (three) times daily as needed.  . fish oil-omega-3 fatty acids 1000 MG capsule Take 1 capsule by mouth daily.    . Flaxseed, Linseed, (FLAX SEED OIL) 1000 MG CAPS Take 1,000 mg by mouth daily.   . Garcinia Cambogia-Chromium 500-200 MG-MCG TABS Take 1 tablet by mouth daily.   . Garlic Oil 500 MG CAPS Take 161500 mg by mouth daily.   Marland Kitchen. losartan (COZAAR) 50 MG tablet Take 1 tablet (50 mg total) by mouth daily.  Marland Kitchen. MELATONIN PO Take 1 tablet by mouth daily.   . milk thistle 175 MG tablet Take 175 mg by mouth daily.  . Multiple Vitamins-Iron (MULTIVITAMIN/IRON) TABS Take 1 tablet by mouth daily.    . niacin (SLO-NIACIN) 500 MG tablet Take 500 mg by mouth at bedtime.  . pantoprazole (PROTONIX) 40 MG tablet Take 1 tablet (40 mg total) by mouth daily.   . [DISCONTINUED] atorvastatin (LIPITOR) 80 MG tablet Take 1 tablet (80 mg total) by mouth daily.  . [DISCONTINUED] losartan (COZAAR) 50 MG tablet Take 1 tablet (50 mg total) by mouth daily.  . [DISCONTINUED] pantoprazole (PROTONIX) 40 MG tablet Take 1 tablet (40 mg total) by mouth daily.  . traMADol (ULTRAM) 50 MG tablet take 1 to 2 tablets by mouth every 8 hours (MAX 6 TABLETS PER DAY) (Patient not taking: Reported on 10/24/2016)  . [DISCONTINUED] oxyCODONE-acetaminophen (ROXICET) 5-325 MG tablet Take 1-2 tablets by mouth every 4 (four) hours as needed for severe pain.   No facility-administered encounter medications on file as of 10/24/2016.           Objective:   Physical Exam  Constitutional: She is oriented to person, place, and time. She appears well-developed and well-nourished.  HENT:  Head: Normocephalic and atraumatic.  Cardiovascular: Normal rate, regular rhythm and normal heart sounds.   Pulmonary/Chest: Effort normal and breath sounds normal.  Neurological: She is alert and oriented to person, place, and time.  Skin: Skin is warm and dry.  Psychiatric: She has a normal mood and affect. Her behavior is normal.          Assessment & Plan:  HTN - Well controlled. Continue current regimen. Follow up in  6 months.   Elevated liver enzymes/fatty liver-we'll await lab results. Hopefully her liver enzymes went back down to baseline.    GERD-continue when necessary use of Protonix.

## 2016-10-25 LAB — COMPREHENSIVE METABOLIC PANEL
ALT: 54 U/L — ABNORMAL HIGH (ref 6–29)
AST: 45 U/L — ABNORMAL HIGH (ref 10–35)
Albumin: 3.8 g/dL (ref 3.6–5.1)
Alkaline Phosphatase: 114 U/L (ref 33–130)
BUN: 26 mg/dL — ABNORMAL HIGH (ref 7–25)
CALCIUM: 9.4 mg/dL (ref 8.6–10.4)
CO2: 28 mmol/L (ref 20–31)
Chloride: 104 mmol/L (ref 98–110)
Creat: 1.05 mg/dL (ref 0.50–1.05)
GLUCOSE: 80 mg/dL (ref 65–99)
POTASSIUM: 4.7 mmol/L (ref 3.5–5.3)
Sodium: 142 mmol/L (ref 135–146)
Total Bilirubin: 0.3 mg/dL (ref 0.2–1.2)
Total Protein: 6.3 g/dL (ref 6.1–8.1)

## 2016-10-25 LAB — HEPATIC FUNCTION PANEL
ALK PHOS: 114 U/L (ref 33–130)
ALT: 54 U/L — AB (ref 6–29)
AST: 46 U/L — ABNORMAL HIGH (ref 10–35)
Albumin: 3.8 g/dL (ref 3.6–5.1)
BILIRUBIN INDIRECT: 0.2 mg/dL (ref 0.2–1.2)
Bilirubin, Direct: 0.1 mg/dL (ref <=0.2)
TOTAL PROTEIN: 6.4 g/dL (ref 6.1–8.1)
Total Bilirubin: 0.3 mg/dL (ref 0.2–1.2)

## 2016-10-26 ENCOUNTER — Ambulatory Visit: Payer: BLUE CROSS/BLUE SHIELD

## 2016-10-27 ENCOUNTER — Encounter: Payer: BLUE CROSS/BLUE SHIELD | Admitting: Rehabilitative and Restorative Service Providers"

## 2016-11-02 ENCOUNTER — Ambulatory Visit (INDEPENDENT_AMBULATORY_CARE_PROVIDER_SITE_OTHER): Payer: BLUE CROSS/BLUE SHIELD

## 2016-11-02 DIAGNOSIS — Z1231 Encounter for screening mammogram for malignant neoplasm of breast: Secondary | ICD-10-CM | POA: Diagnosis not present

## 2016-11-02 DIAGNOSIS — Z1239 Encounter for other screening for malignant neoplasm of breast: Secondary | ICD-10-CM

## 2016-11-03 ENCOUNTER — Ambulatory Visit (INDEPENDENT_AMBULATORY_CARE_PROVIDER_SITE_OTHER): Payer: BLUE CROSS/BLUE SHIELD | Admitting: Physical Therapy

## 2016-11-03 ENCOUNTER — Encounter: Payer: Self-pay | Admitting: Physical Therapy

## 2016-11-03 DIAGNOSIS — M6281 Muscle weakness (generalized): Secondary | ICD-10-CM | POA: Diagnosis not present

## 2016-11-03 DIAGNOSIS — R293 Abnormal posture: Secondary | ICD-10-CM | POA: Diagnosis not present

## 2016-11-03 DIAGNOSIS — M25612 Stiffness of left shoulder, not elsewhere classified: Secondary | ICD-10-CM

## 2016-11-03 DIAGNOSIS — R29898 Other symptoms and signs involving the musculoskeletal system: Secondary | ICD-10-CM | POA: Diagnosis not present

## 2016-11-03 NOTE — Therapy (Signed)
[place], [place], [address] Phone: [phone]   Fax:  [phone]  Physical Therapy Treatment  Patient Details  Name: Brianna Riley MRN: [id number] Date of Birth: 08-10-1955 Referring Provider: Dr Tania Ade   Encounter Date: 11/03/2016      PT End of Session - 11/03/16 1405    Visit Number 2   Number of Visits 12   Date for PT Re-Evaluation 12/05/16   PT Start Time 9937   PT Stop Time 1500   PT Time Calculation (min) 55 min   Activity Tolerance Patient tolerated treatment well      Past Medical History:  Diagnosis Date  . Allergic rhinitis, cause unspecified   . Dyspnea   . Fatty liver   . GERD (gastroesophageal reflux disease)   . Hyperlipidemia   . Hypertension, essential, benign   . Insomnia   . Knee pain, bilateral   . Lumbago   . Migraine   . Mild depression ([place])   . Nonspecific abnormal results of liver function study   . Obesity   . Osteoarthritis   . Perimenopausal     Past Surgical History:  Procedure Laterality Date  . CARDIAC CATHETERIZATION  09/2006   Clean, Dr. Stanford Breed  . CATARACT EXTRACTION W/ INTRAOCULAR LENS IMPLANT  2002   Right and Left  . DOBUTAMINE STRESS ECHO  08/2008  . LAPAROSCOPY ABDOMEN DIAGNOSTIC    . TOTAL SHOULDER ARTHROPLASTY Left 04/21/2016   Procedure: LEFT TOTAL SHOULDER ARTHROPLASTY;  Surgeon: Tania Ade, MD;  Location: [facility];  Service: Orthopedics;  Laterality: Left;  LEFT TOTAL SHOULDER ARTHROPLASTY    There were no vitals filed for this visit.      Subjective Assessment - 11/03/16 1406    Subjective Pt reports she is doing her HEP    Patient Stated Goals strengthening arms - can't even open a jar   Currently in Pain? No/denies                         [facility] Adult PT Treatment/Exercise - 11/03/16 0001      Exercises   Exercises Elbow;Shoulder     Shoulder Exercises: Seated   Horizontal ABduction  Strengthening;Both;20 reps;Theraband   Theraband Level (Shoulder Horizontal ABduction) Level 2 (Red)     Shoulder Exercises: Standing   Flexion Both;20 reps  rolling ball up/down wall   Flexion Limitations shoulder ladder, up to # 24.   Other Standing Exercises Lt/Rt UE shoulder arc both directions   Other Standing Exercises 2x8 wall push ups     Shoulder Exercises: Pulleys   Flexion 2 minutes   ABduction 2 minutes  scaption     Shoulder Exercises: ROM/Strengthening   UBE (Upper Arm Bike) L2x4' alt FWD/BWD     Shoulder Exercises: Stretch   Other Shoulder Stretches 30 sec pec stretch bilat      Modalities   Modalities Electrical Stimulation;Moist Heat     Moist Heat Therapy   Number Minutes Moist Heat 20 Minutes   Moist Heat Location Shoulder  Lt     Electrical Stimulation   Electrical Stimulation Location Lt shoudler girdle    Electrical Stimulation Action IFC   Electrical Stimulation Parameters to tolerance   Electrical Stimulation Goals Pain;Tone                     PT Long Term Goals - 11/03/16 1416      PT LONG  TERM GOAL #1   Title Improve scapular stability allowing patient to improve functional use of Lt UE (12/05/16)   Status On-going     PT LONG TERM GOAL #2   Title Demonstrate Lt shoulder ROM to allow her to reach to 90 deg to reach into shelf for lightweight items (12/05/16)    Status On-going     PT LONG TERM GOAL #3   Title Increase Lt shoulder strength =/> 4+/5 in all directions (12/05/16)    Status On-going     PT LONG TERM GOAL #4   Title Independent in HEP (12/05/16)   Status On-going     PT LONG TERM GOAL #5   Title improve FOTO =/< 34% limited (12/05/16)    Status On-going               Plan - 11/03/16 1416    Clinical Impression Statement This is Teri's second visit for this episode of care.  No goals met.  She does have some increased ROM, continues with poor posutre, rounded shoulders and decreased functional use of the Lt  UE.  Her rt UE is imporved after having an injection last week.    Rehab Potential Good   PT Frequency 2x / week   PT Duration 6 weeks   PT Treatment/Interventions Patient/family education;ADLs/Self Care Home Management;Cryotherapy;Electrical Stimulation;Iontophoresis 81m/ml Dexamethasone;Moist Heat;Traction;Ultrasound;Dry needling;Manual techniques;Therapeutic activities;Therapeutic exercise;Neuromuscular re-education   PT Next Visit Plan continue with UE postural correction ex and UE strengthening.    Consulted and Agree with Plan of Care Patient      Patient will benefit from skilled therapeutic intervention in order to improve the following deficits and impairments:  Postural dysfunction, Improper body mechanics, Pain, Decreased range of motion, Decreased mobility, Decreased strength, Impaired UE functional use, Decreased activity tolerance  Visit Diagnosis: Stiffness of left shoulder, not elsewhere classified  Muscle weakness (generalized)  Abnormal posture  Other symptoms and signs involving the musculoskeletal system     Problem List Patient Active Problem List   Diagnosis Date Noted  . 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 IFG (impaired fasting glucose) 04/20/2016  . Fatty liver 04/06/2014  . Cervical radiculitis 12/28/2012  . Chronic kidney disease (CKD) stage G3b/A1, moderately decreased glomerular filtration rate (GFR) between 30-44 mL/min/1.73 square meter and albuminuria creatinine ratio less than 30 mg/g 12/04/2012  . Primary osteoarthritis of right shoulder, post left total shoulder arthroplasty 02/10/2012  . Right anterior knee pain 02/10/2012  . RETINOPATHY 03/15/2010  . RENAL INSUFFICIENCY 03/15/2010  . 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 FREQUENCY, URINARY 12/08/2009  . ABNORMAL MAMMOGRAM 08/19/2009  . ALLERGIC RHINITIS CAUSE UNSPECIFIED 08/06/2008  . HYPOXEMIA 08/06/2008  . LUMBAGO 05/16/2008  . LIVER FUNCTION TESTS, ABNORMAL  11/07/2007  . DEPRESSION, MILD 03/30/2007  . Hyperlipidemia 10/04/2006  . GERD 10/04/2006  . HYPERTENSION, BENIGN ESSENTIAL 09/05/2006    SJeral PinchPT  11/03/2016, 2:42 PM  [facility] [address]Phone: [phone]  Fax:  [phone] Name: Brianna FritzeMRN: [id number] of Birth: 9Aug 18, 1957

## 2016-11-09 ENCOUNTER — Encounter: Payer: Self-pay | Admitting: Rehabilitative and Restorative Service Providers"

## 2016-11-09 ENCOUNTER — Ambulatory Visit (INDEPENDENT_AMBULATORY_CARE_PROVIDER_SITE_OTHER): Payer: BLUE CROSS/BLUE SHIELD | Admitting: Rehabilitative and Restorative Service Providers"

## 2016-11-09 DIAGNOSIS — R29898 Other symptoms and signs involving the musculoskeletal system: Secondary | ICD-10-CM

## 2016-11-09 DIAGNOSIS — M25612 Stiffness of left shoulder, not elsewhere classified: Secondary | ICD-10-CM | POA: Diagnosis not present

## 2016-11-09 DIAGNOSIS — R293 Abnormal posture: Secondary | ICD-10-CM

## 2016-11-09 DIAGNOSIS — M6281 Muscle weakness (generalized): Secondary | ICD-10-CM | POA: Diagnosis not present

## 2016-11-09 NOTE — Therapy (Signed)
[facility] [place] [address] [place], [place], [address] Phone: [phone]   Fax:  [phone]  Physical Therapy Treatment  Patient Details  Name: Brianna Riley MRN: [id number] Date of Birth: 09/05/1955 Referring Provider: Dr Jones BroomJustin Chandler   Encounter Date: 11/09/2016      PT End of Session - 11/09/16 1526    Visit Number 3   Number of Visits 12   Date for PT Re-Evaluation 12/05/16   PT Start Time 1516   PT Stop Time 1612   PT Time Calculation (min) 56 min   Activity Tolerance Patient tolerated treatment well      Past Medical History:  Diagnosis Date  . Allergic rhinitis, cause unspecified   . Dyspnea   . Fatty liver   . GERD (gastroesophageal reflux disease)   . Hyperlipidemia   . Hypertension, essential, benign   . Insomnia   . Knee pain, bilateral   . Lumbago   . Migraine   . Mild depression (HCC)   . Nonspecific abnormal results of liver function study   . Obesity   . Osteoarthritis   . Perimenopausal     Past Surgical History:  Procedure Laterality Date  . CARDIAC CATHETERIZATION  09/2006   Clean, Dr. Jens Somrenshaw  . CATARACT EXTRACTION W/ INTRAOCULAR LENS IMPLANT  2002   Right and Left  . DOBUTAMINE STRESS ECHO  08/2008  . LAPAROSCOPY ABDOMEN DIAGNOSTIC    . TOTAL SHOULDER ARTHROPLASTY Left 04/21/2016   Procedure: LEFT TOTAL SHOULDER ARTHROPLASTY;  Surgeon: Jones BroomJustin Chandler, MD;  Location: [facility];  Service: Orthopedics;  Laterality: Left;  LEFT TOTAL SHOULDER ARTHROPLASTY    There were no vitals filed for this visit.      Subjective Assessment - 11/09/16 1528    Subjective Fell just before she came to therapy. She was standing on her stairs when she turned and fell landing on her butt. Sore from the fall. Shoulder feels OK today.    Currently in Pain? No/denies            [facility] PT Assessment - 11/09/16 0001      Assessment   Medical Diagnosis Lt TSA    Referring Provider Dr Jones BroomJustin  Chandler    Onset Date/Surgical Date 04/21/16   Hand Dominance Right   Next MD Visit MD will schedule    Prior Therapy yes - here      AROM   Right/Left Shoulder --  AROM assessed with pt in sitting    Left Shoulder Extension 48 Degrees   Left Shoulder Flexion 130 Degrees   Left Shoulder ABduction 125 Degrees   Left Shoulder Internal Rotation 29 Degrees  UE supported in 90 deg abd    Left Shoulder External Rotation 70 Degrees  UE supported in 90 deg abd      Palpation   Palpation comment persistent tenderness and tightness Lt shoulder girdle through pecs; biceps; upper trap; teres; deltoid                      [facility] Adult PT Treatment/Exercise - 11/09/16 0001      Shoulder Exercises: Seated   Elevation Strengthening;Left;10 reps   Extension AROM;10 reps;Left   Retraction Strengthening;Both;20 reps;Theraband   Theraband Level (Shoulder Retraction) Level 1 (Yellow)   Row Strengthening;Both;20 reps;Theraband   Theraband Level (Shoulder Row) Level 1 (Yellow)   Horizontal ABduction Strengthening;Both;20 reps   Theraband Level (Shoulder Horizontal ABduction) Level 1 (Yellow)   Flexion AROM;10 reps;Left  Abduction AROM;10 reps;Left  scaption    Other Seated Exercises diagonals sash x 20 each direction yellow TB      Shoulder Exercises: Pulleys   Flexion 2 minutes   ABduction 2 minutes  scaption     Shoulder Exercises: Therapy Ball   Other Therapy Ball Exercises scapular depression sitting with Lt palm on green therapy ball 5 sec x 10; rhythmic stabilazation ~30 sec x 3 hand on ball      Moist Heat Therapy   Number Minutes Moist Heat 15 Minutes   Moist Heat Location Shoulder  Lt     Electrical Stimulation   Electrical Stimulation Location Lt shoudler girdle    Electrical Stimulation Action IFC   Electrical Stimulation Parameters to tolerance   Electrical Stimulation Goals Pain;Tone                     PT Long Term Goals - 11/09/16 1527       PT LONG TERM GOAL #1   Title Improve scapular stability allowing patient to improve functional use of Lt UE (12/05/16)   Time 6   Period Weeks   Status On-going     PT LONG TERM GOAL #2   Title Demonstrate Lt shoulder ROM to allow her to reach to 90 deg to reach into shelf for lightweight items (12/05/16)    Time 6   Period Weeks   Status On-going     PT LONG TERM GOAL #3   Title Increase Lt shoulder strength =/> 4+/5 in all directions (12/05/16)    Time 6   Period Weeks   Status On-going     PT LONG TERM GOAL #4   Title Independent in HEP (12/05/16)   Time 6   Period Weeks   Status On-going     PT LONG TERM GOAL #5   Title improve FOTO =/< 34% limited (12/05/16)    Time 6   Period Weeks   Status On-going               Plan - 11/09/16 1604    Clinical Impression Statement Patient presents with c/o of buttock pain after having fallen on her stairs landing on her buttocks earlier today. She did exercises for her shoulder siting on ice pack for buttocks today. Demonstrated gains in AROM Lt shoulder. Progressing toward stated goals of therapy gradually. Will increase activity and exercise at next visit as tolerated.    Rehab Potential Good   PT Frequency 2x / week   PT Duration 6 weeks   PT Treatment/Interventions Patient/family education;ADLs/Self Care Home Management;Cryotherapy;Electrical Stimulation;Iontophoresis 4mg /ml Dexamethasone;Moist Heat;Traction;Ultrasound;Dry needling;Manual techniques;Therapeutic activities;Therapeutic exercise;Neuromuscular re-education   PT Next Visit Plan continue with UE postural correction ex and UE strengthening - progress as tolerated    Consulted and Agree with Plan of Care Patient      Patient will benefit from skilled therapeutic intervention in order to improve the following deficits and impairments:  Postural dysfunction, Improper body mechanics, Pain, Decreased range of motion, Decreased mobility, Decreased strength, Impaired UE  functional use, Decreased activity tolerance  Visit Diagnosis: Stiffness of left shoulder, not elsewhere classified  Muscle weakness (generalized)  Abnormal posture  Other symptoms and signs involving the musculoskeletal system     Problem List Patient Active Problem List   Diagnosis Date Noted  . 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 IFG (impaired fasting glucose) 04/20/2016  . Fatty liver 04/06/2014  . Cervical radiculitis 12/28/2012  . Chronic kidney disease (CKD)  stage G3b/A1, moderately decreased glomerular filtration rate (GFR) between 30-44 mL/min/1.73 square meter and albuminuria creatinine ratio less than 30 mg/g 12/04/2012  . Primary osteoarthritis of right shoulder, post left total shoulder arthroplasty 02/10/2012  . Right anterior knee pain 02/10/2012  . RETINOPATHY 03/15/2010  . RENAL INSUFFICIENCY 03/15/2010  . 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 FREQUENCY, URINARY 12/08/2009  . ABNORMAL MAMMOGRAM 08/19/2009  . ALLERGIC RHINITIS CAUSE UNSPECIFIED 08/06/2008  . HYPOXEMIA 08/06/2008  . LUMBAGO 05/16/2008  . LIVER FUNCTION TESTS, ABNORMAL 11/07/2007  . DEPRESSION, MILD 03/30/2007  . Hyperlipidemia 10/04/2006  . GERD 10/04/2006  . HYPERTENSION, BENIGN ESSENTIAL 09/05/2006    Celyn Rober Minion PT, MPH  11/09/2016, 4:08 PM  [facility] [address] [place], [place], [address] Phone: [phone]   Fax:  [phone]  Name: Brianna Riley MRN: [id number] Date of Birth: 06-13-1955

## 2016-11-10 IMAGING — US US ABDOMEN COMPLETE
1 series · 14 of 25 positions shown · non-contrast
Comparison: 04/19/2012

CLINICAL DATA: Elevated LFTs, fatty liver

EXAM:
ULTRASOUND ABDOMEN COMPLETE

[Series 1: us abdomen complete · 0.24mm/px · 14 of 85 slices shown]
[im 1/85]
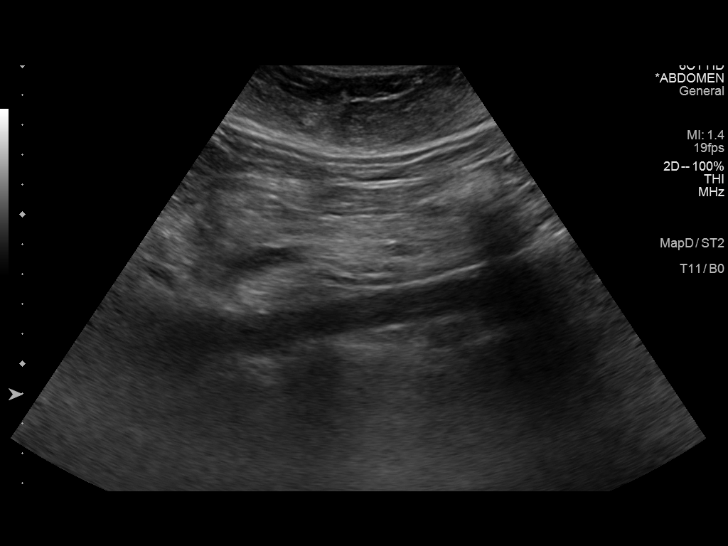
[im 8/85]
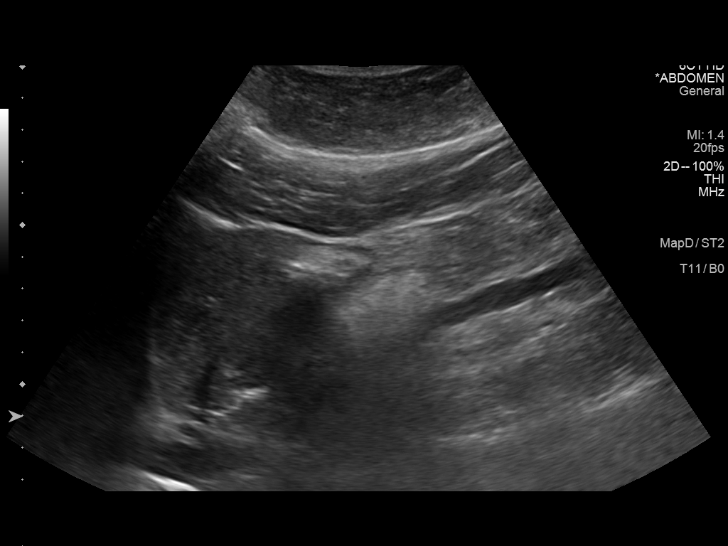
[im 15/85]
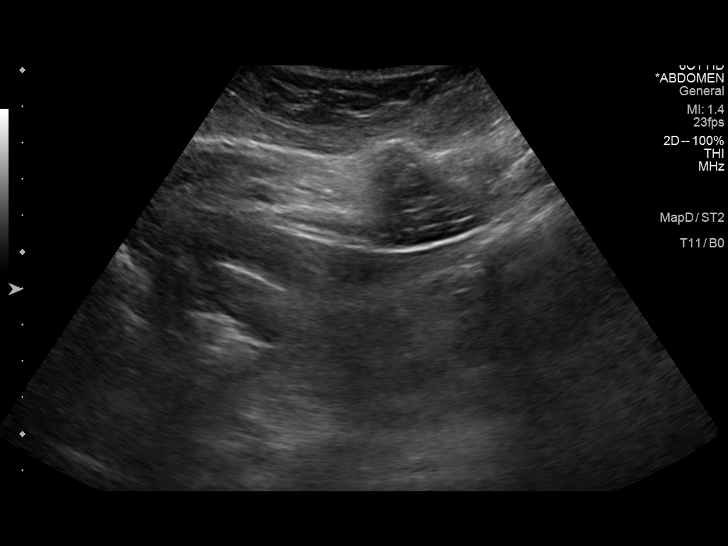
[im 22/85]
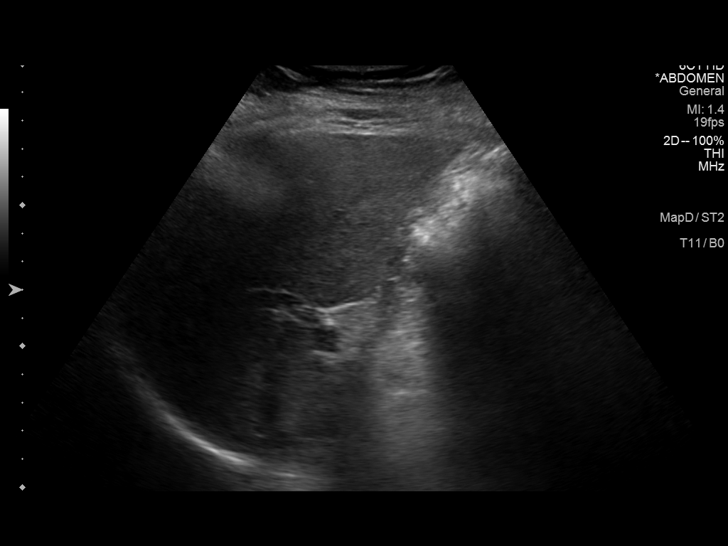
[im 29/85]
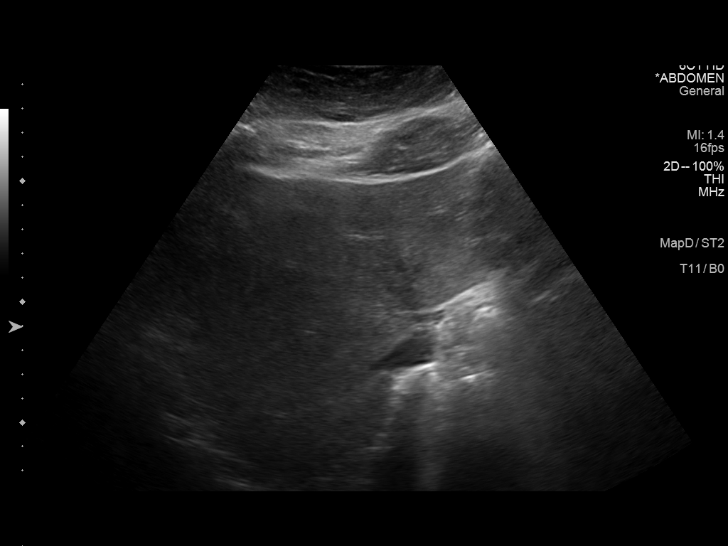
[im 32/85]
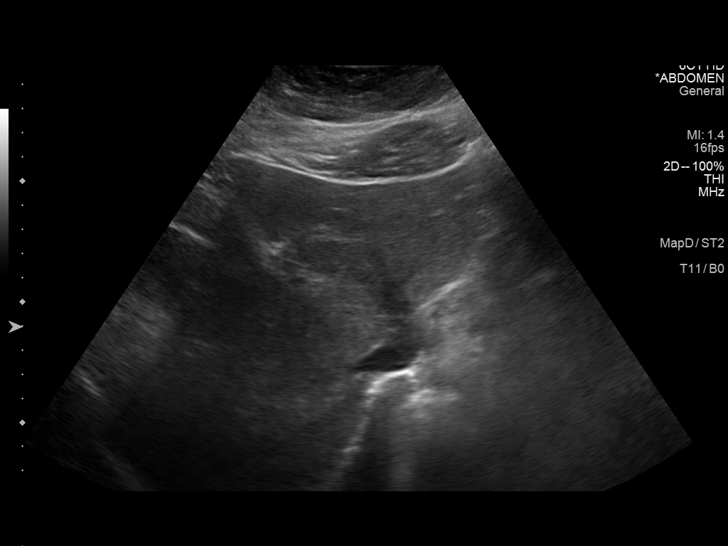
[im 39/85]
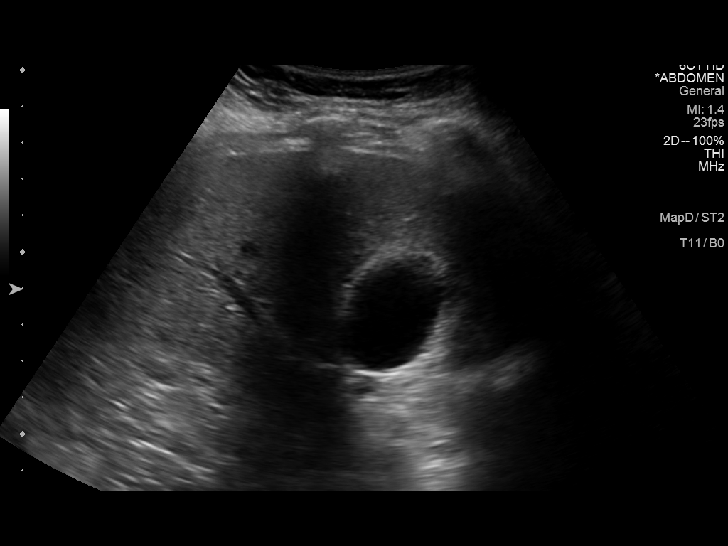
[im 46/85]
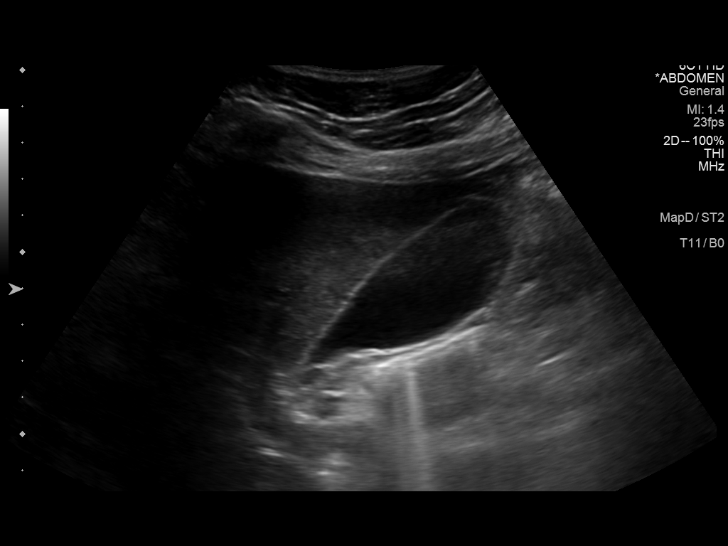
[im 53/85]
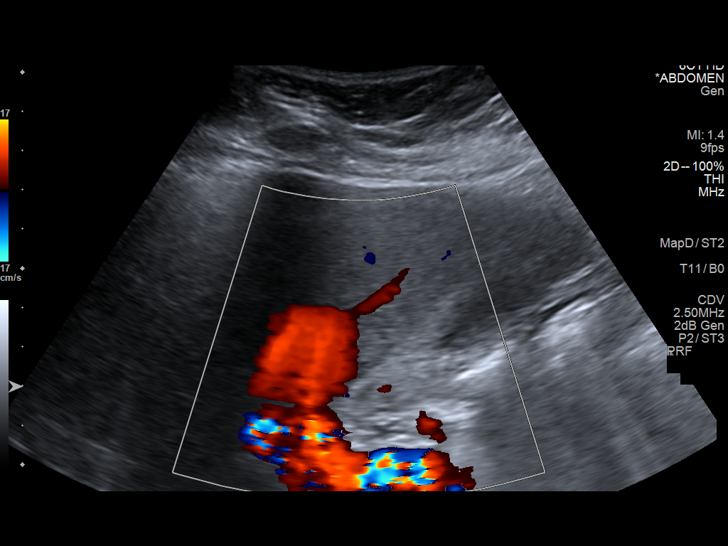
[im 57/85]
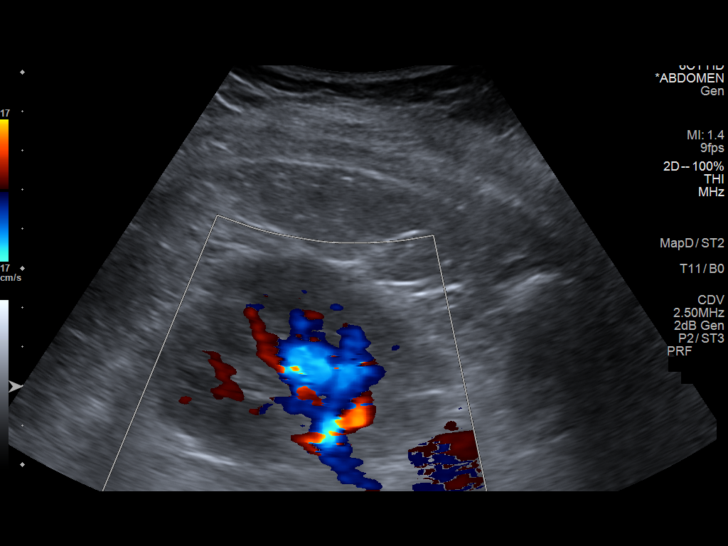
[im 64/85]
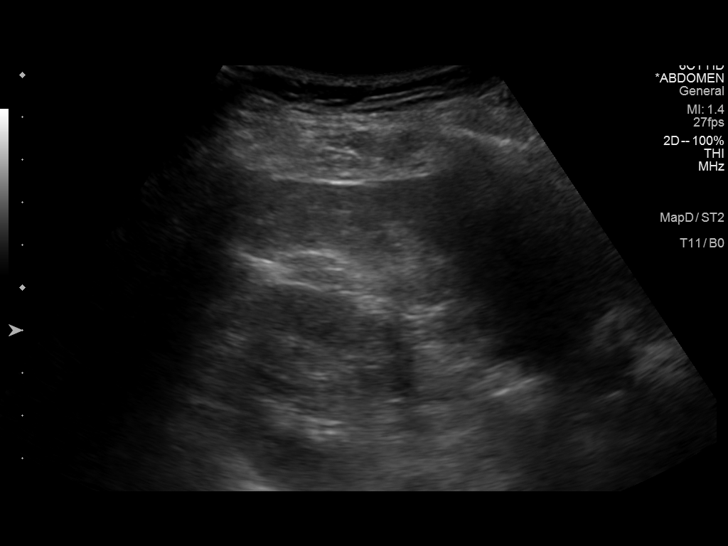
[im 71/85]
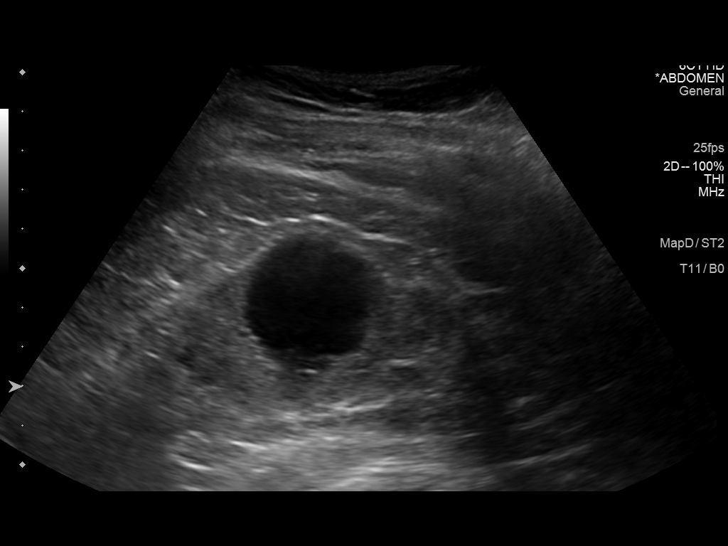
[im 78/85]
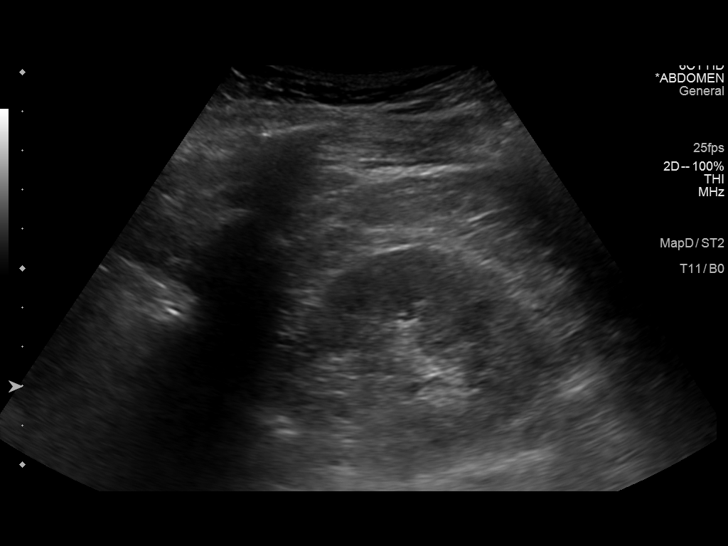
[im 85/85]
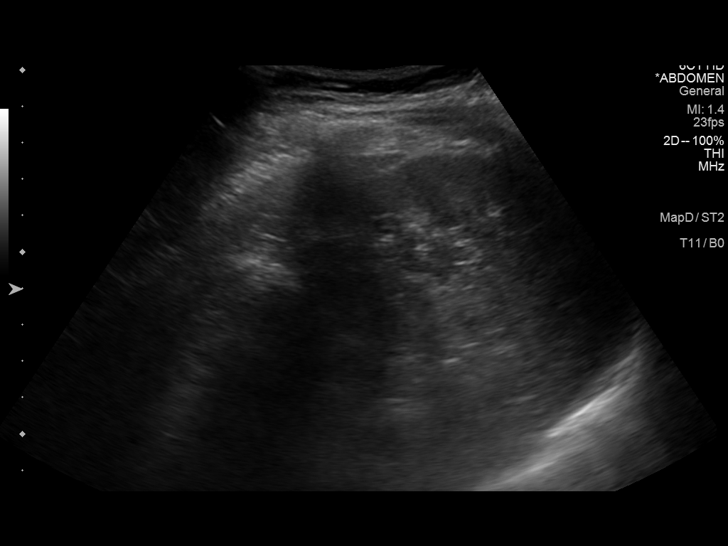

[14 of 25 positions shown; findings below may reference images not displayed]

FINDINGS: Gallbladder: No gallstones, gallbladder wall thickening, or
pericholecystic fluid. Negative sonographic Murphy's sign.

Common bile duct: Diameter: 2 mm

Liver: No focal lesion identified. Hyperechoic hepatic parenchyma,
suggesting hepatic steatosis.

IVC: No abnormality visualized.

Pancreas: Visualized portion unremarkable.

Spleen: Size and appearance within normal limits.

Right Kidney: Length: 9.4 cm. 1.3 x 1.4 x 1.3 cm upper pole cyst. No
hydronephrosis.

Left Kidney: Length: 9.8 cm. 4.5 x 4.0 x 3.8 cm lower pole cyst. No
hydronephrosis.

Abdominal aorta: No aneurysm visualized.

Other findings: None.
IMPRESSION: Suspected hepatic steatosis.

Bilateral renal cysts.

## 2016-11-11 ENCOUNTER — Encounter: Payer: BLUE CROSS/BLUE SHIELD | Admitting: Rehabilitative and Restorative Service Providers"

## 2016-11-14 ENCOUNTER — Ambulatory Visit (INDEPENDENT_AMBULATORY_CARE_PROVIDER_SITE_OTHER): Payer: BLUE CROSS/BLUE SHIELD | Admitting: Physical Therapy

## 2016-11-14 DIAGNOSIS — R29898 Other symptoms and signs involving the musculoskeletal system: Secondary | ICD-10-CM

## 2016-11-14 DIAGNOSIS — M25612 Stiffness of left shoulder, not elsewhere classified: Secondary | ICD-10-CM

## 2016-11-14 DIAGNOSIS — M6281 Muscle weakness (generalized): Secondary | ICD-10-CM

## 2016-11-14 DIAGNOSIS — R293 Abnormal posture: Secondary | ICD-10-CM | POA: Diagnosis not present

## 2016-11-14 NOTE — Therapy (Signed)
[place], [place], [address] Phone: [phone]   Fax:  [phone]  Physical Therapy Treatment  Patient Details  Name: Brianna Riley MRN: [id number] Date of Birth: July 20, 1955 Referring Provider: Dr Fermin Schwab  Encounter Date: 11/14/2016      PT End of Session - 11/14/16 1432    Visit Number 4   Number of Visits 12   Date for PT Re-Evaluation 12/05/16   PT Start Time 1433      Past Medical History:  Diagnosis Date  . Allergic rhinitis, cause unspecified   . Dyspnea   . Fatty liver   . GERD (gastroesophageal reflux disease)   . Hyperlipidemia   . Hypertension, essential, benign   . Insomnia   . Knee pain, bilateral   . Lumbago   . Migraine   . Mild depression ([place])   . Nonspecific abnormal results of liver function study   . Obesity   . Osteoarthritis   . Perimenopausal     Past Surgical History:  Procedure Laterality Date  . CARDIAC CATHETERIZATION  09/2006   Clean, Dr. Stanford Breed  . CATARACT EXTRACTION W/ INTRAOCULAR LENS IMPLANT  2002   Right and Left  . DOBUTAMINE STRESS ECHO  08/2008  . LAPAROSCOPY ABDOMEN DIAGNOSTIC    . TOTAL SHOULDER ARTHROPLASTY Left 04/21/2016   Procedure: LEFT TOTAL SHOULDER ARTHROPLASTY;  Surgeon: Tania Ade, MD;  Location: [facility];  Service: Orthopedics;  Laterality: Left;  LEFT TOTAL SHOULDER ARTHROPLASTY    There were no vitals filed for this visit.      Subjective Assessment - 11/14/16 1433    Subjective Brianna Riley reports she is doing well, recovered from her fall. Still wanting to get her muscles back.    Patient Stated Goals strengthening arms - can't even open a jar   Currently in Pain? No/denies            [facility] PT Assessment - 11/14/16 0001      Assessment   Medical Diagnosis Lt TSA    Referring Provider Dr Fermin Schwab   Onset Date/Surgical Date 04/21/16   Hand Dominance Right   Next MD Visit in November     AROM   Left  Shoulder Extension 48 Degrees   Left Shoulder Flexion 143 Degrees   Left Shoulder ABduction 128 Degrees   Left Shoulder Internal Rotation 20 Degrees  arm abduct 90 degrees seated.    Left Shoulder External Rotation 68 Degrees  arm in 90 degrees abd seated     Strength   Left Shoulder Flexion 4/5   Left Shoulder Extension 4+/5   Left Shoulder ABduction 4-/5   Left Shoulder Internal Rotation 4+/5   Left Shoulder External Rotation 4/5                     [facility] Adult PT Treatment/Exercise - 11/14/16 0001      Shoulder Exercises: Seated   Flexion Strengthening;Both;15 reps;Weights  2 sets, leaning on noodle   Flexion Weight (lbs) 2   Abduction Strengthening;Both;15 reps;Weights  2 sets, scaption   ABduction Weight (lbs) 1     Shoulder Exercises: Standing   Other Standing Exercises Lt UE shoulder arc both directions, with 1# wt on wrist    Other Standing Exercises 3x8 overhead press, 1# each hand.      Shoulder Exercises: Pulleys   Flexion 3 minutes   ABduction 3 minutes     Shoulder Exercises: ROM/Strengthening   UBE (Upper  Arm Bike) L2x4' alt FWD/BWD     Shoulder Exercises: Stretch   Other Shoulder Stretches 3x20 sec upper trap stretch Lt      Modalities   Modalities Electrical Stimulation;Moist Heat     Moist Heat Therapy   Number Minutes Moist Heat 15 Minutes   Moist Heat Location Shoulder     Electrical Stimulation   Electrical Stimulation Location Lt shoulder   Electrical Stimulation Action IFC   Electrical Stimulation Parameters to tolerance   Electrical Stimulation Goals Pain                     PT Long Term Goals - 11/14/16 1435      PT LONG TERM GOAL #1   Title Improve scapular stability allowing patient to improve functional use of Lt UE (12/05/16)   Status On-going     PT LONG TERM GOAL #2   Title Demonstrate Lt shoulder ROM to allow her to reach to 90 deg to reach into shelf for lightweight items (12/05/16)    Status Achieved      PT LONG TERM GOAL #3   Title Increase Lt shoulder strength =/> 4+/5 in all directions (12/05/16)    Status Partially Met     PT LONG TERM GOAL #4   Title Independent in HEP (12/05/16)   Status On-going     PT LONG TERM GOAL #5   Title improve FOTO =/< 34% limited (12/05/16)    Status On-going               Plan - 11/14/16 1446    Clinical Impression Statement Brianna Riley is making slow progress, she is not real compliant with formal HEP however she is able to use the Lt UE for more functional tasks, ie opening/closing mircrowave and assist with cooking and lifting items.  Strength and ROM continue to improve, met a goal and partially met another. Still needs strengthening of the Lt shoulder and posterior scapular muscles.    Rehab Potential Good   PT Frequency 2x / week   PT Duration 6 weeks   PT Treatment/Interventions Patient/family education;ADLs/Self Care Home Management;Cryotherapy;Electrical Stimulation;Iontophoresis 55m/ml Dexamethasone;Moist Heat;Traction;Ultrasound;Dry needling;Manual techniques;Therapeutic activities;Therapeutic exercise;Neuromuscular re-education   PT Next Visit Plan functional strengthenig Lt UE    Consulted and Agree with Plan of Care Patient      Patient will benefit from skilled therapeutic intervention in order to improve the following deficits and impairments:  Postural dysfunction, Improper body mechanics, Pain, Decreased range of motion, Decreased mobility, Decreased strength, Impaired UE functional use, Decreased activity tolerance  Visit Diagnosis: Stiffness of left shoulder, not elsewhere classified  Muscle weakness (generalized)  Abnormal posture  Other symptoms and signs involving the musculoskeletal system     Problem List Patient Active Problem List   Diagnosis Date Noted  . 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 IFG (impaired fasting glucose) 04/20/2016  . Fatty liver 04/06/2014  . Cervical radiculitis 12/28/2012  .  Chronic kidney disease (CKD) stage G3b/A1, moderately decreased glomerular filtration rate (GFR) between 30-44 mL/min/1.73 square meter and albuminuria creatinine ratio less than 30 mg/g 12/04/2012  . Primary osteoarthritis of right shoulder, post left total shoulder arthroplasty 02/10/2012  . Right anterior knee pain 02/10/2012  . RETINOPATHY 03/15/2010  . RENAL INSUFFICIENCY 03/15/2010  . 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 FREQUENCY, URINARY 12/08/2009  . ABNORMAL MAMMOGRAM 08/19/2009  . ALLERGIC RHINITIS CAUSE UNSPECIFIED 08/06/2008  . HYPOXEMIA 08/06/2008  . LUMBAGO 05/16/2008  . LIVER  FUNCTION TESTS, ABNORMAL 11/07/2007  . DEPRESSION, MILD 03/30/2007  . Hyperlipidemia 10/04/2006  . GERD 10/04/2006  . HYPERTENSION, BENIGN ESSENTIAL 09/05/2006    Boneta Lucks rPT 11/14/2016, 3:05 PM  [facility] [place], [place], [address] Phone: [phone]   Fax:  [phone]  Name: Brianna Riley MRN: [id number] Date of Birth: [birth year]

## 2016-11-16 ENCOUNTER — Ambulatory Visit (INDEPENDENT_AMBULATORY_CARE_PROVIDER_SITE_OTHER): Payer: BLUE CROSS/BLUE SHIELD | Admitting: Physical Therapy

## 2016-11-16 ENCOUNTER — Encounter: Payer: Self-pay | Admitting: Physical Therapy

## 2016-11-16 DIAGNOSIS — R29898 Other symptoms and signs involving the musculoskeletal system: Secondary | ICD-10-CM | POA: Diagnosis not present

## 2016-11-16 DIAGNOSIS — M25612 Stiffness of left shoulder, not elsewhere classified: Secondary | ICD-10-CM | POA: Diagnosis not present

## 2016-11-16 DIAGNOSIS — M6281 Muscle weakness (generalized): Secondary | ICD-10-CM

## 2016-11-16 DIAGNOSIS — R293 Abnormal posture: Secondary | ICD-10-CM

## 2016-11-16 NOTE — Therapy (Addendum)
[place], [place], [address] Phone: [phone]   Fax:  [phone]  Physical Therapy Treatment  Patient Details  Name: Brianna Riley MRN: [id number] Date of Birth: February 03, 1956 Referring Provider: Dr Fermin Schwab  Encounter Date: 11/16/2016      PT End of Session - 11/16/16 1507    Visit Number 5   Number of Visits 12   Date for PT Re-Evaluation 12/05/16   PT Start Time 3244   PT Stop Time 1526   PT Time Calculation (min) 54 min   Activity Tolerance Patient limited by pain      Past Medical History:  Diagnosis Date  . Allergic rhinitis, cause unspecified   . Dyspnea   . Fatty liver   . GERD (gastroesophageal reflux disease)   . Hyperlipidemia   . Hypertension, essential, benign   . Insomnia   . Knee pain, bilateral   . Lumbago   . Migraine   . Mild depression ([place])   . Nonspecific abnormal results of liver function study   . Obesity   . Osteoarthritis   . Perimenopausal     Past Surgical History:  Procedure Laterality Date  . CARDIAC CATHETERIZATION  09/2006   Clean, Dr. Stanford Breed  . CATARACT EXTRACTION W/ INTRAOCULAR LENS IMPLANT  2002   Right and Left  . DOBUTAMINE STRESS ECHO  08/2008  . LAPAROSCOPY ABDOMEN DIAGNOSTIC    . TOTAL SHOULDER ARTHROPLASTY Left 04/21/2016   Procedure: LEFT TOTAL SHOULDER ARTHROPLASTY;  Surgeon: Tania Ade, MD;  Location: [facility];  Service: Orthopedics;  Laterality: Left;  LEFT TOTAL SHOULDER ARTHROPLASTY    There were no vitals filed for this visit.      Subjective Assessment - 11/16/16 1434    Subjective terri reports she is having lateral Lt arm pain today.    Patient Stated Goals strengthening arms - can't even open a jar   Currently in Pain? Yes   Pain Score 5    Pain Location Shoulder   Pain Orientation Left;Lateral   Pain Descriptors / Indicators Throbbing   Pain Type Chronic pain   Pain Onset More than a month ago   Pain  Frequency Intermittent   Aggravating Factors  not sure, started after last visit   Pain Relieving Factors meds and heat                         [facility] Adult PT Treatment/Exercise - 11/16/16 0001      Elbow Exercises   Elbow Extension Strengthening;Both;Supine  30 reps, cues for form   Other elbow exercises modified lat pull downs with 1# wts, VC for form   Other elbow exercises 30 reps bicep curls, 2# wts     Shoulder Exercises: ROM/Strengthening   UBE (Upper Arm Bike) L2x4' alt FWD/BWD     Modalities   Modalities Electrical Stimulation;Moist Heat;Ultrasound     Moist Heat Therapy   Number Minutes Moist Heat 15 Minutes   Moist Heat Location Shoulder     Electrical Stimulation   Electrical Stimulation Location Lt shoulder   Electrical Stimulation Action IFC   Electrical Stimulation Parameters to tolerance   Electrical Stimulation Goals Pain;Tone     Ultrasound   Ultrasound Location Lt deltoid   Ultrasound Parameters 100%, 1.47mz, 1.5 w/cm2   Ultrasound Goals Pain  tone                     PT  Long Term Goals - 11/14/16 1435      PT LONG TERM GOAL #1   Title Improve scapular stability allowing patient to improve functional use of Lt UE (12/05/16)   Status On-going     PT LONG TERM GOAL #2   Title Demonstrate Lt shoulder ROM to allow her to reach to 90 deg to reach into shelf for lightweight items (12/05/16)    Status Achieved     PT LONG TERM GOAL #3   Title Increase Lt shoulder strength =/> 4+/5 in all directions (12/05/16)    Status Partially Met     PT LONG TERM GOAL #4   Title Independent in HEP (12/05/16)   Status On-going     PT LONG TERM GOAL #5   Title improve FOTO =/< 34% limited (12/05/16)    Status On-going               Plan - 11/16/16 1454    Clinical Impression Statement Pt is sore from the strengthening at last appointment. Had increased tightness in the Lt deltoid, with palpable trigger points.    Rehab Potential  Good   PT Frequency 2x / week   PT Treatment/Interventions Patient/family education;ADLs/Self Care Home Management;Cryotherapy;Electrical Stimulation;Iontophoresis 28m/ml Dexamethasone;Moist Heat;Traction;Ultrasound;Dry needling;Manual techniques;Therapeutic activities;Therapeutic exercise;Neuromuscular re-education   PT Next Visit Plan manual work to LT deltoid if still tight and functional Lt UE strengthening   Consulted and Agree with Plan of Care Patient      Patient will benefit from skilled therapeutic intervention in order to improve the following deficits and impairments:  Postural dysfunction, Improper body mechanics, Pain, Decreased range of motion, Decreased mobility, Decreased strength, Impaired UE functional use, Decreased activity tolerance  Visit Diagnosis: Stiffness of left shoulder, not elsewhere classified  Muscle weakness (generalized)  Abnormal posture  Other symptoms and signs involving the musculoskeletal system     Problem List Patient Active Problem List   Diagnosis Date Noted  . 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 IFG (impaired fasting glucose) 04/20/2016  . Fatty liver 04/06/2014  . Cervical radiculitis 12/28/2012  . Chronic kidney disease (CKD) stage G3b/A1, moderately decreased glomerular filtration rate (GFR) between 30-44 mL/min/1.73 square meter and albuminuria creatinine ratio less than 30 mg/g 12/04/2012  . Primary osteoarthritis of right shoulder, post left total shoulder arthroplasty 02/10/2012  . Right anterior knee pain 02/10/2012  . RETINOPATHY 03/15/2010  . RENAL INSUFFICIENCY 03/15/2010  . 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 FREQUENCY, URINARY 12/08/2009  . ABNORMAL MAMMOGRAM 08/19/2009  . ALLERGIC RHINITIS CAUSE UNSPECIFIED 08/06/2008  . HYPOXEMIA 08/06/2008  . LUMBAGO 05/16/2008  . LIVER FUNCTION TESTS, ABNORMAL 11/07/2007  . DEPRESSION, MILD 03/30/2007  . Hyperlipidemia 10/04/2006  . GERD 10/04/2006   . HYPERTENSION, BENIGN ESSENTIAL 09/05/2006    SJeral PinchPT  11/16/2016, 3:08 PM  [facility] [address] [place] [address]Phone: [phone]  Fax:  [phone] Name: Brianna WisenbakerMRN: [id number] of Birth: 902/02/1956  PHYSICAL THERAPY DISCHARGE SUMMARY  Visits from Start of Care: 5 Current functional level related to goals / functional outcomes: unknown   Remaining deficits: unknown   Education / Equipment: HEP Plan:  Patient goals were partially met. Patient is being discharged due to not returning since the last visit.  ?????Pt has a history of no showing for therapy and then self discharging.     [name], PT 01/09/17 8:28 AM

## 2016-11-23 ENCOUNTER — Encounter: Payer: BLUE CROSS/BLUE SHIELD | Admitting: Physical Therapy

## 2016-11-24 IMAGING — DX DG FOOT COMPLETE 3+V*R*
3 series · 3 of 3 positions shown · non-contrast
Comparison: None

CLINICAL DATA: RIGHT foot and ankle pain and tenderness after
falling 2 days ago

EXAM:
RIGHT FOOT COMPLETE - 3+ VIEW

[foot ap]
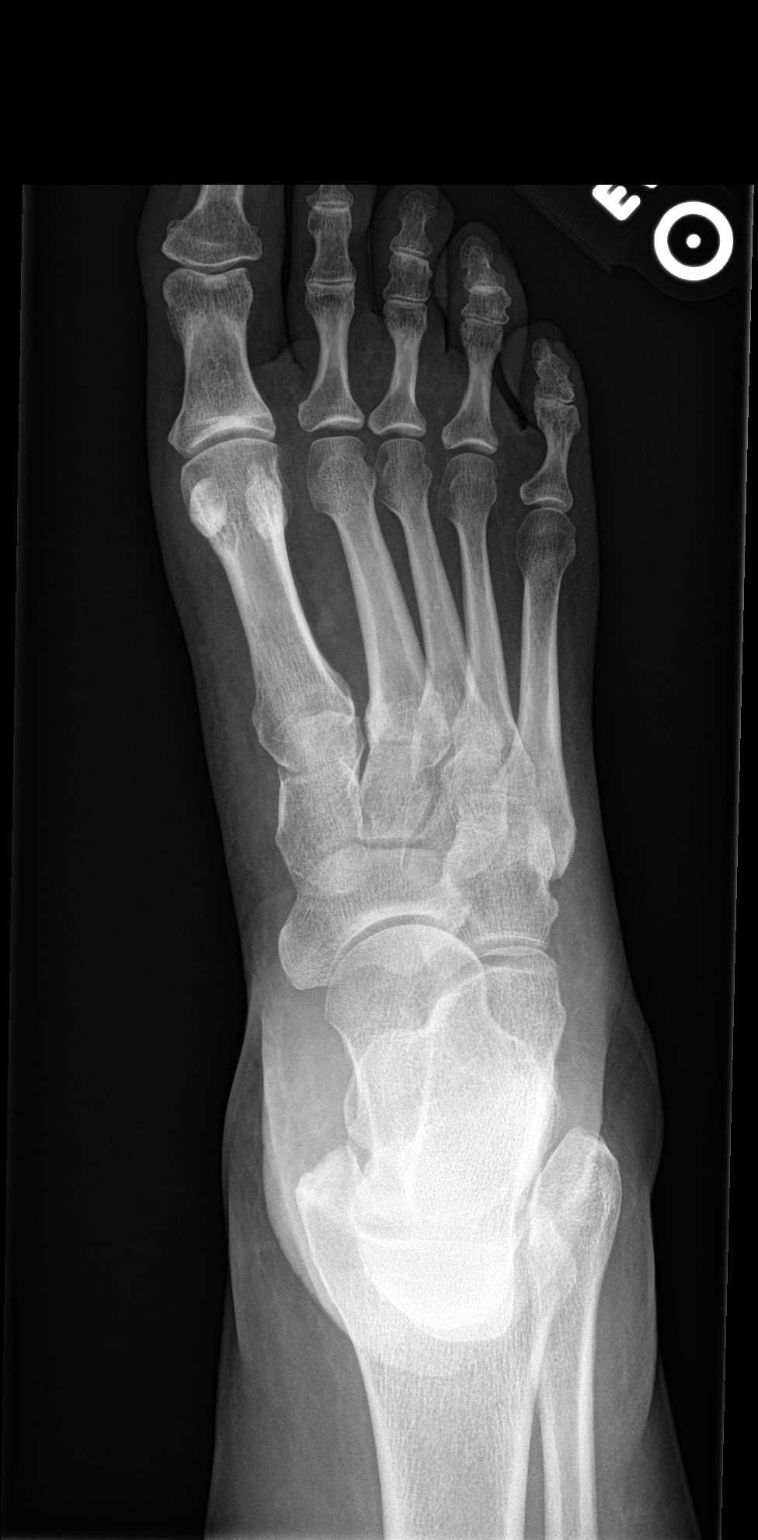

[foot obl]
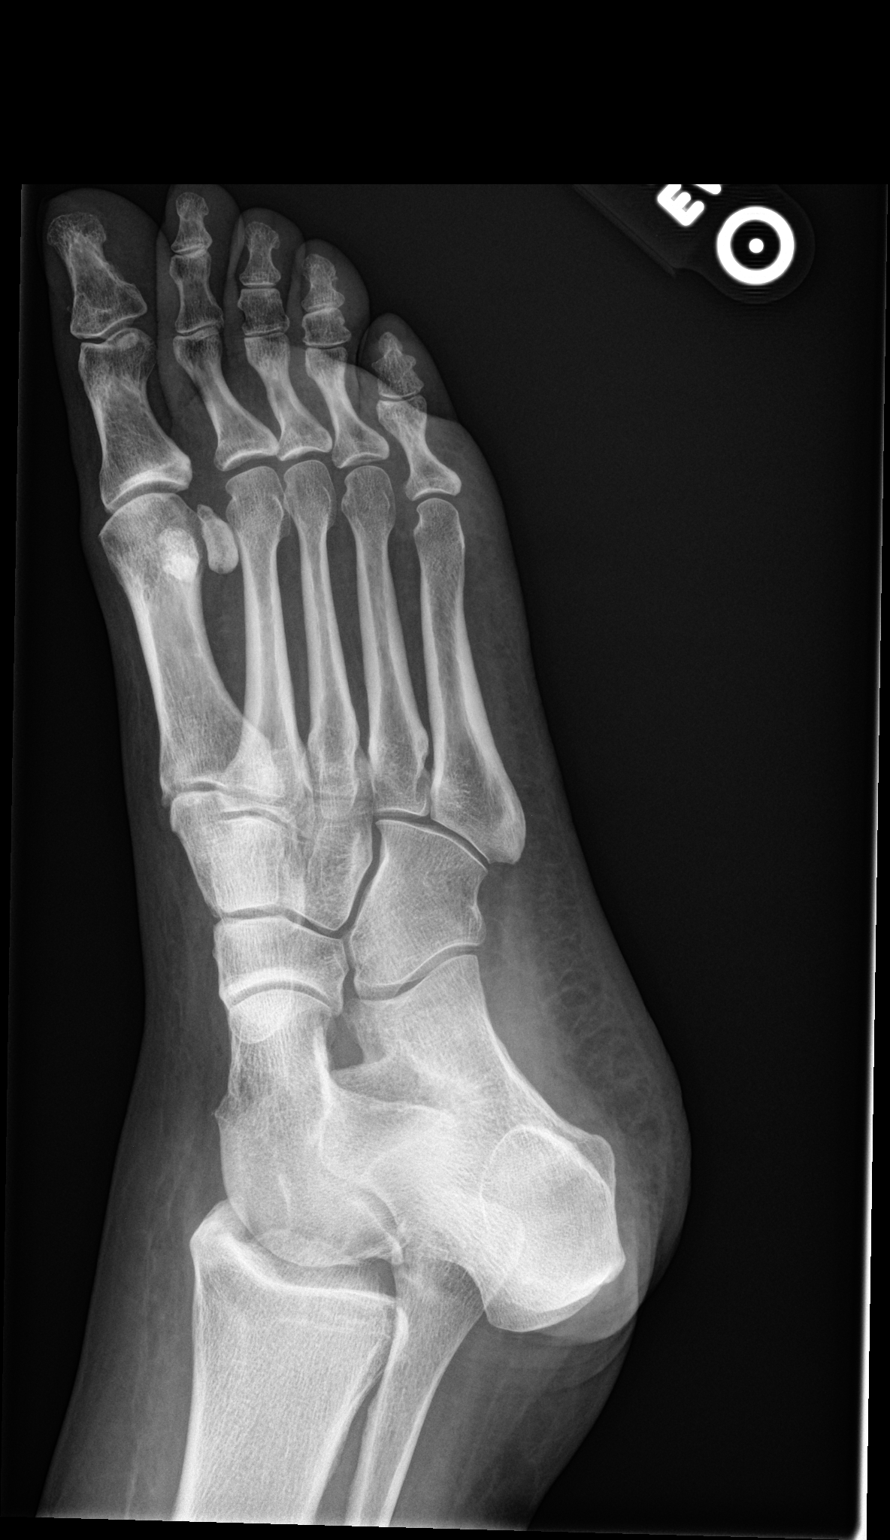

[foot lat]
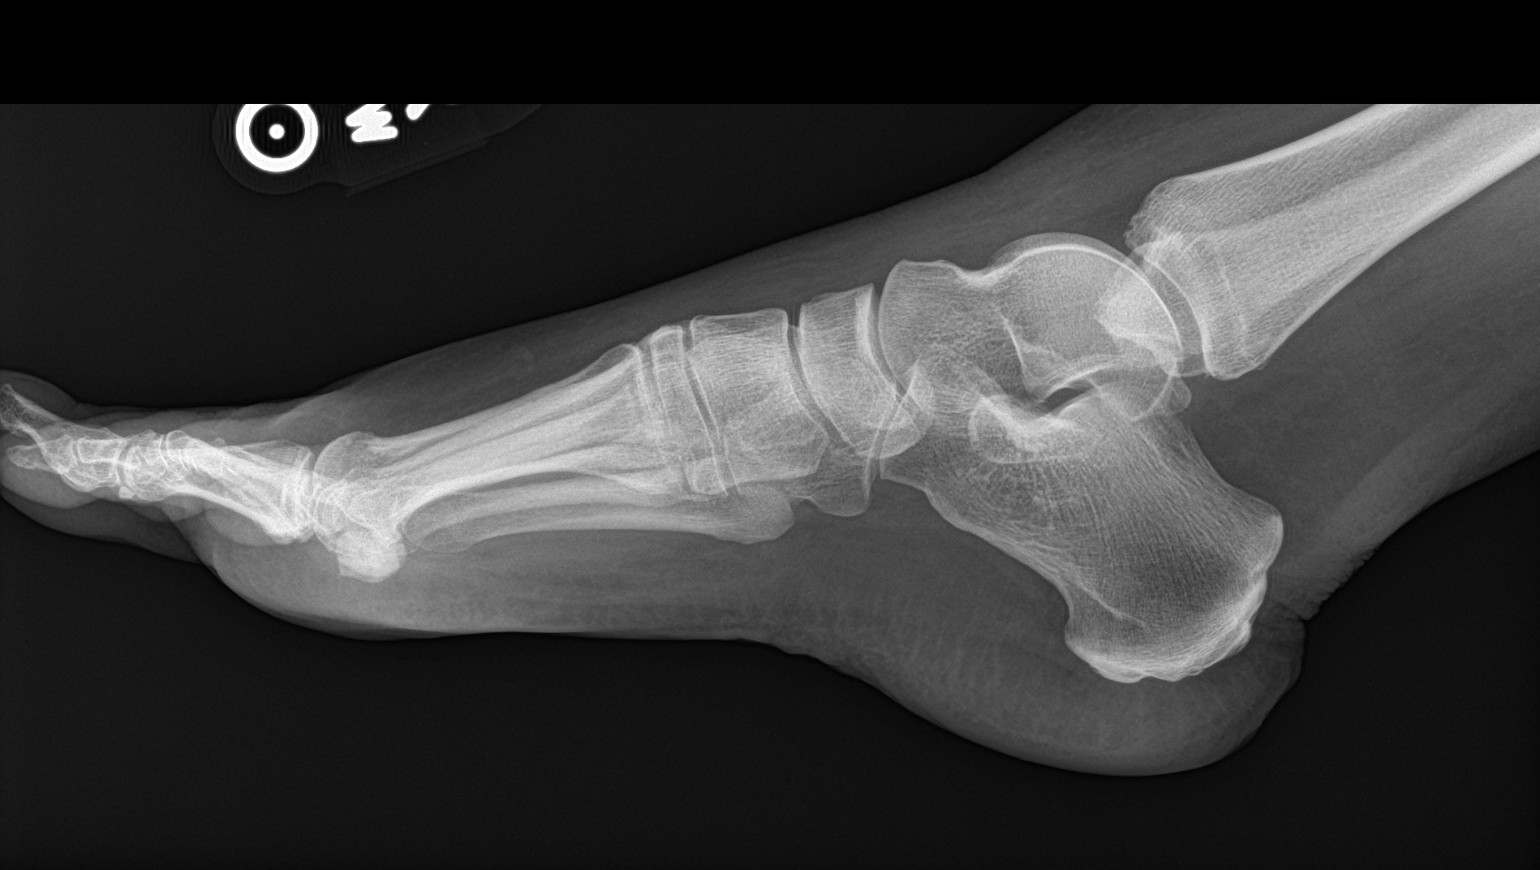

[3 of 3 positions shown; findings below may reference images not displayed]

FINDINGS: Osseous mineralization normal for technique.

Joint spaces preserved.

No acute fracture, dislocation or bone destruction.
IMPRESSION: Normal exam.

## 2016-11-24 IMAGING — DX DG ANKLE COMPLETE 3+V*R*
3 series · 3 of 3 positions shown · non-contrast
Comparison: None.

CLINICAL DATA: Fall 2 days ago with persistent ankle pain, initial
encounter

EXAM:
RIGHT ANKLE - COMPLETE 3+ VIEW

[ankle ap]
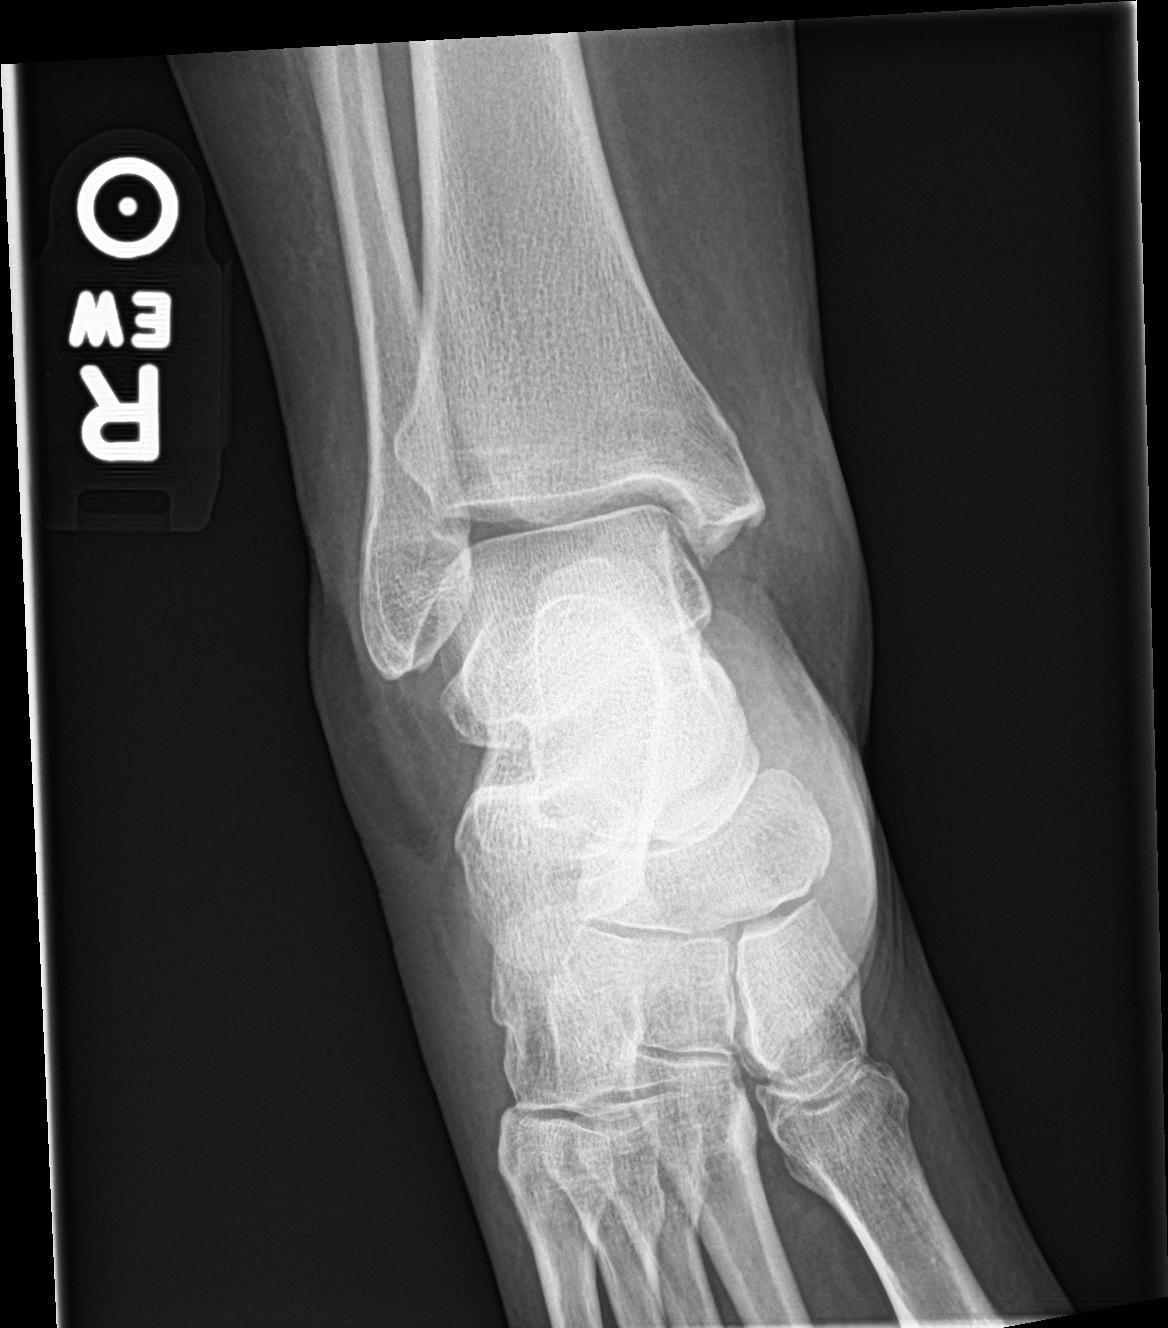

[ankle obl]
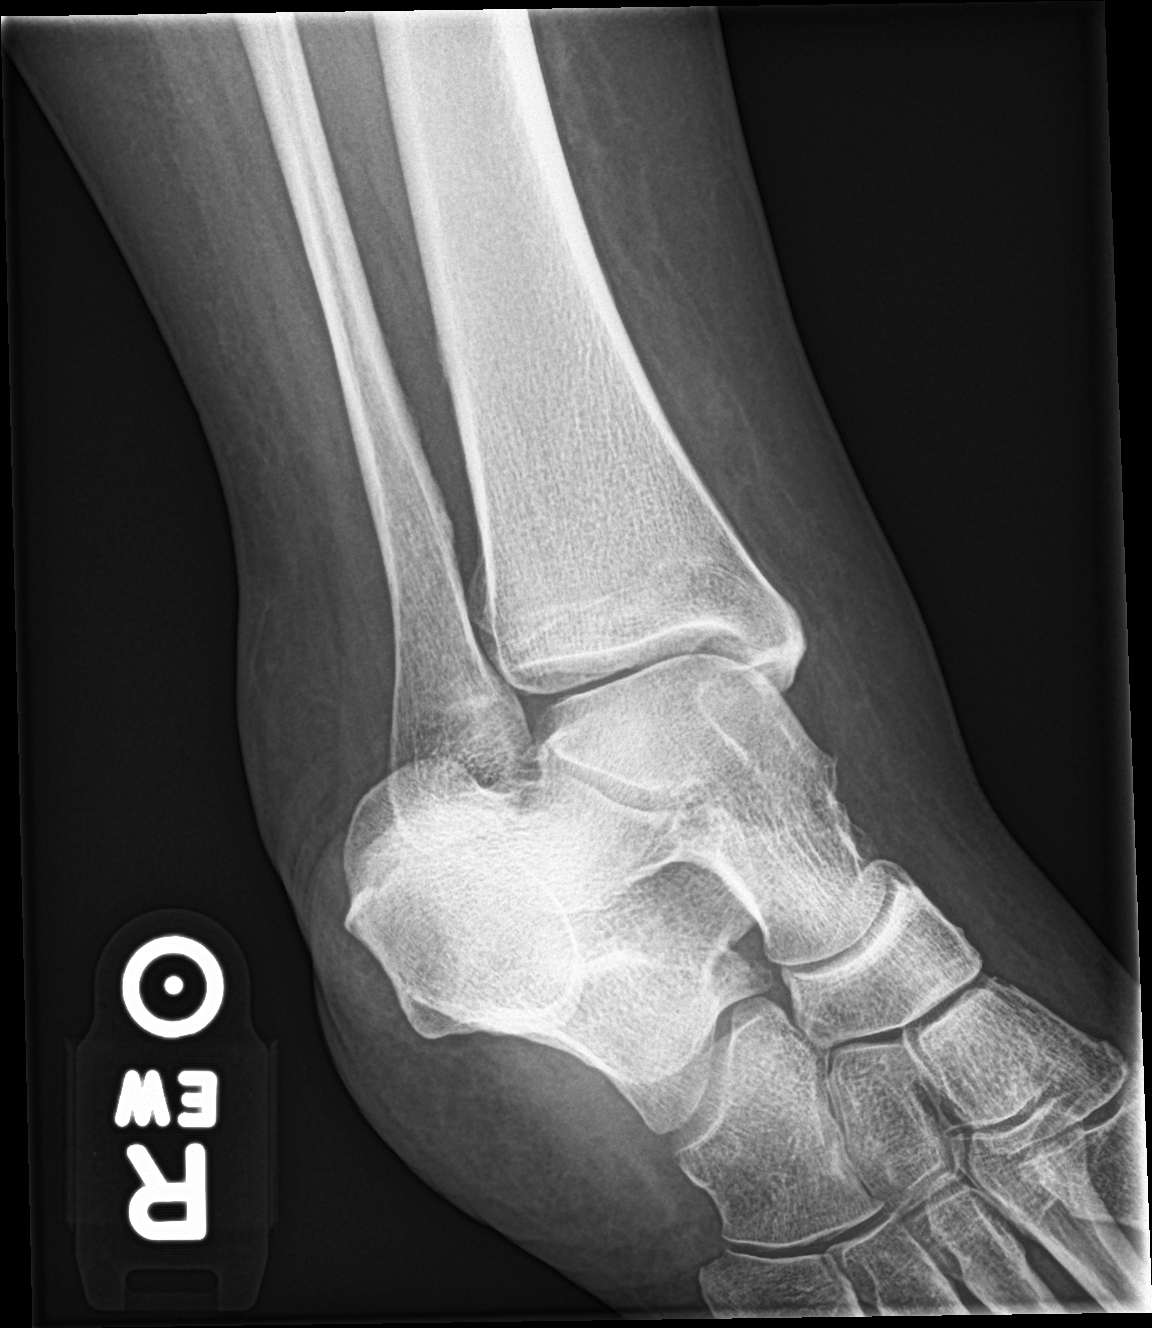

[ankle lat]
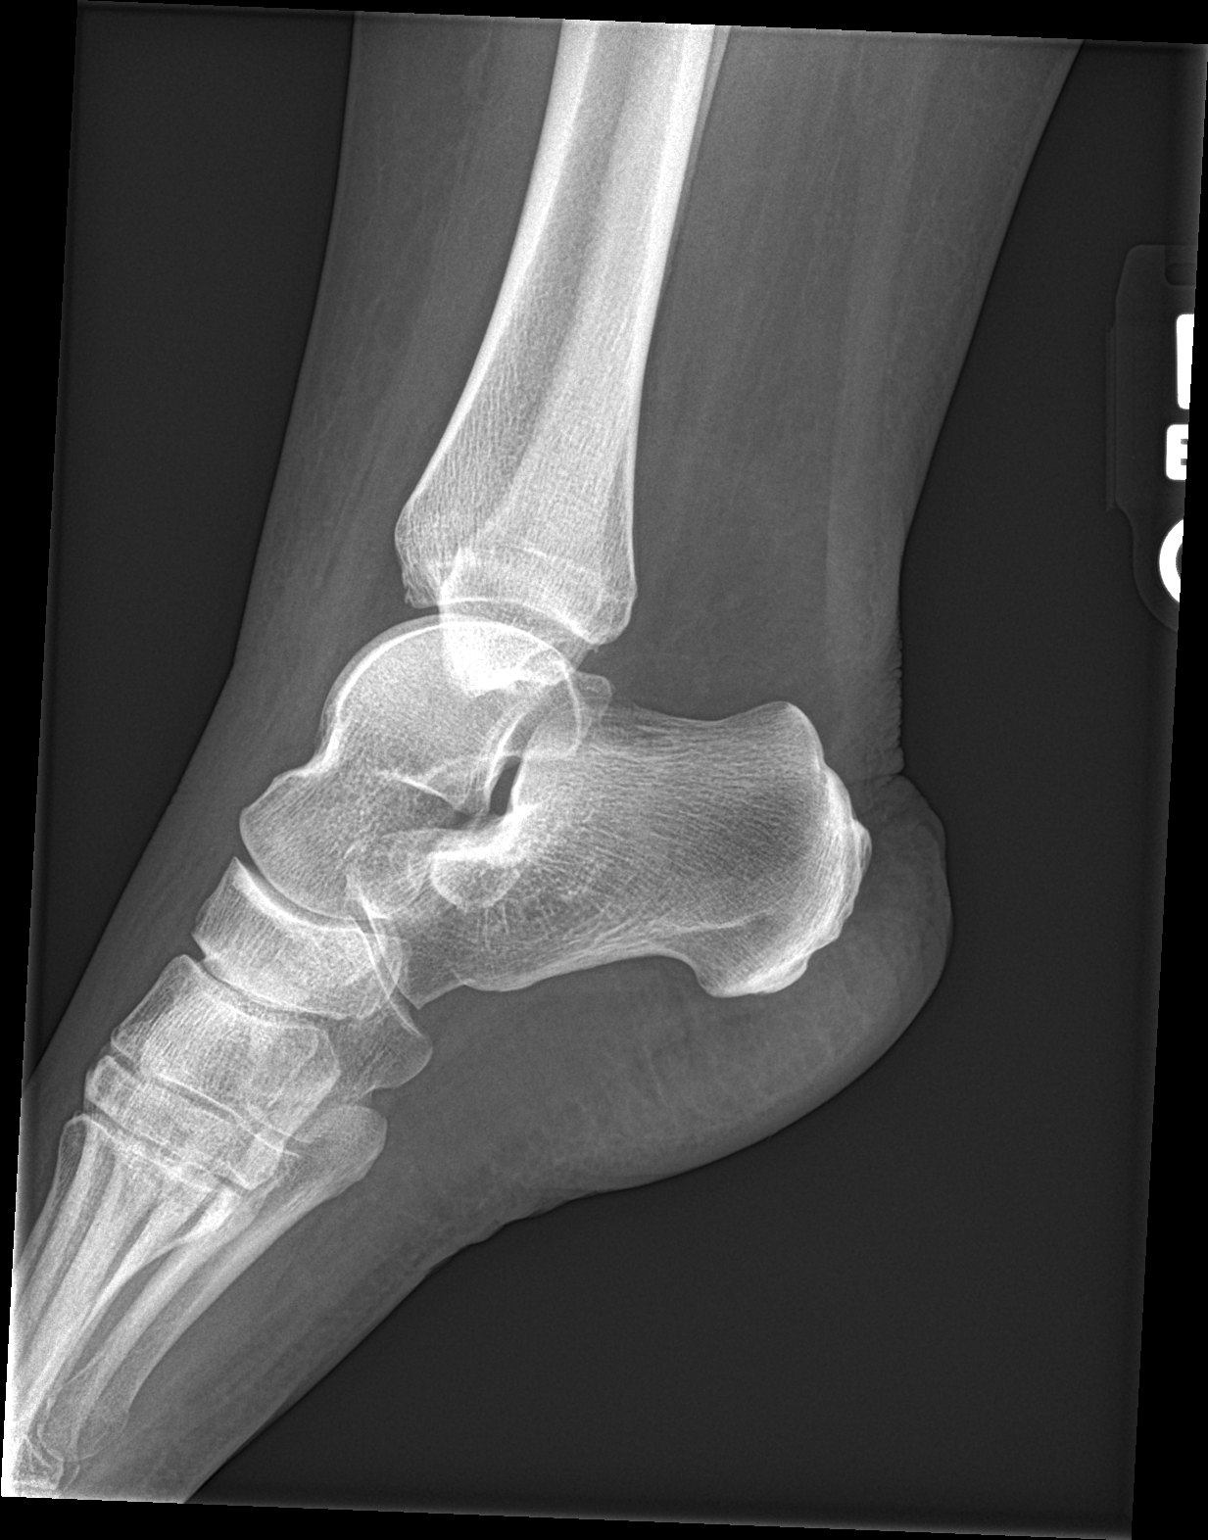

[3 of 3 positions shown; findings below may reference images not displayed]

FINDINGS: There is no evidence of fracture, dislocation, or joint effusion.
There is no evidence of arthropathy or other focal bone abnormality.
Soft tissues are unremarkable.
IMPRESSION: No acute abnormality noted.

## 2016-11-25 ENCOUNTER — Encounter: Payer: BLUE CROSS/BLUE SHIELD | Admitting: Physical Therapy

## 2016-12-01 ENCOUNTER — Encounter: Payer: BLUE CROSS/BLUE SHIELD | Admitting: Physical Therapy

## 2017-03-02 ENCOUNTER — Ambulatory Visit: Payer: BLUE CROSS/BLUE SHIELD | Admitting: Sports Medicine

## 2017-03-07 ENCOUNTER — Encounter: Payer: Self-pay | Admitting: Sports Medicine

## 2017-03-07 ENCOUNTER — Ambulatory Visit (INDEPENDENT_AMBULATORY_CARE_PROVIDER_SITE_OTHER): Payer: BLUE CROSS/BLUE SHIELD | Admitting: Sports Medicine

## 2017-03-07 DIAGNOSIS — M19011 Primary osteoarthritis, right shoulder: Secondary | ICD-10-CM

## 2017-03-07 NOTE — Assessment & Plan Note (Signed)
Previous injection was in May, repeat right glenohumeral injection today.

## 2017-03-07 NOTE — Progress Notes (Signed)
  Subjective:    CC: Right shoulder pain  HPI: This is a pleasant 61 year old female, she has a history of a left total shoulder arthroplasty doing well, has right glenohumeral osteoarthritis currently responding well to occasional injections, the most recent of which was in May of this year. Now having a recurrence of pain, moderate, persistent, localized without radiation.  Past medical history:  Negative.  See flowsheet/record as well for more information.  Surgical history: Negative.  See flowsheet/record as well for more information.  Family history: Negative.  See flowsheet/record as well for more information.  Social history: Negative.  See flowsheet/record as well for more information.  Allergies, and medications have been entered into the medical record, reviewed, and no changes needed.   Review of Systems: No fevers, chills, night sweats, weight loss, chest pain, or shortness of breath.   Objective:    General: Well Developed, well nourished, and in no acute distress.  Neuro: Alert and oriented x3, extra-ocular muscles intact, sensation grossly intact.  HEENT: Normocephalic, atraumatic, pupils equal round reactive to light, neck supple, no masses, no lymphadenopathy, thyroid nonpalpable.  Skin: Warm and dry, no rashes. Cardiac: Regular rate and rhythm, no murmurs rubs or gallops, no lower extremity edema.  Respiratory: Clear to auscultation bilaterally. Not using accessory muscles, speaking in full sentences.  Procedure: Real-time Ultrasound Guided Injection of right glenohumeral joint Device: GE Logiq E  Verbal informed consent obtained.  Time-out conducted.  Noted no overlying erythema, induration, or other signs of local infection.  Skin prepped in a sterile fashion.  Local anesthesia: Topical Ethyl chloride.  With sterile technique and under real time ultrasound guidance:  22-gauge spinal needle advanced into the joint, and injected 1 mL Kenalog 40, 2 mL lidocaine, 2 mL  bupivacaine. Completed without difficulty  Pain immediately resolved suggesting accurate placement of the medication.  Advised to call if fevers/chills, erythema, induration, drainage, or persistent bleeding.  Images permanently stored and available for review in the ultrasound unit.  Impression: Technically successful ultrasound guided injection.  Impression and Recommendations:    Primary osteoarthritis of right shoulder, post left total shoulder arthroplasty Previous injection was in May, repeat right glenohumeral injection today. ___________________________________________ Ihor Austin. Benjamin Stain, M.D., ABFM., CAQSM. Primary Care and Sports Medicine [facility]  Adjunct Instructor of Family Medicine  [employer]

## 2017-03-21 ENCOUNTER — Encounter: Payer: Self-pay | Admitting: Family Medicine

## 2017-03-21 ENCOUNTER — Ambulatory Visit (INDEPENDENT_AMBULATORY_CARE_PROVIDER_SITE_OTHER): Payer: BLUE CROSS/BLUE SHIELD | Admitting: Family Medicine

## 2017-03-21 VITALS — BP 137/71 | HR 75 | Ht 64.0 in | Wt 198.0 lb

## 2017-03-21 DIAGNOSIS — Z0000 Encounter for general adult medical examination without abnormal findings: Secondary | ICD-10-CM

## 2017-03-21 DIAGNOSIS — Z23 Encounter for immunization: Secondary | ICD-10-CM

## 2017-03-21 DIAGNOSIS — L603 Nail dystrophy: Secondary | ICD-10-CM | POA: Diagnosis not present

## 2017-03-21 MED ORDER — LOSARTAN POTASSIUM 50 MG PO TABS: 50.0000 mg | ORAL_TABLET | Freq: Every day | ORAL | 1 refills | Status: DC

## 2017-03-21 MED ORDER — NYSTATIN 100000 UNIT/GM EX POWD: Freq: Four times a day (QID) | CUTANEOUS | 3 refills | Status: DC

## 2017-03-21 NOTE — Patient Instructions (Addendum)

## 2017-03-21 NOTE — Progress Notes (Signed)
Subjective:     Brianna Riley is a 61 y.o. female and is here for a comprehensive physical exam. The patient reports no problems.  She has some problem with dystrophic nails and would like referral to a new podiatrist.  Social History   Social History  . Marital status: Married    Spouse name: N/A  . Number of children: N/A  . Years of education: N/A   Occupational History  . UNEMPLOYED Unemployed  . Retired    Social History Main Topics  . Smoking status: Former Smoker    Quit date: 06/06/1996  . Smokeless tobacco: Never Used  . Alcohol use No     Comment: quit two years ago  . Drug use: No  . Sexual activity: No   Other Topics Concern  . 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 INFLUENZA VACCINE  01/04/2017  . HIV Screening  09/04/2023 (Originally 02/15/1971)  . COLONOSCOPY  12/19/2017  . MAMMOGRAM  11/03/2018  . PAP SMEAR  03/09/2020  . TETANUS/TDAP  01/26/2025  . Hepatitis C Screening  Completed    The following portions of the patient's history were reviewed and updated as appropriate: allergies, current medications, past family history, past medical history, past social history, past surgical history and problem list.  Review of Systems A comprehensive review of systems was negative.   Objective:    BP 137/71   Pulse 75   Ht  (1.626 m)   Wt 198 lb (89.8 kg)   SpO2 97%   BMI 33.99 kg/m  General appearance: alert, cooperative and appears stated age Head: Normocephalic, without obvious abnormality, atraumatic Eyes: conj clear, EOMI, PEERLA Ears: normal TM's and external ear canals both ears Nose: Nares normal. Septum midline. Mucosa normal. No drainage or sinus tenderness. Throat: lips, mucosa, and tongue normal; teeth and gums  normal Neck: no adenopathy, no carotid bruit, no JVD, supple, symmetrical, trachea midline and thyroid not enlarged, symmetric, no tenderness/mass/nodules Back: symmetric, no curvature. ROM normal. No CVA tenderness. Lungs: clear to auscultation bilaterally Breasts: normal appearance, no masses or tenderness, he does have a little bit of erythema under both breasts with a little bit of moistness of the skin. Heart: regular rate and rhythm, S1, S2 normal, no murmur, click, rub or gallop Abdomen: soft, non-tender; bowel sounds normal; no masses,  no organomegaly Extremities: extremities normal, atraumatic, no cyanosis or edema Pulses: 2+ and symmetric Skin: Skin color, texture, turgor normal. No rashes or lesions Lymph nodes: Cervical, supraclavicular, and axillary nodes normal. Neurologic: Alert and oriented X 3, normal strength and tone. Normal symmetric reflexes. Normal coordination and gait    Assessment:    Healthy female exam.      Plan:     See After Visit Summary for Counseling Recommendations   Keep up a regular exercise program and make sure you are eating a healthy diet Try to eat 4 servings of dairy a day, or if you are lactose intolerant take a calcium with vitamin D daily.  Your vaccines are up to date.    Dystrophic nails-we'll refer to podiatry.  Skin candidiasis underneath both breasts. She does use the powder that I gave her occasionally. Will refill nystatin powder.  New perception sent to pharmacy.

## 2017-04-17 ENCOUNTER — Ambulatory Visit: Payer: Self-pay | Admitting: Podiatry

## 2017-05-01 ENCOUNTER — Ambulatory Visit (INDEPENDENT_AMBULATORY_CARE_PROVIDER_SITE_OTHER): Payer: BLUE CROSS/BLUE SHIELD | Admitting: Podiatry

## 2017-05-01 ENCOUNTER — Encounter: Payer: Self-pay | Admitting: Podiatry

## 2017-05-01 VITALS — BP 130/80 | HR 81

## 2017-05-01 DIAGNOSIS — M21969 Unspecified acquired deformity of unspecified lower leg: Secondary | ICD-10-CM

## 2017-05-01 DIAGNOSIS — B351 Tinea unguium: Secondary | ICD-10-CM

## 2017-05-01 DIAGNOSIS — M205X9 Other deformities of toe(s) (acquired), unspecified foot: Secondary | ICD-10-CM

## 2017-05-01 NOTE — Progress Notes (Signed)
Have bad nail problem x 7 years. SUBJECTIVE: 61 y.o. year old female presents complaining of having bad nails on both great toes duration of 7 years. Denies any pain. Patient is referred by Dr. Linford ArnoldMetheney.   Under medical management for HTN and high Cholesterol.  Review of Systems  Constitutional: Negative.   HENT: Negative.   Respiratory: Negative.   Cardiovascular: Negative.   Gastrointestinal: Negative.   Genitourinary: Negative.   Musculoskeletal: Negative.    OBJECTIVE: DERMATOLOGIC EXAMINATION: Yellow discolored nail with fungal debris at distal lateral nail plate both great toes L>R. VASCULAR EXAMINATION OF LOWER LIMBS: Pedal pulses were not palpable on both DP and PT bilateral.  Mild pedal edema bilateral. Temperature gradient from tibial crest to dorsum of foot is within normal bilateral.  NEUROLOGIC EXAMINATION OF THE LOWER LIMBS: All epicritic and tactile sensations grossly intact. Sharp and Dull discriminatory sensations at the plantar ball of hallux is intact bilateral.   MUSCULOSKELETAL EXAMINATION: Positive for hypermobile first ray bilateral. Unable to flex IPJ on left great toe. Hallux limitus with forefoot loading both feet.  ASSESSMENT: Hypermobile first ray bilateral. Hallux limitus bilateral. Onychomycosis both great toe nails. Microtrauma to both great toe nails lateral 1/2 due to hallux limitus.  PLAN: Reviewed findings and available treatment options. All nails debrided. Patient wants to manage nails without using medication. Advised to keep the nails short and brush the end of nail plate after each bath or shower. Change shoes with more toe room. Return as needed.

## 2017-05-01 NOTE — Patient Instructions (Signed)
Seen for discolored and deformed great toe nails. Noted of weak bone structure and microtrauma to the nails. All nails debrided. Use shoes with more room in toe box. Keep nails short and clean. Return as needed.

## 2017-07-21 ENCOUNTER — Ambulatory Visit (INDEPENDENT_AMBULATORY_CARE_PROVIDER_SITE_OTHER): Payer: BLUE CROSS/BLUE SHIELD | Admitting: Sports Medicine

## 2017-07-21 ENCOUNTER — Encounter: Payer: Self-pay | Admitting: Sports Medicine

## 2017-07-21 ENCOUNTER — Ambulatory Visit (INDEPENDENT_AMBULATORY_CARE_PROVIDER_SITE_OTHER): Payer: BLUE CROSS/BLUE SHIELD | Admitting: Family Medicine

## 2017-07-21 ENCOUNTER — Encounter: Payer: Self-pay | Admitting: Family Medicine

## 2017-07-21 VITALS — BP 130/84 | HR 76 | Ht 64.0 in | Wt 200.0 lb

## 2017-07-21 DIAGNOSIS — L081 Erythrasma: Secondary | ICD-10-CM

## 2017-07-21 DIAGNOSIS — M19011 Primary osteoarthritis, right shoulder: Secondary | ICD-10-CM

## 2017-07-21 MED ORDER — CLINDAMYCIN PHOS-BENZOYL PEROX 1-5 % EX GEL: Freq: Two times a day (BID) | CUTANEOUS | 0 refills | Status: DC

## 2017-07-21 MED ORDER — NYSTATIN 100000 UNIT/GM EX POWD: Freq: Four times a day (QID) | CUTANEOUS | 3 refills | Status: DC

## 2017-07-21 MED ORDER — CEPHALEXIN 500 MG PO CAPS: 500.0000 mg | ORAL_CAPSULE | Freq: Three times a day (TID) | ORAL | 0 refills | Status: DC

## 2017-07-21 NOTE — Assessment & Plan Note (Signed)
Repeat right glenohumeral injection, previous injection was in October of last year.

## 2017-07-21 NOTE — Progress Notes (Signed)
Subjective:    CC: Right shoulder pain  HPI: This is a pleasant 62 year old female, she has known right glenohumeral osteoarthritis, unfortunately she is having a worsening of pain, moderate, persistent, localized to the joint line without radiation, previous injection was about 4 months ago.  No mechanical symptoms, no trauma.  Desires repeat interventional treatment today.  I reviewed the past medical history, family history, social history, surgical history, and allergies today and no changes were needed.  Please see the problem list section below in epic for further details.  Past Medical History: Past Medical History:  Diagnosis Date  . Allergic rhinitis, cause unspecified   . Dyspnea   . Fatty liver   . GERD (gastroesophageal reflux disease)   . Hyperlipidemia   . Hypertension, essential, benign   . Insomnia   . Knee pain, bilateral   . Lumbago   . Migraine   . Mild depression (HCC)   . Nonspecific abnormal results of liver function study   . Obesity   . Osteoarthritis   . Perimenopausal    Past Surgical History: Past Surgical History:  Procedure Laterality Date  . CARDIAC CATHETERIZATION  09/2006   Clean, Dr. Jens Som  . CATARACT EXTRACTION W/ INTRAOCULAR LENS IMPLANT  2002   Right and Left  . DOBUTAMINE STRESS ECHO  08/2008  . LAPAROSCOPY ABDOMEN DIAGNOSTIC    . TOTAL SHOULDER ARTHROPLASTY Left 04/21/2016   Procedure: LEFT TOTAL SHOULDER ARTHROPLASTY;  Surgeon: Jones Broom, MD;  Location: [facility] OR;  Service: Orthopedics;  Laterality: Left;  LEFT TOTAL SHOULDER ARTHROPLASTY   Social History: Social History   Socioeconomic History  . Marital status: Married    Spouse name: None  . Number of children: None  . Years of education: None  . Highest education level: None  Social Needs  . Financial resource strain: None  . Food insecurity - worry: None  . Food insecurity - inability: None  . Transportation needs - medical: None  . Transportation needs -  non-medical: None  Occupational History  . Occupation: UNEMPLOYED    Associate Professor: UNEMPLOYED  . Occupation: Retired  Tobacco Use  . Smoking status: Former Smoker    Last attempt to quit: 06/06/1996    Years since quitting: 21.1  . Smokeless tobacco: Never Used  Substance and Sexual Activity  . Alcohol use: No    Comment: quit two years ago  . Drug use: No  . Sexual activity: No  Other Topics Concern  . 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 Lung cancer Mother   . Heart attack Father 74  . Drug abuse Brother    Allergies: Allergies  Allergen Reactions  . Duloxetine Other (See Comments)    Irritable   . Hctz [Hydrochlorothiazide] Other (See Comments)    Caused inc in BUN/CR   Medications: See med rec.  Review of Systems: No fevers, chills, night sweats, weight loss, chest pain, or shortness of breath.   Objective:    General: Well Developed, well nourished, and in no acute distress.  Neuro: Alert and oriented x3, extra-ocular muscles intact, sensation grossly intact.  HEENT: Normocephalic, atraumatic, pupils equal round reactive to light, neck supple, no masses, no lymphadenopathy, thyroid nonpalpable.  Skin: Warm and dry, no rashes. Cardiac: Regular rate and rhythm, no murmurs rubs or gallops, no lower extremity edema.  Respiratory: Clear to auscultation bilaterally. Not using accessory muscles, speaking in full sentences.  Procedure: Real-time Ultrasound Guided Injection of right glenohumeral joint Device: GE Logiq E  Verbal informed consent obtained.  Time-out conducted.  Noted no overlying erythema, induration, or other signs of local infection.  Skin prepped in a sterile fashion.  Local anesthesia: Topical Ethyl chloride.  With  sterile technique and under real time ultrasound guidance: From a posterior approach I advanced into the joint taking care to avoid the labrum, I then injected 1 cc Kenalog 40, 2 cc lidocaine, 2 cc bupivacaine. Completed without difficulty  Pain immediately resolved suggesting accurate placement of the medication.  Advised to call if fevers/chills, erythema, induration, drainage, or persistent bleeding.  Images permanently stored and available for review in the ultrasound unit.  Impression: Technically successful ultrasound guided injection.  Impression and Recommendations:    Primary osteoarthritis of right shoulder, post left total shoulder arthroplasty Repeat right glenohumeral injection, previous injection was in October of last year.  ___________________________________________ Ihor Austinhomas J. Benjamin Stainhekkekandam, M.D., ABFM., CAQSM. Primary Care and Sports Medicine [facility]  Adjunct Instructor of Family Medicine  [employer]

## 2017-07-21 NOTE — Progress Notes (Signed)
Subjective:    Patient ID: Brianna Riley, female    DOB: [birth year], 62 y.o.   MRN: [id number]  HPI   62 year old female complains of a rash underneath her left breast in particular.  She has had problems with this before and has used nystatin powder in the past which was helpful.  She is also been using an over-the-counter cream which she says helped tremendously initially but what she is been finding is that she is actually been scratching at it in her sleep and then waking up and realizing that it is actually bleeding.  It feels better when she wears a bra but when she days does not wear a bra at night it gets more irritated.  She said she is been trying to clean it really well and pat dry.  She is been using powders on it as well.   Review of Systems  BP 130/84   Pulse 76   Ht 5\' 4"  (1.626 m)   Wt 200 lb (90.7 kg)   BMI 34.33 kg/m     Allergies  Allergen Reactions  . Duloxetine Other (See Comments)    Irritable   . Hctz [Hydrochlorothiazide] Other (See Comments)    Caused inc in BUN/CR    Past Medical History:  Diagnosis Date  . Allergic rhinitis, cause unspecified   . Dyspnea   . Fatty liver   . GERD (gastroesophageal reflux disease)   . Hyperlipidemia   . Hypertension, essential, benign   . Insomnia   . Knee pain, bilateral   . Lumbago   . Migraine   . Mild depression (HCC)   . Nonspecific abnormal results of liver function study   . Obesity   . Osteoarthritis   . Perimenopausal     Past Surgical History:  Procedure Laterality Date  . CARDIAC CATHETERIZATION  09/2006   Clean, Dr. Jens Som  . CATARACT EXTRACTION W/ INTRAOCULAR LENS IMPLANT  2002   Right and Left  . DOBUTAMINE STRESS ECHO  08/2008  . LAPAROSCOPY ABDOMEN DIAGNOSTIC    . TOTAL SHOULDER ARTHROPLASTY Left 04/21/2016   Procedure: LEFT TOTAL SHOULDER ARTHROPLASTY;  Surgeon: Jones Broom, MD;  Location: [facility] OR;  Service: Orthopedics;  Laterality: Left;  LEFT TOTAL SHOULDER ARTHROPLASTY     Social History   Socioeconomic History  . Marital status: Married    Spouse name: Not on file  . Number of children: Not on file  . Years of education: Not on file  . Highest education level: Not on file  Social Needs  . Financial resource strain: Not on file  . Food insecurity - worry: Not on file  . Food insecurity - inability: Not on file  . Transportation needs - medical: Not on file  . Transportation needs - non-medical: Not on file  Occupational History  . Occupation: UNEMPLOYED    Associate Professor: UNEMPLOYED  . Occupation: Retired  Tobacco Use  . Smoking status: Former Smoker    Last attempt to quit: 06/06/1996    Years since quitting: 21.1  . Smokeless tobacco: Never Used  Substance and Sexual Activity  . Alcohol use: No    Comment: quit two years ago  . Drug use: No  . Sexual activity: No  Other Topics Concern  . 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 Lung cancer Mother   . Heart attack Father [id number]  . Drug abuse Brother     Outpatient Encounter Medications as of 07/21/2017  Medication Sig  . aspirin 325 MG tablet Take 650-1,000 mg by mouth See admin instructions. Take 650 mg by mouth in the morning and take 1000 mg by mouth in the evening  . atorvastatin (LIPITOR) 80 MG tablet Take 1 tablet (80 mg total) by mouth daily.  . fish oil-omega-3 fatty acids 1000 MG capsule Take 1 capsule by mouth daily.    . Flaxseed, Linseed, (FLAX SEED OIL) 1000 MG CAPS Take 1,000 mg by mouth daily.   . Garcinia Cambogia-Chromium 500-200 MG-MCG TABS Take 1 tablet by mouth daily.   . Garlic Oil 500 MG CAPS Take 161500 mg by mouth daily.   Marland Kitchen. losartan (COZAAR) 50 MG tablet Take 1 tablet (50 mg total) by mouth daily.  Marland Kitchen. MELATONIN PO Take 1 tablet by mouth daily.    . milk thistle 175 MG tablet Take 175 mg by mouth daily.  . Multiple Vitamins-Iron (MULTIVITAMIN/IRON) TABS Take 1 tablet by mouth daily.    . niacin (SLO-NIACIN) 500 MG tablet Take 500 mg by mouth at bedtime.  Marland Kitchen. nystatin (MYCOSTATIN/NYSTOP) powder Apply topically 4 (four) times daily. Under both breast for 2 weeks. PRN>  . pantoprazole (PROTONIX) 40 MG tablet Take 1 tablet (40 mg total) by mouth daily.  . traMADol (ULTRAM) 50 MG tablet take 1 to 2 tablets by mouth every 8 hours (MAX 6 TABLETS PER DAY)  . [DISCONTINUED] nystatin (MYCOSTATIN/NYSTOP) powder Apply topically 4 (four) times daily. Under both breast for 2 weeks. PRN>  . cephALEXin (KEFLEX) 500 MG capsule Take 1 capsule (500 mg total) by mouth 3 (three) times daily.  . clindamycin-benzoyl peroxide (BENZACLIN) gel Apply topically 2 (two) times daily.   No facility-administered encounter medications on file as of 07/21/2017.           Objective:   Physical Exam  Constitutional: She is oriented to person, place, and time. She appears well-developed and well-nourished.  HENT:  Head: Normocephalic and atraumatic.  Eyes: Conjunctivae and EOM are normal.  Cardiovascular: Normal rate.  Pulmonary/Chest: Effort normal.  Neurological: She is alert and oriented to person, place, and time.  Skin: Skin is dry. No pallor.  She has significant area of erythema and some mild maceration underneath the left breast.  There are also some excoriations.  I do not see any satellite lesions.  There is a most a dusky appearance on the chest wall beneath the breast.  The rash is well demarcated.  Psychiatric: She has a normal mood and affect. Her behavior is normal.  Vitals reviewed.       Assessment & Plan:  Erythrasma -there were no significant satellite lesions on exam so most consistent with erythrasma though I am worried about potential for secondary infection as she has been scratching at it.  So we will treat with BenzaClin gel in addition  to a nystatin powder and she can do one in the morning and 1 in the evening.  If no significant improvement over the next 10 days and please give us a call back.

## 2017-08-25 ENCOUNTER — Other Ambulatory Visit: Payer: Self-pay | Admitting: Family Medicine

## 2017-10-02 ENCOUNTER — Other Ambulatory Visit: Payer: Self-pay | Admitting: Family Medicine

## 2017-10-11 ENCOUNTER — Other Ambulatory Visit: Payer: Self-pay | Admitting: Family Medicine

## 2017-10-11 DIAGNOSIS — Z1231 Encounter for screening mammogram for malignant neoplasm of breast: Secondary | ICD-10-CM

## 2017-10-27 ENCOUNTER — Ambulatory Visit (INDEPENDENT_AMBULATORY_CARE_PROVIDER_SITE_OTHER): Payer: BLUE CROSS/BLUE SHIELD | Admitting: Family Medicine

## 2017-10-27 ENCOUNTER — Encounter: Payer: Self-pay | Admitting: Family Medicine

## 2017-10-27 ENCOUNTER — Ambulatory Visit (INDEPENDENT_AMBULATORY_CARE_PROVIDER_SITE_OTHER): Payer: BLUE CROSS/BLUE SHIELD | Admitting: Sports Medicine

## 2017-10-27 VITALS — BP 136/83 | HR 81 | Ht 64.0 in | Wt 200.0 lb

## 2017-10-27 DIAGNOSIS — N183 Chronic kidney disease, stage 3 (moderate): Secondary | ICD-10-CM | POA: Diagnosis not present

## 2017-10-27 DIAGNOSIS — R7301 Impaired fasting glucose: Secondary | ICD-10-CM

## 2017-10-27 DIAGNOSIS — M19011 Primary osteoarthritis, right shoulder: Secondary | ICD-10-CM | POA: Diagnosis not present

## 2017-10-27 DIAGNOSIS — R0683 Snoring: Secondary | ICD-10-CM | POA: Diagnosis not present

## 2017-10-27 DIAGNOSIS — R5383 Other fatigue: Secondary | ICD-10-CM | POA: Diagnosis not present

## 2017-10-27 DIAGNOSIS — I10 Essential (primary) hypertension: Secondary | ICD-10-CM

## 2017-10-27 DIAGNOSIS — N1832 Chronic kidney disease, stage 3b: Secondary | ICD-10-CM

## 2017-10-27 LAB — POCT GLYCOSYLATED HEMOGLOBIN (HGB A1C): HEMOGLOBIN A1C: 5.6 % (ref 4.0–5.6)

## 2017-10-27 MED ORDER — LOSARTAN POTASSIUM 50 MG PO TABS: 50.0000 mg | ORAL_TABLET | Freq: Every day | ORAL | 1 refills | Status: DC

## 2017-10-27 MED ORDER — ATORVASTATIN CALCIUM 80 MG PO TABS: 80.0000 mg | ORAL_TABLET | Freq: Every day | ORAL | 1 refills | Status: DC

## 2017-10-27 NOTE — Progress Notes (Signed)
Subjective:    CC:   HPI: Hypertension- Pt denies chest pain, SOB, dizziness, or heart palpitations.  Taking meds as directed w/o problems.  Denies medication side effects.    Impaired fasting glucose-no increased thirst or urination. No symptoms consistent with hypoglycemia.  CKD 3 -doing well.  No recent changes in urination.  GERD -she says most the time she has an over-the-counter PPI if she needs something.  She does not take it every day.  She complains that she just does not feel very motivated to do anything.  She complains of little interest and pleasure doing things nearly every day but denies feeling depressed or hopeless.  She just does not feel motivated.  She reports that her husband says that she does snore she is not sure how frequently.  She admits she really does not sleep well in general.  She feels like she worries excessively about her husband because he has some health problems.  Past medical history, Surgical history, Family history not pertinant except as noted below, Social history, Allergies, and medications have been entered into the medical record, reviewed, and corrections made.   Review of Systems: No fevers, chills, night sweats, weight loss, chest pain, or shortness of breath.   Objective:    General: Well Developed, well nourished, and in no acute distress.  Neuro: Alert and oriented x3, extra-ocular muscles intact, sensation grossly intact.  HEENT: Normocephalic, atraumatic  Skin: Warm and dry, no rashes. Cardiac: Regular rate and rhythm, no murmurs rubs or gallops, no lower extremity edema.  Respiratory: Clear to auscultation bilaterally. Not using accessory muscles, speaking in full sentences.   Impression and Recommendations:    HTN - Well controlled. Continue current regimen. Follow up in  6 mo. due for CMP.  IFG -globin A1c of 5.6 today which absolutely looks fantastic.  We will continue to monitor.  Plan to recheck in 1 year.  CKD 3  -due BUN  and creatinine as well as urine microalbumin.  GERD -just using PPI as needed.  Fatigue - PHQ 9 score of 9-mostly what she is describing is apathy.  Snoring-stop bang questionnaire performed. Score of 3, screen positive for possible risk of sleep apnea.  Discussed options for testing.  She would prefer to do home sleep study if at all possible as her husband has chronic medical problems and she really cannot leave him alone.  They do not really have any friends or family nearby enough to stay with him.

## 2017-10-27 NOTE — Progress Notes (Signed)
Subjective:    CC: Right shoulder pain  HPI: This is a pleasant 62 year old female, for the past several weeks she said increasing pain in her shoulder, the joint lines, moderate, persistent, no radiation, worse with abduction, external rotation, she is post left total shoulder arthroplasty.  She has had a right glenohumeral injection about 3 months ago.  I reviewed the past medical history, family history, social history, surgical history, and allergies today and no changes were needed.  Please see the problem list section below in epic for further details.  Past Medical History: Past Medical History:  Diagnosis Date  . Allergic rhinitis, cause unspecified   . Dyspnea   . Fatty liver   . GERD (gastroesophageal reflux disease)   . Hyperlipidemia   . Hypertension, essential, benign   . Insomnia   . Knee pain, bilateral   . Lumbago   . Migraine   . Mild depression (HCC)   . Nonspecific abnormal results of liver function study   . Obesity   . Osteoarthritis   . Perimenopausal    Past Surgical History: Past Surgical History:  Procedure Laterality Date  . CARDIAC CATHETERIZATION  09/2006   Clean, Dr. Jens Som  . CATARACT EXTRACTION W/ INTRAOCULAR LENS IMPLANT  2002   Right and Left  . DOBUTAMINE STRESS ECHO  08/2008  . LAPAROSCOPY ABDOMEN DIAGNOSTIC    . TOTAL SHOULDER ARTHROPLASTY Left 04/21/2016   Procedure: LEFT TOTAL SHOULDER ARTHROPLASTY;  Surgeon: Jones Broom, MD;  Location: [facility] OR;  Service: Orthopedics;  Laterality: Left;  LEFT TOTAL SHOULDER ARTHROPLASTY   Social History: Social History   Socioeconomic History  . Marital status: Married    Spouse name: Not on file  . Number of children: Not on file  . Years of education: Not on file  . Highest education level: Not on file  Occupational History  . 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 Financial resource strain: Not on file  . Food insecurity:    Worry:  Not on file    Inability: Not on file  . Transportation needs:    Medical: Not on file    Non-medical: Not on file  Tobacco Use  . Smoking status: Former Smoker    Last attempt to quit: 06/06/1996    Years since quitting: 21.4  . Smokeless tobacco: Never Used  Substance and Sexual Activity  . Alcohol use: No    Comment: quit two years ago  . Drug use: No  . Sexual activity: Never  Lifestyle  . Physical activity:    Days per week: Not on file    Minutes per session: Not on file  . Stress: Not on file  Relationships  . Social connections:    Talks on phone: Not on file    Gets together: Not on file    Attends religious service: Not on file    Active member of club or organization: Not on file    Attends meetings of clubs or organizations: Not on file    Relationship status: Not on file  Other Topics Concern  . 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 Lung cancer Mother   .  Heart attack Father 55  . Drug abuse Brother    Allergies: Allergies  Allergen Reactions  . Duloxetine Other (See Comments)    Irritable   . Hctz [Hydrochlorothiazide] Other (See Comments)    Caused inc in BUN/CR   Medications: See med rec.  Review of Systems: No fevers, chills, night sweats, weight loss, chest pain, or shortness of breath.   Objective:    General: Well Developed, well nourished, and in no acute distress.  Neuro: Alert and oriented x3, extra-ocular muscles intact, sensation grossly intact.  HEENT: Normocephalic, atraumatic, pupils equal round reactive to light, neck supple, no masses, no lymphadenopathy, thyroid nonpalpable.  Skin: Warm and dry, no rashes. Cardiac: Regular rate and rhythm, no murmurs rubs or gallops, no lower extremity  edema.  Respiratory: Clear to auscultation bilaterally. Not using accessory muscles, speaking in full sentences. Right shoulder: Pain with abduction, external rotation, external rotation to only about 30 degrees.  Abduction to 20 degrees.  Procedure: Real-time Ultrasound Guided Injection of right glenohumeral injection Device: GE Logiq E  Verbal informed consent obtained.  Time-out conducted.  Noted no overlying erythema, induration, or other signs of local infection.  Skin prepped in a sterile fashion.  Local anesthesia: Topical Ethyl chloride.  With sterile technique and under real time ultrasound guidance: From a posterior approach I advanced into the glenohumeral joint and injected 1 cc kenalog 40, 2 cc lidocaine, 2 cc bupivacaine. Completed without difficulty  Pain immediately resolved suggesting accurate placement of the medication.  Advised to call if fevers/chills, erythema, induration, drainage, or persistent bleeding.  Images permanently stored and available for review in the ultrasound unit.  Impression: Technically successful ultrasound guided injection.  Impression and Recommendations:    Primary osteoarthritis of right shoulder, post left total shoulder arthroplasty Repeat right glenohumeral injection, previous injection was in February. She is post left total shoulder arthroplasty.   ___________________________________________ Ihor Austin. Benjamin Stain, M.D., ABFM., CAQSM. Primary Care and Sports Medicine [facility]  Adjunct Instructor of Family Medicine  [employer]

## 2017-10-27 NOTE — Assessment & Plan Note (Addendum)
Repeat right glenohumeral injection, previous injection was in February. She is post left total shoulder arthroplasty.

## 2017-10-28 LAB — TSH: TSH: 1.47 m[IU]/L (ref 0.40–4.50)

## 2017-10-28 LAB — CBC
HEMATOCRIT: 38.9 % (ref 35.0–45.0)
Hemoglobin: 12.7 g/dL (ref 11.7–15.5)
MCH: 29.2 pg (ref 27.0–33.0)
MCHC: 32.6 g/dL (ref 32.0–36.0)
MCV: 89.4 fL (ref 80.0–100.0)
MPV: 10.7 fL (ref 7.5–12.5)
Platelets: 274 10*3/uL (ref 140–400)
RBC: 4.35 10*6/uL (ref 3.80–5.10)
RDW: 14.9 % (ref 11.0–15.0)
WBC: 5.8 10*3/uL (ref 3.8–10.8)

## 2017-10-28 LAB — COMPLETE METABOLIC PANEL WITH GFR
AG RATIO: 1.6 (calc) (ref 1.0–2.5)
ALBUMIN MSPROF: 3.8 g/dL (ref 3.6–5.1)
ALT: 23 U/L (ref 6–29)
AST: 27 U/L (ref 10–35)
Alkaline phosphatase (APISO): 79 U/L (ref 33–130)
BILIRUBIN TOTAL: 0.4 mg/dL (ref 0.2–1.2)
BUN / CREAT RATIO: 20 (calc) (ref 6–22)
BUN: 22 mg/dL (ref 7–25)
CALCIUM: 9.5 mg/dL (ref 8.6–10.4)
CO2: 27 mmol/L (ref 20–32)
Chloride: 108 mmol/L (ref 98–110)
Creat: 1.1 mg/dL — ABNORMAL HIGH (ref 0.50–0.99)
GFR, EST AFRICAN AMERICAN: 63 mL/min/{1.73_m2} (ref >=60)
GFR, EST NON AFRICAN AMERICAN: 55 mL/min/{1.73_m2} — AB (ref >=60)
GLOBULIN: 2.4 g/dL (ref 1.9–3.7)
Glucose, Bld: 92 mg/dL (ref 65–99)
POTASSIUM: 4.5 mmol/L (ref 3.5–5.3)
Sodium: 143 mmol/L (ref 135–146)
TOTAL PROTEIN: 6.2 g/dL (ref 6.1–8.1)

## 2017-10-28 LAB — MICROALBUMIN / CREATININE URINE RATIO
Creatinine, Urine: 70 mg/dL (ref 20–275)
MICROALB/CREAT RATIO: 11 ug/mg{creat} (ref <30)
Microalb, Ur: 0.8 mg/dL

## 2017-10-28 LAB — LIPID PANEL
CHOL/HDL RATIO: 2.8 (calc) (ref <5.0)
Cholesterol: 152 mg/dL (ref <200)
HDL: 55 mg/dL (ref >50)
LDL CHOLESTEROL (CALC): 74 mg/dL
Non-HDL Cholesterol (Calc): 97 mg/dL (calc) (ref <130)
Triglycerides: 145 mg/dL (ref <150)

## 2017-11-02 NOTE — Progress Notes (Signed)
I faxed order to [facility] they will call and schedule with patient - CF

## 2017-11-03 ENCOUNTER — Ambulatory Visit (INDEPENDENT_AMBULATORY_CARE_PROVIDER_SITE_OTHER): Payer: BLUE CROSS/BLUE SHIELD

## 2017-11-03 ENCOUNTER — Other Ambulatory Visit: Payer: Self-pay | Admitting: Family Medicine

## 2017-11-03 DIAGNOSIS — Z1231 Encounter for screening mammogram for malignant neoplasm of breast: Secondary | ICD-10-CM

## 2017-11-09 NOTE — Progress Notes (Deleted)
Left message for patient to call back if she did not receive the monitor for home. -HSM.

## 2017-11-13 ENCOUNTER — Other Ambulatory Visit: Payer: Self-pay | Admitting: Family Medicine

## 2017-12-18 ENCOUNTER — Other Ambulatory Visit: Payer: Self-pay | Admitting: Family Medicine

## 2017-12-22 ENCOUNTER — Other Ambulatory Visit: Payer: Self-pay | Admitting: *Deleted

## 2017-12-22 MED ORDER — PANTOPRAZOLE SODIUM 40 MG PO TBEC: 40.0000 mg | DELAYED_RELEASE_TABLET | Freq: Every day | ORAL | 3 refills | Status: DC

## 2018-01-03 ENCOUNTER — Telehealth: Payer: Self-pay

## 2018-01-03 DIAGNOSIS — Z1211 Encounter for screening for malignant neoplasm of colon: Secondary | ICD-10-CM

## 2018-01-03 NOTE — Telephone Encounter (Signed)
Attempted to call again. No ans and "machine is off". Unable to leave msg

## 2018-01-03 NOTE — Telephone Encounter (Signed)
Pt called requesting order be faxed for her to receive Cologuard.   Order has been entered and faxed to [facility]olog[facility] along with copy of ins. Cards   Called to advise pt this had been done but female answered phone and said he would have her call me back

## 2018-01-05 NOTE — Telephone Encounter (Signed)
Attempted to call, no ans and "the machine is off"

## 2018-01-09 ENCOUNTER — Ambulatory Visit: Payer: BLUE CROSS/BLUE SHIELD | Admitting: Sports Medicine

## 2018-01-09 NOTE — Telephone Encounter (Signed)
Attempted to call and update pt again, unable to reach her.

## 2018-01-18 ENCOUNTER — Ambulatory Visit (INDEPENDENT_AMBULATORY_CARE_PROVIDER_SITE_OTHER): Payer: BLUE CROSS/BLUE SHIELD | Admitting: Sports Medicine

## 2018-01-18 VITALS — BP 137/83 | HR 85 | Ht 64.0 in | Wt 199.0 lb

## 2018-01-18 DIAGNOSIS — M19011 Primary osteoarthritis, right shoulder: Secondary | ICD-10-CM | POA: Diagnosis not present

## 2018-01-18 DIAGNOSIS — M19012 Primary osteoarthritis, left shoulder: Secondary | ICD-10-CM | POA: Diagnosis not present

## 2018-01-18 DIAGNOSIS — Z23 Encounter for immunization: Secondary | ICD-10-CM

## 2018-01-18 MED ORDER — TRAMADOL HCL 50 MG PO TABS: 50.0000 mg | ORAL_TABLET | Freq: Every day | ORAL | 3 refills | Status: DC | PRN

## 2018-01-18 NOTE — Progress Notes (Signed)
Subjective:    CC: Right shoulder pain  HPI: This is a very pleasant 62 year old female, she has known right glenohumeral osteoarthritis, she is post left total shoulder arthroplasty with moderate results.  Now having a recurrence of pain, injection was just under 3 months ago.  Pain is at the joint line, moderate, persistent without radiation, trauma, mechanical symptoms.  I reviewed the past medical history, family history, social history, surgical history, and allergies today and no changes were needed.  Please see the problem list section below in epic for further details.  Past Medical History: Past Medical History:  Diagnosis Date  . Allergic rhinitis, cause unspecified   . Dyspnea   . Fatty liver   . GERD (gastroesophageal reflux disease)   . Hyperlipidemia   . Hypertension, essential, benign   . Insomnia   . Knee pain, bilateral   . Lumbago   . Migraine   . Mild depression (HCC)   . Nonspecific abnormal results of liver function study   . Obesity   . Osteoarthritis   . Perimenopausal    Past Surgical History: Past Surgical History:  Procedure Laterality Date  . CARDIAC CATHETERIZATION  09/2006   Clean, Dr. Jens Somrenshaw  . CATARACT EXTRACTION W/ INTRAOCULAR LENS IMPLANT  2002   Right and Left  . DOBUTAMINE STRESS ECHO  08/2008  . LAPAROSCOPY ABDOMEN DIAGNOSTIC    . TOTAL SHOULDER ARTHROPLASTY Left 04/21/2016   Procedure: LEFT TOTAL SHOULDER ARTHROPLASTY;  Surgeon: Jones BroomJustin Chandler, MD;  Location: [facility] OR;  Service: Orthopedics;  Laterality: Left;  LEFT TOTAL SHOULDER ARTHROPLASTY   Social History: Social History   Socioeconomic History  . Marital status: Married    Spouse name: Not on file  . Number of children: Not on file  . Years of education: Not on file  . Highest education level: Not on file  Occupational History  . Occupation: UNEMPLOYED    Associate Professormployer: UNEMPLOYED  . Occupation: Retired  Engineer, productionocial Needs  . Financial resource strain: Not on file  . Food  insecurity:    Worry: Not on file    Inability: Not on file  . Transportation needs:    Medical: Not on file    Non-medical: Not on file  Tobacco Use  . Smoking status: Former Smoker    Last attempt to quit: 06/06/1996    Years since quitting: 21.6  . Smokeless tobacco: Never Used  Substance and Sexual Activity  . Alcohol use: No    Comment: quit two years ago  . Drug use: No  . Sexual activity: Never  Lifestyle  . Physical activity:    Days per week: Not on file    Minutes per session: Not on file  . Stress: Not on file  Relationships  . Social connections:    Talks on phone: Not on file    Gets together: Not on file    Attends religious service: Not on file    Active member of club or organization: Not on file    Attends meetings of clubs or organizations: Not on file    Relationship status: Not on file  Other Topics Concern  . 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 Lung  cancer Mother   . Heart attack Father [id number]  . Drug abuse Brother    Allergies: Allergies  Allergen Reactions  . Duloxetine Other (See Comments)    Irritable   . Hctz [Hydrochlorothiazide] Other (See Comments)    Caused inc in BUN/CR   Medications: See med rec.  Review of Systems: No fevers, chills, night sweats, weight loss, chest pain, or shortness of breath.   Objective:    General: Well Developed, well nourished, and in no acute distress.  Neuro: Alert and oriented x3, extra-ocular muscles intact, sensation grossly intact.  HEENT: Normocephalic, atraumatic, pupils equal round reactive to light, neck supple, no masses, no lymphadenopathy, thyroid nonpalpable.  Skin: Warm and dry, no rashes. Cardiac: Regular rate and rhythm, no murmurs rubs or  gallops, no lower extremity edema.  Respiratory: Clear to auscultation bilaterally. Not using accessory muscles, speaking in full sentences.  Procedure: Real-time Ultrasound Guided Injection of right glenohumeral joint Device: GE Logiq E  Verbal informed consent obtained.  Time-out conducted.  Noted no overlying erythema, induration, or other signs of local infection.  Skin prepped in a sterile fashion.  Local anesthesia: Topical Ethyl chloride.  With sterile technique and under real time ultrasound guidance: Using a 22-gauge spinal needle advanced into the joint, taking care to avoid the labrum and injected 1 cc kenalog 40, 2 cc lidocaine, 2 cc bupivacaine. Completed without difficulty  Pain immediately resolved suggesting accurate placement of the medication.  Advised to call if fevers/chills, erythema, induration, drainage, or persistent bleeding.  Images permanently stored and available for review in the ultrasound unit.  Impression: Technically successful ultrasound guided injection.  Impression and Recommendations:    Primary osteoarthritis of right shoulder, post left total shoulder arthroplasty She is about a week and a half early for her injection. We did it today, she will do tramadol as needed if she continues to have pain prior to the 5152-month threshold. Refuses shoulder arthroplasty on the right, moderate relief on the left. ___________________________________________ Ihor Austinhomas J. Benjamin Stainhekkekandam, M.D., ABFM., CAQSM. Primary Care and Sports Medicine [facility]  Adjunct Instructor of Family Medicine  [employer]

## 2018-01-18 NOTE — Assessment & Plan Note (Signed)
She is about a week and a half early for her injection. We did it today, she will do tramadol as needed if she continues to have pain prior to the 6118-month threshold. Refuses shoulder arthroplasty on the right, moderate relief on the left.

## 2018-01-24 DIAGNOSIS — Z1212 Encounter for screening for malignant neoplasm of rectum: Secondary | ICD-10-CM | POA: Diagnosis not present

## 2018-01-24 DIAGNOSIS — Z1211 Encounter for screening for malignant neoplasm of colon: Secondary | ICD-10-CM | POA: Diagnosis not present

## 2018-01-26 LAB — COLOGUARD: COLOGUARD: NEGATIVE

## 2018-02-07 ENCOUNTER — Telehealth: Payer: Self-pay | Admitting: Family Medicine

## 2018-02-07 NOTE — Telephone Encounter (Signed)
Tried to call patient and no answer or voicemail. KG LPN

## 2018-02-07 NOTE — Telephone Encounter (Signed)
Call patient and let her know that her Cologuard results were negative which is fantastic.  Recommend repeat colon cancer screening in 3 years. 

## 2018-02-08 NOTE — Telephone Encounter (Signed)
Please mail letter.

## 2018-02-09 ENCOUNTER — Encounter: Payer: Self-pay | Admitting: *Deleted

## 2018-02-09 ENCOUNTER — Encounter: Payer: Self-pay | Admitting: Family Medicine

## 2018-02-09 NOTE — Telephone Encounter (Signed)
Letter mailed informing pt of results and recommendations.Heath Gold, CMA

## 2018-02-09 NOTE — Telephone Encounter (Signed)
Call patient and let her know that her Cologuard results were negative which is fantastic.  Recommend repeat colon cancer screening in 3 years.

## 2018-04-27 ENCOUNTER — Ambulatory Visit: Payer: BLUE CROSS/BLUE SHIELD | Admitting: Sports Medicine

## 2018-06-04 ENCOUNTER — Ambulatory Visit: Payer: BLUE CROSS/BLUE SHIELD | Admitting: Sports Medicine

## 2018-06-14 ENCOUNTER — Encounter: Payer: BLUE CROSS/BLUE SHIELD | Admitting: Family Medicine

## 2018-06-18 ENCOUNTER — Encounter: Payer: BLUE CROSS/BLUE SHIELD | Admitting: Family Medicine

## 2018-06-21 IMAGING — CT CT SHOULDER*L* W/O CM
2 of 5 series · 5 of 14 positions shown, 6 images · non-contrast
Comparison: None.

CLINICAL DATA: Decreased range of motion, pain in the left shoulder

EXAM:
CT OF THE LEFT SHOULDER WITHOUT CONTRAST
TECHNIQUE: Multidetector CT imaging was performed according to the standard
protocol. Multiplanar CT image reconstructions were also generated.

[Series 5: left shoulder soft · axial · 0.45mm/px · z∈[-188,-110]mm · 2 of 93 slices shown]
[im 31/93  soft-tissue]
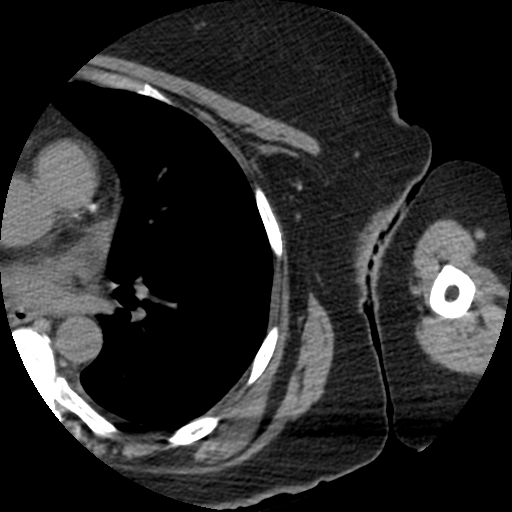
[im 62/93  soft-tissue]
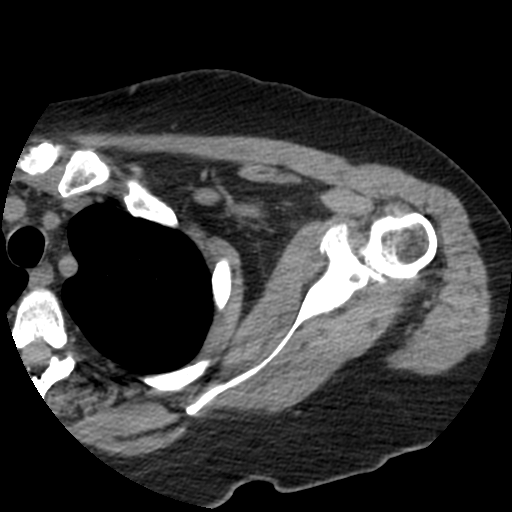

[Series 200: cor bone · axial · 0.45mm/px · z∈[-84,+35]mm · 3 of 82 slices shown, 4 images]
[im 1/82  soft-tissue]
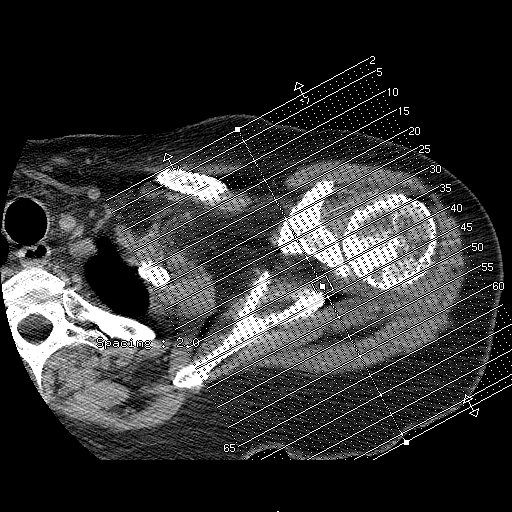
[im 1/82  bone]
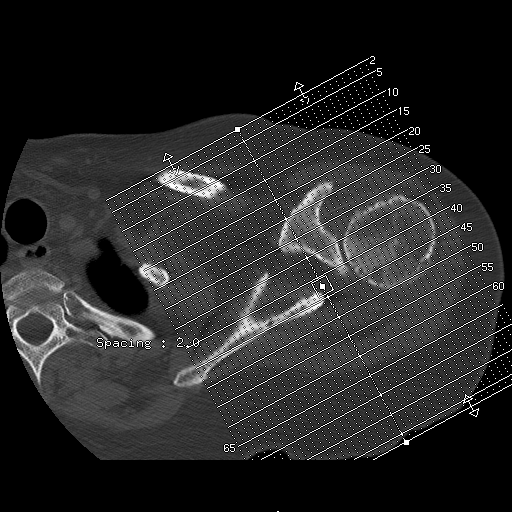
[im 41/82  bone]
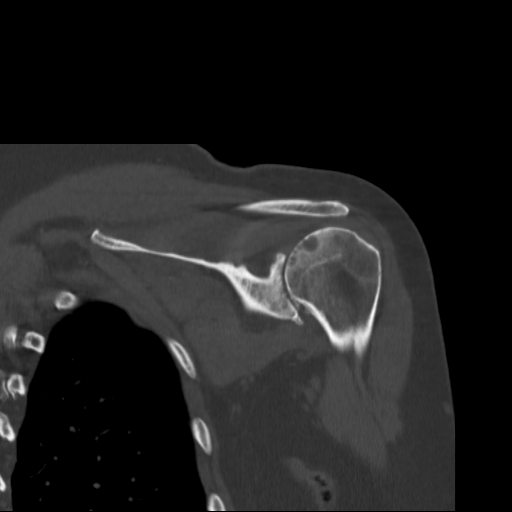
[im 82/82  bone]
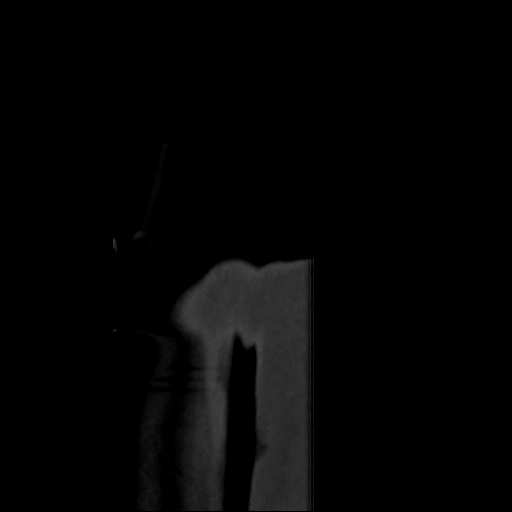

[5 of 14 positions shown; findings below may reference images not displayed]

FINDINGS: Bones/Joint/Cartilage

No fracture or dislocation. Normal alignment. No joint effusion.

Severe osteoarthritis of the left glenohumeral joint with severe
joint space narrowing, subchondral sclerosis, subchondral cystic
changes and marginal osteophytosis. Loose body in the subcoracoid
recess measuring 2 cm.

Normal acromioclavicular joint.  Type I acromion.

No lytic or sclerotic osseous lesion.

Ligaments

Ligaments are suboptimally evaluated by CT.

Muscles and Tendons
Muscles are normal without atrophy. No intramuscular fluid
collection or hematoma.

Soft tissue
No fluid collection or hematoma. No soft tissue mass. 16 mm left
thyroid nodule.
IMPRESSION: 1. Severe osteoarthritis of the left shoulder.
2. 16 mm left thyroid nodule of indeterminate etiology. The nodule
is similar in size to the prior MRI cervical spine dated 04/14/2014.

## 2018-07-03 ENCOUNTER — Other Ambulatory Visit: Payer: Self-pay | Admitting: Family Medicine

## 2018-07-03 DIAGNOSIS — I10 Essential (primary) hypertension: Secondary | ICD-10-CM

## 2018-07-13 ENCOUNTER — Encounter: Payer: BLUE CROSS/BLUE SHIELD | Admitting: Family Medicine

## 2018-08-07 ENCOUNTER — Ambulatory Visit: Payer: BLUE CROSS/BLUE SHIELD | Admitting: Family Medicine

## 2018-08-07 ENCOUNTER — Ambulatory Visit: Payer: BLUE CROSS/BLUE SHIELD | Admitting: Sports Medicine

## 2018-08-16 IMAGING — CR DG SHOULDER 1V*L*
1 series · 1 of 1 positions shown · non-contrast
Comparison: CT left shoulder February 25, 2016

CLINICAL DATA: Status post total shoulder arthroplasty

EXAM:
LEFT SHOULDER - 1 VIEW

[AP]
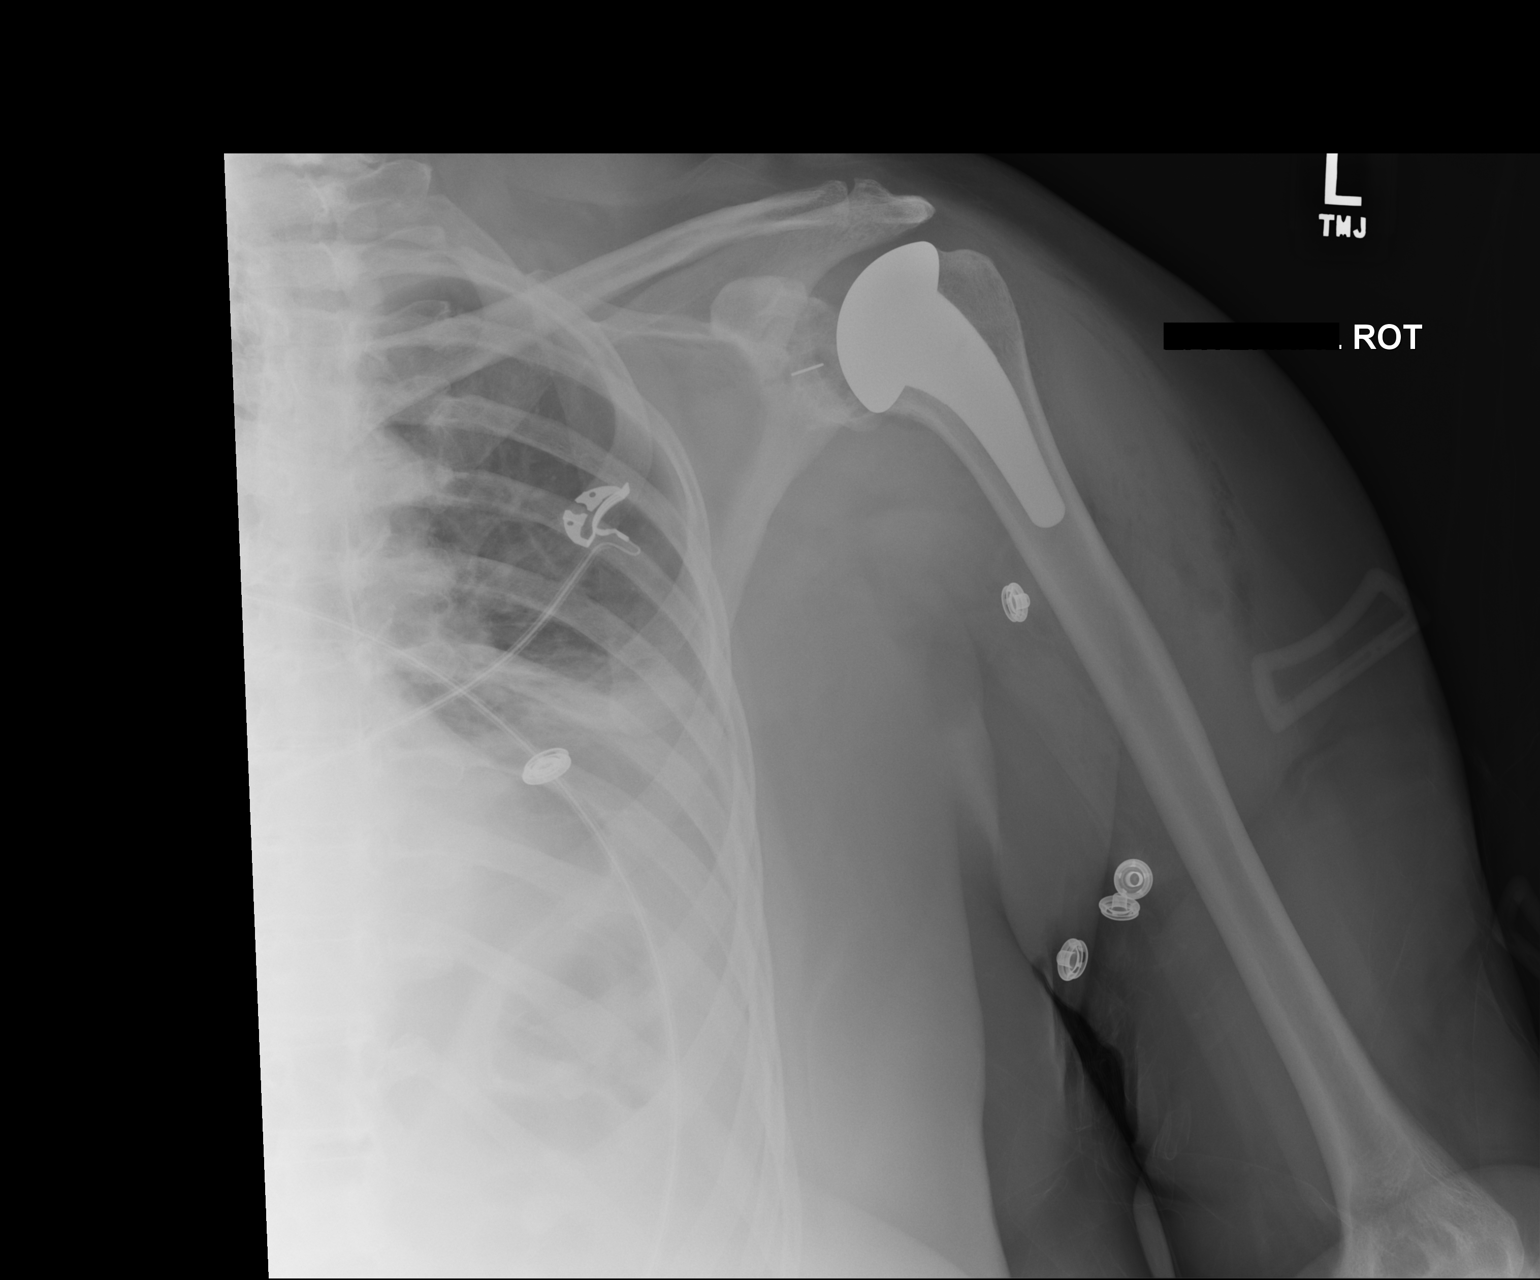

[1 of 1 positions shown; findings below may reference images not displayed]

FINDINGS: Frontal view with external rotation obtained. There is a total
shoulder prosthesis on the left which appears well seated. No acute
fracture or dislocation. There is mild narrowing of the
acromioclavicular joint. There is atelectatic change in the left
lung base.
IMPRESSION: Total shoulder prosthesis on the left well seated. No fracture or
dislocation. Atelectasis left lung base. Narrowing left
acromioclavicular joint.

## 2018-11-12 ENCOUNTER — Other Ambulatory Visit: Payer: Self-pay | Admitting: Sports Medicine

## 2018-11-12 DIAGNOSIS — M19012 Primary osteoarthritis, left shoulder: Secondary | ICD-10-CM

## 2018-11-12 DIAGNOSIS — M19011 Primary osteoarthritis, right shoulder: Secondary | ICD-10-CM

## 2018-11-12 NOTE — Telephone Encounter (Signed)
[facility] requesting med refill for tramadol.

## 2018-11-12 NOTE — Telephone Encounter (Signed)
Med ordered by T. I haven't seen pt in over a year.

## 2019-01-25 ENCOUNTER — Other Ambulatory Visit: Payer: Self-pay | Admitting: Family Medicine

## 2019-01-25 DIAGNOSIS — I10 Essential (primary) hypertension: Secondary | ICD-10-CM

## 2019-02-07 ENCOUNTER — Other Ambulatory Visit: Payer: Self-pay | Admitting: Family Medicine

## 2019-02-07 DIAGNOSIS — I10 Essential (primary) hypertension: Secondary | ICD-10-CM

## 2019-02-14 ENCOUNTER — Other Ambulatory Visit: Payer: Self-pay | Admitting: Family Medicine

## 2019-02-14 DIAGNOSIS — I10 Essential (primary) hypertension: Secondary | ICD-10-CM

## 2019-04-04 ENCOUNTER — Ambulatory Visit: Payer: BLUE CROSS/BLUE SHIELD | Admitting: Family Medicine

## 2019-04-10 ENCOUNTER — Telehealth: Payer: Self-pay | Admitting: *Deleted

## 2019-04-10 NOTE — Telephone Encounter (Signed)
Pt called and stated that her bp's have been elevated and on 04/01/2019 she was calling for EMS for her husband and when they got there they noticed that she was sweating profusely so they checked her bp and did an ekg and wanted her to go to the ED to be checked out but she refused. She said that she has not had anymore episodes of sweating like that (because the temperatures outside have dropped?). Denies any CP/SOB.    appt made for tomorrow for elevated bp's she was told to arrive/check in at 250 pm. She agreed.Brianna Riley, Lahoma Crocker, CMA

## 2019-04-11 ENCOUNTER — Other Ambulatory Visit: Payer: Self-pay

## 2019-04-11 ENCOUNTER — Ambulatory Visit (INDEPENDENT_AMBULATORY_CARE_PROVIDER_SITE_OTHER): Payer: BC Managed Care – PPO | Admitting: Family Medicine

## 2019-04-11 VITALS — BP 153/95 | HR 84 | Ht 64.0 in | Wt 167.0 lb

## 2019-04-11 DIAGNOSIS — R634 Abnormal weight loss: Secondary | ICD-10-CM | POA: Diagnosis not present

## 2019-04-11 DIAGNOSIS — B372 Candidiasis of skin and nail: Secondary | ICD-10-CM

## 2019-04-11 DIAGNOSIS — R7301 Impaired fasting glucose: Secondary | ICD-10-CM | POA: Diagnosis not present

## 2019-04-11 DIAGNOSIS — E7800 Pure hypercholesterolemia, unspecified: Secondary | ICD-10-CM

## 2019-04-11 DIAGNOSIS — R9431 Abnormal electrocardiogram [ECG] [EKG]: Secondary | ICD-10-CM | POA: Diagnosis not present

## 2019-04-11 DIAGNOSIS — I10 Essential (primary) hypertension: Secondary | ICD-10-CM

## 2019-04-11 DIAGNOSIS — N1832 Chronic kidney disease, stage 3b: Secondary | ICD-10-CM

## 2019-04-11 DIAGNOSIS — I4510 Unspecified right bundle-branch block: Secondary | ICD-10-CM

## 2019-04-11 DIAGNOSIS — Z1231 Encounter for screening mammogram for malignant neoplasm of breast: Secondary | ICD-10-CM

## 2019-04-11 MED ORDER — FLUCONAZOLE 150 MG PO TABS: 150.0000 mg | ORAL_TABLET | ORAL | 0 refills | Status: DC

## 2019-04-11 MED ORDER — ATORVASTATIN CALCIUM 80 MG PO TABS: ORAL_TABLET | ORAL | 3 refills | Status: DC

## 2019-04-11 NOTE — Patient Instructions (Signed)
Go ahead and restart your blood pressure pill until we get the results of your labs back.

## 2019-04-11 NOTE — Progress Notes (Signed)
Established Patient Office Visit  Subjective:  Patient ID: Brianna Riley, female    DOB: 1955-11-30  Age: 63 y.o. MRN: [id number]  CC:  Chief Complaint  Patient presents with  . Hypertension    HPI [name] presents for "not feeling well".  She says about a week ago she actually called EMS for her husband.  She says today started not feeling well.  She cannot describe exactly what it felt like but almost like a weakness.  She did not have any pain or nausea or headache.  She says that she looked a little sweaty and so EMS checked her out as well.  She says that they did a blood pressure and it was quite low and did an EKG which they told her was abnormal.  She did not seek additional care at the time.  Because her blood pressure was low she said she just stopped her blood pressure pill and has not been taking it since then.  She reports that for several months now she has just not felt like eating very much.  She says most the time she eats 1 meal a day and occasionally a snack.  But over the last couple months she has not felt like eating much at all she says some days she will still eat once but then other times she will go 2 to 3 days without actually eating any food.  She feels like she hydrates well she does still usually have 1 or 2 cups of coffee first thing in the morning.  She denies any abnormal pain.  She says she did check her home blood pressure today on her cuff that her husband uses and it was 189/107 this morning.  Again denies any headache, chills nausea vomiting or abdominal pain or chest pain or shortness of breath.  She says that she does sweat occasionally but that is not abnormal for her.  But says sometimes when she gets up to do activities she will just suddenly felt like she has to sit down and rest.  Almost feeling like a little bit of a weakness.  But no lightheadedness or dizziness or near syncope.  Otherwise she denies any recent changes to her medication  regimen.  Still battling a rash underneath both breasts.  We have tried topical nystatin powder and cream neither of which seem to help.  Past Medical History:  Diagnosis Date  . Allergic rhinitis, cause unspecified   . Dyspnea   . Fatty liver   . GERD (gastroesophageal reflux disease)   . Hyperlipidemia   . Hypertension, essential, benign   . Insomnia   . Knee pain, bilateral   . Lumbago   . Migraine   . Mild depression (HCC)   . Nonspecific abnormal results of liver function study   . Obesity   . Osteoarthritis   . Perimenopausal     Past Surgical History:  Procedure Laterality Date  . CARDIAC CATHETERIZATION  09/2006   [place], Dr. Jens Som  . CATARACT EXTRACTION W/ INTRAOCULAR LENS IMPLANT  2002   Right and Left  . DOBUTAMINE STRESS ECHO  08/2008  . LAPAROSCOPY ABDOMEN DIAGNOSTIC    . 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 Lung cancer Mother   . Heart attack Father 54  .  Drug abuse Brother     Social History   Socioeconomic History  . Marital status: Married    Spouse name: Not on file  . Number of children: Not on file  . Years of education: Not on file  . Highest education level: Not on file  Occupational History  . Occupation: UNEMPLOYED    Associate Professormployer: UNEMPLOYED  . Occupation: Retired  Engineer, productionocial Needs  . Financial resource strain: Not on file  . Food insecurity    Worry: Not on file    Inability: Not on file  . Transportation needs    Medical: Not on file    Non-medical: Not on file  Tobacco Use  . Smoking status: Former Smoker    Quit date: 06/06/1996    Years since quitting: 22.8  . Smokeless tobacco: Never Used  Substance and Sexual Activity  . Alcohol use: No    Comment: quit two years ago  . Drug use: No  . Sexual  activity: Never  Lifestyle  . Physical activity    Days per week: Not on file    Minutes per session: Not on file  . Stress: Not on file  Relationships  . Social Musicianconnections    Talks on phone: Not on file    Gets together: Not on file    Attends religious service: Not on file    Active member of club or organization: Not on file    Attends meetings of clubs or organizations: Not on file    Relationship status: Not on file  . Intimate partner violence    Fear of current or ex partner: Not on file    Emotionally abused: Not on file    Physically abused: Not on file    Forced sexual activity: Not on file  Other Topics Concern  . 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 aspirin 325 MG tablet Take 650-1,000 mg by mouth See admin instructions. Take 650 mg by mouth in the morning and take 1000 mg by mouth in the evening    . fish oil-omega-3 fatty acids 1000 MG capsule Take 1 capsule by mouth daily.      . Flaxseed, Linseed, (FLAX SEED OIL) 1000 MG CAPS Take 1,000 mg by mouth daily.     . Garcinia Cambogia-Chromium 500-200 MG-MCG TABS Take 1 tablet by mouth daily.     . Garlic Oil 500 MG CAPS Take 161500 mg by mouth daily.     Marland Kitchen. MELATONIN PO Take 1 tablet by mouth daily.     . milk thistle 175 MG tablet Take 175 mg by mouth daily.    . Multiple Vitamins-Iron (MULTIVITAMIN/IRON) TABS Take 1 tablet by mouth daily.      Marland Kitchen. atorvastatin (LIPITOR) 80 MG tablet TAKE 1 TABLET(80 MG) BY MOUTH DAILY 90 tablet 1  . clindamycin-benzoyl peroxide (BENZACLIN) gel Apply topically 2 (two) times daily. 25 g 0  . losartan (COZAAR) 50 MG tablet Take 1 tablet (50 mg total) by mouth daily. LAST REFILL.MUST SCHEDULE AND KEEP APPOINTMENT FOR REFILLS 90 tablet 0  .  niacin (SLO-NIACIN) 500 MG tablet Take 500 mg by mouth at bedtime.    . pantoprazole (PROTONIX) 40 MG  tablet Take 1 tablet (40 mg total) by mouth daily. 90 tablet 3  . traMADol (ULTRAM) 50 MG tablet Take 1 tablet (50 mg total) by mouth daily as needed. (Patient not taking: Reported on 04/11/2019) 30 tablet 3   No facility-administered medications prior to visit.     Allergies  Allergen Reactions  . Duloxetine Other (See Comments)    Irritable   . Hctz [Hydrochlorothiazide] Other (See Comments)    Caused inc in BUN/CR    ROS Review of Systems    Objective:    Physical Exam  Constitutional: She is oriented to person, place, and time. She appears well-developed and well-nourished.  HENT:  Head: Normocephalic and atraumatic.  Right Ear: External ear normal.  Left Ear: External ear normal.  Nose: Nose normal.  Mouth/Throat: Oropharynx is clear and moist.  Left TM and canals clear.  Right TM is blocked by cerumen.  Eyes: Pupils are equal, round, and reactive to light. Conjunctivae and EOM are normal.  Neck: Neck supple. No thyromegaly present.  Cardiovascular: Normal rate, regular rhythm and normal heart sounds.  Pulmonary/Chest: Effort normal and breath sounds normal. She has no wheezes.  She does have some erythema underneath both breasts.  Margins are well demarcated.  It has a moist appearance to it.  Abdominal: Soft. Bowel sounds are normal. She exhibits no distension and no mass. There is no abdominal tenderness. There is no rebound and no guarding.  Lymphadenopathy:    She has no cervical adenopathy.  Neurological: She is alert and oriented to person, place, and time.  Skin: Skin is warm and dry.  Psychiatric: She has a normal mood and affect.    BP (!) 153/95   Pulse 84   Ht 5\' 4"  (1.626 m)   Wt 167 lb (75.8 kg)   SpO2 97%   BMI 28.67 kg/m  Wt Readings from Last 3 Encounters:  04/11/19 167 lb (75.8 kg)  01/18/18 199 lb (90.3 kg)  10/27/17 200 lb (90.7 kg)      There are no preventive care reminders to display for this patient.  There are no preventive care reminders to display for this patient.  Lab Results  Component Value Date   TSH 2.04 04/11/2019   Lab Results  Component Value Date   WBC 6.2 04/11/2019   HGB 13.4 04/11/2019   HCT 41.7 04/11/2019   MCV 90.8 04/11/2019   PLT 314 04/11/2019   Lab Results  Component Value Date   NA 144 04/11/2019   K 4.7 04/11/2019   CO2 30 04/11/2019   GLUCOSE 90 04/11/2019   BUN 23 04/11/2019   CREATININE 1.04 (H) 04/11/2019   BILITOT 0.4 04/11/2019   ALKPHOS 114 10/24/2016   AST 38 (H) 04/11/2019   ALT 35 (H) 04/11/2019   PROT 6.9 04/11/2019   ALBUMIN 3.8 10/24/2016   CALCIUM 9.8 04/11/2019   ANIONGAP 9 04/22/2016   Lab Results  Component Value Date   CHOL 179 04/11/2019   Lab Results  Component Value Date   HDL 61 04/11/2019   Lab Results  Component Value Date   LDLCALC 93 04/11/2019   Lab Results  Component Value Date   TRIG 146 04/11/2019   Lab Results  Component Value Date   CHOLHDL 2.9 04/11/2019   Lab Results  Component Value Date   HGBA1C 5.3 04/11/2019      Assessment & Plan:   Problem List Items Addressed This Visit      Cardiovascular and Mediastinum  HYPERTENSION, BENIGN ESSENTIAL - Primary    Uncontrolled.  Encouraged her to check her blood pressure at home a couple times a week for the next 2 weeks.  Encouraged her to go ahead and restart her losartan though I really would like to check a baseline renal function to make sure that it is okay as she has not had labs in about 18 months.  Not sure why her blood pressure was low the day that EMS came she certainly could have been mildly dehydrated though she reports that she drinks fluid pretty regularly.  We also discussed the possibility of hypoglycemia since she often will skip days before she eats.      Relevant Medications   atorvastatin (LIPITOR) 80 MG tablet   losartan (COZAAR) 50 MG tablet    Other Relevant Orders   CBC (Completed)   COMPLETE METABOLIC PANEL WITH GFR (Completed)   Lipid panel (Completed)   TSH (Completed)   Hemoglobin A1c (Completed)     Endocrine   IFG (impaired fasting glucose)    With a prior history of impaired fasting glucose I am concerned that because of glucose dysregulation she may actually be having some hypoglycemic events.  She says her husband actually has a glucometer so just encouraged her to check her glucose when she has 1 of these "episodes" to see if her blood sugar might actually be running a little bit low.      Relevant Orders   CBC (Completed)   COMPLETE METABOLIC PANEL WITH GFR (Completed)   Lipid panel (Completed)   TSH (Completed)   Hemoglobin A1c (Completed)     Genitourinary   Chronic kidney disease (CKD) stage G3b/A1, moderately decreased glomerular filtration rate (GFR) between 30-44 mL/min/1.73 square meter and albuminuria creatinine ratio less than 30 mg/g     Other   Hyperlipidemia    Due to refill medications and recheck lipid levels.      Relevant Medications   atorvastatin (LIPITOR) 80 MG tablet   losartan (COZAAR) 50 MG tablet    Other Visit Diagnoses    Abnormal weight loss       Relevant Orders   CBC (Completed)   COMPLETE METABOLIC PANEL WITH GFR (Completed)   Lipid panel (Completed)   TSH (Completed)   Hemoglobin A1c (Completed)   Abnormal EKG       Relevant Orders   CBC (Completed)   COMPLETE METABOLIC PANEL WITH GFR (Completed)   Lipid panel (Completed)   TSH (Completed)   Hemoglobin A1c (Completed)   Screening mammogram, encounter for       Relevant Orders   MM SCREENING BREAST TOMO BILATERAL   Skin candidiasis       Relevant Medications   fluconazole (DIFLUCAN) 150 MG tablet   RBBB       Relevant Medications   atorvastatin (LIPITOR) 80 MG tablet   losartan (COZAAR) 50 MG tablet     Abnormal weight loss-she has lost a lot of weight almost 28 pounds since I saw her a year and a half ago.   It certainly could be gradual and from decreased p.o. intake but I also noticed that she is not up-to-date on some of her cancer screenings and I would like to get that up-to-date.  Also would like to get some baseline lab work to make sure that were not missing anything such as thyroid disease renal failure etc.  Did encourage her to try to eat more regularly it does help the bodies glucose regulation.  Skin candidiasis-we tried topical nystatin in the past and she says it was not helpful so we will try oral fluconazole.  Abnormal EKG-since she was told by EMS that she had an abnormal EKG we did a EKG here today she did not have a copy of the original 1.  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  It does not look significantly changed from previous EKG in 2017.  Meds ordered this encounter  Medications  . atorvastatin (LIPITOR) 80 MG tablet    Sig: TAKE 1 TABLET(80 MG) BY MOUTH DAILY    Dispense:  90 tablet    Refill:  3  . fluconazole (DIFLUCAN) 150 MG tablet    Sig: Take 1 tablet (150 mg total) by mouth every other day.    Dispense:  3 tablet    Refill:  0  . losartan (COZAAR) 50 MG tablet    Sig: Take 1 tablet (50 mg total) by mouth daily.    Dispense:  90 tablet    Refill:  0    Follow-up: Return in about 2 weeks (around 04/25/2019) for follow up .   Time spent 40 minutes, greater than 50% time spent face-to-face counseling about abnormal weight, weakness episodes, abnormal EKG, skin candidiasis, uncontrolled hypertension, hyperlipidemia.  Beatrice Lecher, MD

## 2019-04-11 NOTE — Progress Notes (Signed)
Subjective:    CC: follow up from EMS/elevated BP   HPI:  1 week ago, patient called EMS for her husband who was not urinating. While they were taking her husband she told EMS "something is not right" and asked them to take her BP. The found her BP to be low (double digits) and recommended she also be taken by ambulance to the hospital. She was unable to describe specifically what was wrong. She refused EMS transportation due to not wanting to leave her dogs at home alone. She reports increasing breathing over the past few days. Denies chest pain, pressure, palpitations, syncope, fever, body aches, diaphoresis, dizziness. Patient reports diminished appetite. She eats 1 meal/day and reports sometimes she goes days without eating. She describes dieting and trying to lose weight through a weight loss pill, lipozine. She describes weakness when standing for long periods of time which is why she doesn't cook.   She has been taking her BP at home and this morning found it to be 189/107.    Past medical history, Surgical history, Family history not pertinant except as noted below, Social history, Allergies, and medications have been entered into the medical record, reviewed, and corrections made.   Review of Systems: No fevers, chills, night sweats, chest pain.  Weight loss and SOB per HPI.  Objective:    General: Well Developed, well nourished, and in no acute distress.  Neuro: Alert and oriented x3, extra-ocular muscles intact, sensation grossly intact.  HEENT: Normocephalic, atraumatic  Skin: Warm and dry, no rashes. Cardiac: Regular rate and rhythm, no murmurs rubs or gallops, no lower extremity edema.  Respiratory: Clear to auscultation bilaterally. Not using accessory muscles, speaking in full sentences.   Impression and Recommendations:   Patient may be having hypoglycemic episodes given hypotension and decreased appetite. Will order CBC, CMP, lipids.

## 2019-04-12 ENCOUNTER — Other Ambulatory Visit: Payer: Self-pay | Admitting: *Deleted

## 2019-04-12 DIAGNOSIS — R748 Abnormal levels of other serum enzymes: Secondary | ICD-10-CM

## 2019-04-12 LAB — COMPLETE METABOLIC PANEL WITH GFR
AG Ratio: 1.3 (calc) (ref 1.0–2.5)
ALT: 35 U/L — ABNORMAL HIGH (ref 6–29)
AST: 38 U/L — ABNORMAL HIGH (ref 10–35)
Albumin: 3.9 g/dL (ref 3.6–5.1)
Alkaline phosphatase (APISO): 150 U/L (ref 37–153)
BUN/Creatinine Ratio: 22 (calc) (ref 6–22)
BUN: 23 mg/dL (ref 7–25)
CO2: 30 mmol/L (ref 20–32)
Calcium: 9.8 mg/dL (ref 8.6–10.4)
Chloride: 106 mmol/L (ref 98–110)
Creat: 1.04 mg/dL — ABNORMAL HIGH (ref 0.50–0.99)
GFR, Est African American: 67 mL/min/{1.73_m2} (ref >=60)
GFR, Est Non African American: 58 mL/min/{1.73_m2} — ABNORMAL LOW (ref >=60)
Globulin: 3 g/dL (calc) (ref 1.9–3.7)
Glucose, Bld: 90 mg/dL (ref 65–99)
Potassium: 4.7 mmol/L (ref 3.5–5.3)
Sodium: 144 mmol/L (ref 135–146)
Total Bilirubin: 0.4 mg/dL (ref 0.2–1.2)
Total Protein: 6.9 g/dL (ref 6.1–8.1)

## 2019-04-12 LAB — CBC
HCT: 41.7 % (ref 35.0–45.0)
Hemoglobin: 13.4 g/dL (ref 11.7–15.5)
MCH: 29.2 pg (ref 27.0–33.0)
MCHC: 32.1 g/dL (ref 32.0–36.0)
MCV: 90.8 fL (ref 80.0–100.0)
MPV: 10.6 fL (ref 7.5–12.5)
Platelets: 314 10*3/uL (ref 140–400)
RBC: 4.59 10*6/uL (ref 3.80–5.10)
RDW: 15.4 % — ABNORMAL HIGH (ref 11.0–15.0)
WBC: 6.2 10*3/uL (ref 3.8–10.8)

## 2019-04-12 LAB — HEMOGLOBIN A1C
Hgb A1c MFr Bld: 5.3 % of total Hgb (ref <5.7)
Mean Plasma Glucose: 105 (calc)
eAG (mmol/L): 5.8 (calc)

## 2019-04-12 LAB — LIPID PANEL
Cholesterol: 179 mg/dL (ref <200)
HDL: 61 mg/dL (ref >=50)
LDL Cholesterol (Calc): 93 mg/dL (calc)
Non-HDL Cholesterol (Calc): 118 mg/dL (calc) (ref <130)
Total CHOL/HDL Ratio: 2.9 (calc) (ref <5.0)
Triglycerides: 146 mg/dL (ref <150)

## 2019-04-12 LAB — TSH: TSH: 2.04 mIU/L (ref 0.40–4.50)

## 2019-04-12 MED ORDER — LOSARTAN POTASSIUM 50 MG PO TABS: 50.0000 mg | ORAL_TABLET | Freq: Every day | ORAL | 0 refills | Status: DC

## 2019-04-12 NOTE — Assessment & Plan Note (Signed)
With a prior history of impaired fasting glucose I am concerned that because of glucose dysregulation she may actually be having some hypoglycemic events.  She says her husband actually has a glucometer so just encouraged her to check her glucose when she has 1 of these "episodes" to see if her blood sugar might actually be running a little bit low.

## 2019-04-12 NOTE — Addendum Note (Signed)
Addended byAnnamaria Helling on: 04/12/2019 10:42 AM   Modules accepted: Orders

## 2019-04-12 NOTE — Assessment & Plan Note (Signed)
Due to refill medications and recheck lipid levels.

## 2019-04-12 NOTE — Assessment & Plan Note (Signed)
Uncontrolled.  Encouraged her to check her blood pressure at home a couple times a week for the next 2 weeks.  Encouraged her to go ahead and restart her losartan though I really would like to check a baseline renal function to make sure that it is okay as she has not had labs in about 18 months.  Not sure why her blood pressure was low the day that EMS came she certainly could have been mildly dehydrated though she reports that she drinks fluid pretty regularly.  We also discussed the possibility of hypoglycemia since she often will skip days before she eats.

## 2019-04-15 ENCOUNTER — Other Ambulatory Visit: Payer: Self-pay | Admitting: *Deleted

## 2019-04-15 MED ORDER — PANTOPRAZOLE SODIUM 40 MG PO TBEC: 40.0000 mg | DELAYED_RELEASE_TABLET | Freq: Every day | ORAL | 3 refills | Status: DC

## 2019-04-29 ENCOUNTER — Ambulatory Visit: Payer: BC Managed Care – PPO | Admitting: Family Medicine

## 2019-04-29 ENCOUNTER — Emergency Department (INDEPENDENT_AMBULATORY_CARE_PROVIDER_SITE_OTHER)
Admission: EM | Admit: 2019-04-29 | Discharge: 2019-04-29 | Disposition: A | Discharge status: Home / Self Care | Payer: BC Managed Care – PPO | Source: Home / Self Care

## 2019-04-29 ENCOUNTER — Other Ambulatory Visit: Payer: Self-pay

## 2019-04-29 ENCOUNTER — Encounter: Payer: Self-pay | Admitting: Emergency Medicine

## 2019-04-29 DIAGNOSIS — Z79899 Other long term (current) drug therapy: Secondary | ICD-10-CM | POA: Diagnosis not present

## 2019-04-29 DIAGNOSIS — I4510 Unspecified right bundle-branch block: Secondary | ICD-10-CM | POA: Diagnosis not present

## 2019-04-29 DIAGNOSIS — J9811 Atelectasis: Secondary | ICD-10-CM | POA: Diagnosis not present

## 2019-04-29 DIAGNOSIS — R918 Other nonspecific abnormal finding of lung field: Secondary | ICD-10-CM | POA: Diagnosis not present

## 2019-04-29 DIAGNOSIS — R0609 Other forms of dyspnea: Secondary | ICD-10-CM | POA: Diagnosis not present

## 2019-04-29 DIAGNOSIS — R0602 Shortness of breath: Secondary | ICD-10-CM | POA: Diagnosis not present

## 2019-04-29 DIAGNOSIS — R531 Weakness: Secondary | ICD-10-CM

## 2019-04-29 DIAGNOSIS — E860 Dehydration: Secondary | ICD-10-CM | POA: Diagnosis not present

## 2019-04-29 DIAGNOSIS — I959 Hypotension, unspecified: Secondary | ICD-10-CM | POA: Diagnosis not present

## 2019-04-29 DIAGNOSIS — Z7982 Long term (current) use of aspirin: Secondary | ICD-10-CM | POA: Diagnosis not present

## 2019-04-29 LAB — POCT CBC W AUTO DIFF (K'VILLE URGENT CARE)

## 2019-04-29 LAB — POCT FASTING CBG KUC MANUAL ENTRY: POCT Glucose (KUC): 118 mg/dL — AB (ref 70–99)

## 2019-04-29 MED ORDER — SODIUM CHLORIDE 0.9 % IV SOLN
10.00 | INTRAVENOUS | Status: DC
Start: ? — End: 2019-04-29

## 2019-04-29 MED ORDER — GENERIC EXTERNAL MEDICATION
Status: DC
Start: ? — End: 2019-04-29

## 2019-04-29 NOTE — ED Triage Notes (Signed)
Palpitations, BP 114/72, 124/82, feels weak since this morning.

## 2019-04-29 NOTE — ED Provider Notes (Signed)
[facility]    CSN: [id number] Arrival date & time: 04/29/19  1454      History   Chief Complaint Chief Complaint  Patient presents with  . Palpitations    HPI Brianna Riley is a 63 y.o. female.   HPI Brianna Riley is a 63 y.o. female presenting to UC with c/o gradually worsening generalized weakness and palpitations that started about 1 week ago, sore today.  Her BP at home was 114/72 and 124/82.  She notes she had called EMS for her husband a few weeks ago due to complications from prostate cancer. While EMS was there, she had them check her BP. She was told it was extremely low and they encouraged her to go to the hospital as well but pt refused.  She did see her PCP on 04/11/2019, was found to have a BP of 153/95, dx with uncontrolled HTN.  She was encouraged to restart her losartan and check her BP at home.  It was unknown why her BP was low for EMS.  PCP suspects dehydration and hypoglycemia as pt noted she can go a few days without eating.   Pt denies taking her BP medication as she was not certain if she was suppose to or not.  Denies HA, dizziness or chest pain.  Denies recent illness of cough, congestion, n/v/d. No new OTC supplements.     BP low in triage: 82/62, recheck was low again at 80/57.   Past Medical History:  Diagnosis Date  . Allergic rhinitis, cause unspecified   . Dyspnea   . Fatty liver   . GERD (gastroesophageal reflux disease)   . Hyperlipidemia   . Hypertension, essential, benign   . Insomnia   . Knee pain, bilateral   . Lumbago   . Migraine   . Mild depression ([place])   . Nonspecific abnormal results of liver function study   . Obesity   . Osteoarthritis   . Perimenopausal     Patient Active Problem List   Diagnosis Date Noted  . 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 IFG (impaired fasting glucose) 04/20/2016  . Fatty liver 04/06/2014  . Cervical radiculitis 12/28/2012  . Chronic kidney disease (CKD) stage G3b/A1,  moderately decreased glomerular filtration rate (GFR) between 30-44 mL/min/1.73 square meter and albuminuria creatinine ratio less than 30 mg/g 12/04/2012  . Primary osteoarthritis of right shoulder, post left total shoulder arthroplasty 02/10/2012  . Right anterior knee pain 02/10/2012  . RETINOPATHY 03/15/2010  . RENAL INSUFFICIENCY 03/15/2010  . 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 FREQUENCY, URINARY 12/08/2009  . ABNORMAL MAMMOGRAM 08/19/2009  . ALLERGIC RHINITIS CAUSE UNSPECIFIED 08/06/2008  . HYPOXEMIA 08/06/2008  . LUMBAGO 05/16/2008  . LIVER FUNCTION TESTS, ABNORMAL 11/07/2007  . DEPRESSION, MILD 03/30/2007  . Hyperlipidemia 10/04/2006  . GERD 10/04/2006  . HYPERTENSION, BENIGN ESSENTIAL 09/05/2006    Past Surgical History:  Procedure Laterality Date  . CARDIAC CATHETERIZATION  09/2006   [place], Dr. Stanford Breed  . CATARACT EXTRACTION W/ INTRAOCULAR LENS IMPLANT  2002   Right and Left  . DOBUTAMINE STRESS ECHO  08/2008  . LAPAROSCOPY ABDOMEN DIAGNOSTIC    . TOTAL SHOULDER ARTHROPLASTY Left 04/21/2016   Procedure: LEFT TOTAL SHOULDER ARTHROPLASTY;  Surgeon: Tania Ade, MD;  Location: [facility];  Service: Orthopedics;  Laterality: Left;  LEFT TOTAL SHOULDER ARTHROPLASTY    OB History   No obstetric history on file.      Home Medications    Prior to Admission  medications   Medication Sig Start Date End Date Taking? Authorizing Provider  aspirin 325 MG tablet Take 650-1,000 mg by mouth See admin instructions. Take 650 mg by mouth in the morning and take 1000 mg by mouth in the evening    [provider]  atorvastatin (LIPITOR) 80 MG tablet TAKE 1 TABLET(80 MG) BY MOUTH DAILY 04/11/19   Agapito Games, MD  fish oil-omega-3 fatty acids 1000 MG capsule Take 1 capsule by mouth daily.      [provider]  Flaxseed, Linseed, (FLAX SEED OIL) 1000 MG CAPS Take 1,000 mg by mouth daily.     [provider]  fluconazole  (DIFLUCAN) 150 MG tablet Take 1 tablet (150 mg total) by mouth every other day. 04/11/19   Agapito Games, MD  Garcinia Cambogia-Chromium 500-200 MG-MCG TABS Take 1 tablet by mouth daily.     [provider]  Garlic Oil 500 MG CAPS Take 308 mg by mouth daily.     [provider]  losartan (COZAAR) 50 MG tablet Take 1 tablet (50 mg total) by mouth daily. 04/12/19   Agapito Games, MD  MELATONIN PO Take 1 tablet by mouth daily.     [provider]  milk thistle 175 MG tablet Take 175 mg by mouth daily.    [provider]  Multiple Vitamins-Iron (MULTIVITAMIN/IRON) TABS Take 1 tablet by mouth daily.      [provider]    Family History Family History  Problem Relation Age of Onset  . Lung cancer Mother   . Heart attack Father 27  . Drug abuse Brother     Social History Social History   Tobacco Use  . Smoking status: Former Smoker    Quit date: 06/06/1996    Years since quitting: 22.9  . Smokeless tobacco: Never Used  Substance Use Topics  . Alcohol use: No    Comment: quit two years ago  . Drug use: No     Allergies   Duloxetine and Hctz [hydrochlorothiazide]   Review of Systems Review of Systems  Constitutional: Positive for fatigue. Negative for chills and fever.  HENT: Negative for congestion, ear pain, sore throat, trouble swallowing and voice change.   Respiratory: Negative for cough and shortness of breath.   Cardiovascular: Positive for palpitations. Negative for chest pain.  Gastrointestinal: Negative for abdominal pain, diarrhea, nausea and vomiting.  Musculoskeletal: Negative for arthralgias, back pain and myalgias.  Skin: Negative for rash.  Neurological: Positive for weakness ( generalized) and light-headedness. Negative for syncope, numbness and headaches.     Physical Exam Triage Vital Signs ED Triage Vitals  Enc Vitals Group     BP 04/29/19 1527 (!) 82/62     Pulse Rate 04/29/19 1540 (!) 117      Resp 04/29/19 1540 20     Temp 04/29/19 1527 98.4 F (36.9 C)     Temp Source 04/29/19 1527 Oral     SpO2 04/29/19 1527 95 %     Weight 04/29/19 1527 140 lb (63.5 kg)     Height 04/29/19 1527  (1.626 m)     Head Circumference --      Peak Flow --      Pain Score 04/29/19 1527 0     Pain Loc --      Pain Edu? --      Excl. in GC? --    No data found.  Updated Vital Signs BP (!) 77/54 (BP Location:  Right Arm)   Pulse 86   Temp 98.4 F (36.9 C) (Oral)   Resp 20   Ht 5\' 4"  (1.626 m)   Wt 140 lb (63.5 kg)   SpO2 95%   BMI 24.03 kg/m   Visual Acuity Right Eye Distance:   Left Eye Distance:   Bilateral Distance:    Right Eye Near:   Left Eye Near:    Bilateral Near:     Physical Exam Vitals signs and nursing note reviewed.  Constitutional:      General: She is not in acute distress.    Appearance: She is well-developed. She is not toxic-appearing or diaphoretic.     Comments: Appears older than stated age. Appears weak but is alert and cooperative during exam.  HENT:     Head: Normocephalic and atraumatic.     Right Ear: Tympanic membrane and ear canal normal.     Left Ear: Tympanic membrane and ear canal normal.     Mouth/Throat:     Mouth: Mucous membranes are moist.     Pharynx: Oropharynx is clear.  Neck:     Musculoskeletal: Normal range of motion.  Cardiovascular:     Rate and Rhythm: Regular rhythm. Tachycardia present.  Pulmonary:     Effort: Pulmonary effort is normal. No respiratory distress.     Breath sounds: Normal breath sounds. No stridor. No wheezing, rhonchi or rales.  Musculoskeletal: Normal range of motion.  Skin:    General: Skin is warm and dry.     Capillary Refill: Capillary refill takes less than 2 seconds.  Neurological:     General: No focal deficit present.     Mental Status: She is alert and oriented to person, place, and time.     Comments: Slow but steady gait  Psychiatric:        Behavior: Behavior normal.      UC  Treatments / Results  Labs (all labs ordered are listed, but only abnormal results are displayed) Labs Reviewed  POCT FASTING CBG [facility] MANUAL ENTRY - Abnormal; Notable for the following components:      Result Value   POCT Glucose ([facility]) 118 (*)    All other components within normal limits  COMPLETE METABOLIC PANEL WITH GFR  POCT CBC W AUTO DIFF ([facility])  POCT URINALYSIS DIP (MANUAL ENTRY)    EKG   Radiology No results found.  Procedures Procedures (including critical care time)  Medications Ordered in UC Medications - No data to display  Initial Impression / Assessment and Plan / UC Course  I have reviewed the triage vital signs and the nursing notes.  Pertinent labs & imaging results that were available during my care of the patient were reviewed by me and considered in my medical decision making (see chart for details).     CBC and CBG: unremarkable Reviewed medical records Discussed pt with PCP, Dr. Linford ArnoldMetheney, as pt was actually suppose to f/u with PCP today. Pt thought appointment was not until next Monday, 11/30.    Pt refused to try to give UA despite drinking 2-3 glasses of water while in UC. BP rechecked- 77/54.  Pt initially refused to go to ED  Had PCP come speak with pt, she refuses EMS transport but pt agreeable to have her [facility] evaluation and treatment.  Pt discharged in stable condition.   Final Clinical Impressions(s) / UC Diagnoses   Final diagnoses:  Hypotension, unspecified hypotension type  Generalized weakness     Discharge Instructions      You have declined EMS transport but it is still highly recommended you have your husband take you to [facility] for IV fluids and a possible bigger workup to help determine why your blood pressure is so low.     ED Prescriptions    None     PDMP not reviewed this encounter.   Lurene Shadow, [place] 04/29/19 1749

## 2019-04-29 NOTE — Discharge Instructions (Signed)
°  You have declined EMS transport but it is still highly recommended you have your husband take you to [facility] for IV fluids and a possible bigger workup to help determine why your blood pressure is so low.

## 2019-04-30 MED ORDER — GENERIC EXTERNAL MEDICATION
Status: DC
Start: ? — End: 2019-04-30

## 2019-05-01 ENCOUNTER — Encounter: Payer: Self-pay | Admitting: Family Medicine

## 2019-05-01 ENCOUNTER — Ambulatory Visit (INDEPENDENT_AMBULATORY_CARE_PROVIDER_SITE_OTHER): Payer: BC Managed Care – PPO | Admitting: Family Medicine

## 2019-05-01 VITALS — BP 134/79 | HR 86 | Temp 98.2°F | Ht 64.0 in | Wt 169.0 lb

## 2019-05-01 DIAGNOSIS — E861 Hypovolemia: Secondary | ICD-10-CM

## 2019-05-01 DIAGNOSIS — R7989 Other specified abnormal findings of blood chemistry: Secondary | ICD-10-CM | POA: Diagnosis not present

## 2019-05-01 DIAGNOSIS — I959 Hypotension, unspecified: Secondary | ICD-10-CM | POA: Insufficient documentation

## 2019-05-01 DIAGNOSIS — F321 Major depressive disorder, single episode, moderate: Secondary | ICD-10-CM | POA: Diagnosis not present

## 2019-05-01 DIAGNOSIS — I9589 Other hypotension: Secondary | ICD-10-CM | POA: Diagnosis not present

## 2019-05-01 LAB — D-DIMER, QUANTITATIVE: D-Dimer, Quant: 0.52 mcg/mL FEU — ABNORMAL HIGH (ref <0.50)

## 2019-05-01 MED ORDER — GENERIC EXTERNAL MEDICATION
Status: DC
Start: ? — End: 2019-05-01

## 2019-05-01 MED ORDER — FLUOXETINE HCL 10 MG PO CAPS: 10.0000 mg | ORAL_CAPSULE | Freq: Every day | ORAL | 0 refills | Status: DC

## 2019-05-01 NOTE — Assessment & Plan Note (Signed)
I do feel like she is depressed and I do feel like her not eating for days at a time is related to that.  She is the primary caretaker for her husband and I think it gets a little overwhelming at times.  So we will get a try fluoxetine.  We will start with 10 mg for the first week and then go up to 20 mg.  Would like to follow-up in about 3 to 4 weeks with a virtual visit to see how she is doing.  We could certainly also consider counseling/therapy.

## 2019-05-01 NOTE — Progress Notes (Signed)
Established Patient Office Visit  Subjective:  Patient ID: Brianna Riley, female    DOB: Mar 13, 1956  Age: 63 y.o. MRN: [id number]  CC:  Chief Complaint  Patient presents with  . Follow-up    HPI Brianna Riley presents for emergency department follow-up for hypertension.  She had had a previous episode please see prior note when she had actually called EMS for her husband.  They checked her blood pressure she was not feeling well and it was extremely low.  At that time she refused care.  She came in here for follow-up a couple weeks later blood pressure was actually little elevated..  She also admitted at that time that she would sometimes go several days without actually eating..  She then came in on Monday to urgent care because she was having some generalized weakness and palpitations that started about a week prior.  She reported that her home blood pressures actually looked really good.  She was found to be very hypotensive and was sent to the emergency department..  She actually had an appoint with me but says she got the dates confused and so went to urgent care because again she was not feeling well she was found to be profoundly hypotensive and was encouraged to go to the emergency department.  Her husband did drive her.  Unfortunately he got lost trying to get back home and got lost from his 2 hours.  He is normally his primary caretaker and he does not usually drive.  At the emergency department her EKG was normal, chest x-ray was normal.  But her D-dimer was significantly elevated at 13.  BNP was normal.  They did end up giving her 3 L of fluid for significant dehydration.  She has not had any lower extremity pain or swelling.  She says she has chronic shortness of breath she does not feel like it is worse than it ever been.  But she is also very sedentary.  She denies any chest pain.  She also reports that she is feeling depressed.  In fact we discussed briefly on Monday when I  saw her over the urgent care for a few minutes that I think she would benefit for treatment for depression.  Past Medical History:  Diagnosis Date  . Allergic rhinitis, cause unspecified   . Dyspnea   . Fatty liver   . GERD (gastroesophageal reflux disease)   . Hyperlipidemia   . Hypertension, essential, benign   . Insomnia   . Knee pain, bilateral   . Lumbago   . Migraine   . Mild depression ([place])   . Nonspecific abnormal results of liver function study   . Obesity   . Osteoarthritis   . Perimenopausal     Past Surgical History:  Procedure Laterality Date  . CARDIAC CATHETERIZATION  09/2006   [place], Dr. Stanford Breed  . CATARACT EXTRACTION W/ INTRAOCULAR LENS IMPLANT  2002   Right and Left  . DOBUTAMINE STRESS ECHO  08/2008  . LAPAROSCOPY ABDOMEN DIAGNOSTIC    . TOTAL SHOULDER ARTHROPLASTY Left 04/21/2016   Procedure: LEFT TOTAL SHOULDER ARTHROPLASTY;  Surgeon: Tania Ade, MD;  Location: [facility];  Service: Orthopedics;  Laterality: Left;  LEFT TOTAL SHOULDER ARTHROPLASTY    Family History  Problem Relation Age of Onset  . Lung cancer Mother   . Heart attack Father 85  . Drug abuse Brother     Social History   Socioeconomic History  . Marital status: Married    Spouse  name: Not on file  . Number of children: Not on file  . Years of education: Not on file  . Highest education level: Not on file  Occupational History  . Occupation: UNEMPLOYED    Associate Professormployer: UNEMPLOYED  . Occupation: Retired  Engineer, productionocial Needs  . Financial resource strain: Not on file  . Food insecurity    Worry: Not on file    Inability: Not on file  . Transportation needs    Medical: Not on file    Non-medical: Not on file  Tobacco Use  . Smoking status: Former Smoker    Quit date: 06/06/1996    Years since quitting: 22.9  . Smokeless tobacco: Never Used  Substance and Sexual Activity  . Alcohol use: No    Comment: quit two years ago  . Drug use: No  . Sexual activity: Never  Lifestyle  .  Physical activity    Days per week: Not on file    Minutes per session: Not on file  . Stress: Not on file  Relationships  . Social Musicianconnections    Talks on phone: Not on file    Gets together: Not on file    Attends religious service: Not on file    Active member of club or organization: Not on file    Attends meetings of clubs or organizations: Not on file    Relationship status: Not on file  . Intimate partner violence    Fear of current or ex partner: Not on file    Emotionally abused: Not on file    Physically abused: Not on file    Forced sexual activity: Not on file  Other Topics Concern  . 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 aspirin 325 MG tablet Take 650-1,000 mg by mouth See admin instructions. Take 650 mg by mouth in the morning and take 1000 mg by mouth in the evening    . atorvastatin (LIPITOR) 80 MG tablet TAKE 1 TABLET(80 MG) BY MOUTH DAILY 90 tablet 3  . fish oil-omega-3 fatty acids 1000 MG capsule Take 1 capsule by mouth daily.      . Flaxseed, Linseed, (FLAX SEED OIL) 1000 MG CAPS Take 1,000 mg by mouth daily.     . Garcinia Cambogia-Chromium 500-200 MG-MCG TABS Take 1 tablet by mouth daily.     . Garlic Oil 500 MG CAPS Take 742500 mg by mouth daily.     Marland Kitchen. MELATONIN PO Take 1 tablet by mouth daily.     . milk thistle 175 MG tablet Take 175 mg by mouth daily.    . Multiple Vitamins-Iron (MULTIVITAMIN/IRON) TABS Take 1 tablet by mouth daily.      . fluconazole (DIFLUCAN) 150 MG tablet Take 1 tablet (150 mg total) by mouth every other day. (Patient not taking: Reported on 05/01/2019) 3 tablet 0  . losartan (COZAAR) 50 MG tablet Take 1 tablet (50 mg total) by mouth daily. (Patient not taking: Reported on 05/01/2019) 90  tablet 0   No facility-administered medications prior to visit.     Allergies  Allergen Reactions  . Duloxetine Other (See Comments)    Irritable   . Hctz [Hydrochlorothiazide] Other (See Comments)  Caused inc in BUN/CR    ROS Review of Systems    Objective:    Physical Exam  Constitutional: She is oriented to person, place, and time. She appears well-developed and well-nourished.  HENT:  Head: Normocephalic and atraumatic.  Eyes: Conjunctivae are normal.  Neck: Neck supple. No thyromegaly present.  Cardiovascular: Normal rate, regular rhythm and normal heart sounds.  No murmur heard. Pulmonary/Chest: Effort normal and breath sounds normal.  Lymphadenopathy:    She has no cervical adenopathy.  Neurological: She is alert and oriented to person, place, and time.  Skin: Skin is warm and dry.  Psychiatric: She has a normal mood and affect. Her behavior is normal.    BP 134/79   Pulse 86   Temp 98.2 F (36.8 C) (Oral)   Ht 5\' 4"  (1.626 m)   Wt 169 lb (76.7 kg)   BMI 29.01 kg/m  Wt Readings from Last 3 Encounters:  05/01/19 169 lb (76.7 kg)  04/29/19 140 lb (63.5 kg)  04/11/19 167 lb (75.8 kg)     There are no preventive care reminders to display for this patient.  There are no preventive care reminders to display for this patient.  Lab Results  Component Value Date   TSH 2.04 04/11/2019   Lab Results  Component Value Date   WBC 6.2 04/11/2019   HGB 13.4 04/11/2019   HCT 41.7 04/11/2019   MCV 90.8 04/11/2019   PLT 314 04/11/2019   Lab Results  Component Value Date   NA 144 04/11/2019   K 4.7 04/11/2019   CO2 30 04/11/2019   GLUCOSE 90 04/11/2019   BUN 23 04/11/2019   CREATININE 1.04 (H) 04/11/2019   BILITOT 0.4 04/11/2019   ALKPHOS 114 10/24/2016   AST 38 (H) 04/11/2019   ALT 35 (H) 04/11/2019   PROT 6.9 04/11/2019   ALBUMIN 3.8 10/24/2016   CALCIUM 9.8 04/11/2019   ANIONGAP 9 04/22/2016   Lab Results  Component Value Date   CHOL 179  04/11/2019   Lab Results  Component Value Date   HDL 61 04/11/2019   Lab Results  Component Value Date   LDLCALC 93 04/11/2019   Lab Results  Component Value Date   TRIG 146 04/11/2019   Lab Results  Component Value Date   CHOLHDL 2.9 04/11/2019   Lab Results  Component Value Date   HGBA1C 5.3 04/11/2019      Assessment & Plan:   Problem List Items Addressed This Visit      Cardiovascular and Mediastinum   Hypotension   Relevant Orders   Ambulatory referral to Cardiology     Other   Current moderate episode of major depressive disorder without prior episode (HCC)    I do feel like she is depressed and I do feel like her not eating for days at a time is related to that.  She is the primary caretaker for her husband and I think it gets a little overwhelming at times.  So we will get a try fluoxetine.  We will start with 10 mg for the first week and then go up to 20 mg.  Would like to follow-up in about 3 to 4 weeks with a virtual visit to see how she is doing.  We could certainly also consider counseling/therapy.      Relevant Medications   FLUoxetine (PROZAC) 10 MG capsule    Other Visit Diagnoses    Elevated d-dimer    -  Primary   Relevant Orders  D-dimer, quantitative   Ambulatory referral to Cardiology     Hypotension-her blood pressure actually seems to be much better today in the normal range which is good hold on the losartan completely remove it from her medication list she says she is out of it anyway so that is perfect that way she will accidentally restart it.  We just can continue to keep an eye on it encouraged her to make sure that she is staying well-hydrated.  She says she feels like she does a good job staying hydrated normally.  I am also going go ahead and refer her to cardiology as this is the second time is happened in a short period of time.  The first time she was taking her blood pressure pill but the second time she was actually not on her  losartan.  It looks like she was dehydrated but she reports that she actually drinks plenty of fluid on a daily basis so things just do not seem to be matching up.  She was also experiencing palpitations.  Again that could have been secondary to dehydration but I still wonder if it could somehow be related.  So again I would like cardiology consultation to make sure that were not missing anything.  Elevated D-dimer-unclear etiology it is little bit unusual in its significantly elevated.  Actually like to recheck it today today to see if maybe it is an error.  She really does not have any lower extremity swelling or pain or recent chest pain to indicate a pulmonary embolism or DVT.   Meds ordered this encounter  Medications  . FLUoxetine (PROZAC) 10 MG capsule    Sig: Take 1 capsule (10 mg total) by mouth daily. After one week increase to 2 tabs daily.    Dispense:  60 capsule    Refill:  0    Follow-up: Return in about 3 weeks (around 05/22/2019) for virtual visit for new start mood medication .    Nani Gasser, MD

## 2019-05-21 ENCOUNTER — Encounter: Payer: Self-pay | Admitting: Family Medicine

## 2019-05-21 ENCOUNTER — Ambulatory Visit (INDEPENDENT_AMBULATORY_CARE_PROVIDER_SITE_OTHER): Payer: BC Managed Care – PPO | Admitting: Family Medicine

## 2019-05-21 DIAGNOSIS — F321 Major depressive disorder, single episode, moderate: Secondary | ICD-10-CM | POA: Diagnosis not present

## 2019-05-21 NOTE — Progress Notes (Signed)
Pt reports that she has been taking the Fluoxetine 10 mg for about 5 days and feels that she is doing well on this dose.   She asked about the referral for therapy because she hasn't heard anything and also added that should they call her she DOESN'T ANSWER HER PHONE IN THE [place]

## 2019-05-21 NOTE — Progress Notes (Signed)
Virtual Visit via Telephone Note  I connected with Brianna Riley on 05/21/19 at  4:00 PM EST by telephone and verified that I am speaking with the correct person using two identifiers.   I discussed the limitations, risks, security and privacy concerns of performing an evaluation and management service by telephone and the availability of in person appointments. I also discussed with the patient that there may be a patient responsible charge related to this service. The patient expressed understanding and agreed to proceed.   Subjective:    CC: MDD  HPI: Follow- up major depressive disorder-still not eating very regularly.  She said she did not realize she was supposed to go pick up the prescription until a few days ago when the pharmacy called.  She did start the fluoxetine and has been on it for about 5 days.  So far she is tolerating it well and has not had any problems or complications she has not heard back from the therapist referral yet.  But says that she does not have a voicemail and she does not answer her phone in the morning.  Hypotension -we will also place a cardiology referral but she says she has not heard back from them.  Past medical history, Surgical history, Family history not pertinant except as noted below, Social history, Allergies, and medications have been entered into the medical record, reviewed, and corrections made.   Review of Systems: No fevers, chills, night sweats, weight loss, chest pain, or shortness of breath.   Objective:    General: Speaking clearly in complete sentences without any shortness of breath.  Alert and oriented x3.  Normal judgment. No apparent acute distress.    Impression and Recommendations:    MDD -so far doing well with fluoxetine but she is only been on it for about 5 days.  I like to regroup again in about 4 to 6 weeks to make sure that she is doing well.  We will reach back out to therapist/counseling and see if we can get her  in.  I am not sure if they tried to contact her she does not have a voicemail so that might be part of the problem.  Hypotension-we will also reach back out to cardiology and see if we can find out if they have also tried to be in touch with her.  I will have a referral coordinator give her a call back this afternoon.    I discussed the assessment and treatment plan with the patient. The patient was provided an opportunity to ask questions and all were answered. The patient agreed with the plan and demonstrated an understanding of the instructions.   The patient was advised to call back or seek an in-person evaluation if the symptoms worsen or if the condition fails to improve as anticipated.  I provided 15 minutes of non-face-to-face time during this encounter.   Beatrice Lecher, MD

## 2019-05-22 ENCOUNTER — Telehealth: Payer: Self-pay

## 2019-05-22 NOTE — Telephone Encounter (Signed)
REFERRAL ON FILE FROM CATHERINE Deschutes ,SENT REFERRAL TO [address]

## 2019-06-10 ENCOUNTER — Other Ambulatory Visit: Payer: Self-pay | Admitting: *Deleted

## 2019-06-10 MED ORDER — FLUOXETINE HCL 10 MG PO CAPS: 20.0000 mg | ORAL_CAPSULE | Freq: Every day | ORAL | 1 refills | Status: AC

## 2019-06-12 ENCOUNTER — Ambulatory Visit: Payer: BC Managed Care – PPO | Admitting: Sports Medicine

## 2019-06-13 ENCOUNTER — Ambulatory Visit: Admitting: Professional

## 2019-06-19 ENCOUNTER — Ambulatory Visit: Payer: BC Managed Care – PPO | Admitting: Sports Medicine

## 2019-08-06 ENCOUNTER — Other Ambulatory Visit: Payer: Self-pay | Admitting: *Deleted

## 2019-08-06 DIAGNOSIS — I10 Essential (primary) hypertension: Secondary | ICD-10-CM

## 2019-08-06 MED ORDER — LOSARTAN POTASSIUM 50 MG PO TABS: 50.0000 mg | ORAL_TABLET | Freq: Every day | ORAL | 1 refills | Status: DC

## 2019-10-08 ENCOUNTER — Ambulatory Visit: Payer: BC Managed Care – PPO | Admitting: Family Medicine

## 2019-10-09 ENCOUNTER — Ambulatory Visit: Payer: BC Managed Care – PPO | Admitting: Sports Medicine

## 2019-11-11 ENCOUNTER — Ambulatory Visit (INDEPENDENT_AMBULATORY_CARE_PROVIDER_SITE_OTHER): Payer: BC Managed Care – PPO | Admitting: Sports Medicine

## 2019-11-11 DIAGNOSIS — M19011 Primary osteoarthritis, right shoulder: Secondary | ICD-10-CM

## 2019-11-11 MED ORDER — ACETAMINOPHEN-CODEINE #3 300-30 MG PO TABS: 1.0000 | ORAL_TABLET | Freq: Four times a day (QID) | ORAL | 0 refills | Status: DC | PRN

## 2019-11-11 NOTE — Progress Notes (Signed)
° ° °  Procedures performed today:    Procedure: Real-time Ultrasound Guided injection of the Right glenohumeral joint Device: Samsung HS60  Verbal informed consent obtained.  Time-out conducted.  Noted no overlying erythema, induration, or other signs of local infection.  Skin prepped in a sterile fashion.  Local anesthesia: Topical Ethyl chloride.  With sterile technique and under real time ultrasound guidance: 1 cc Kenalog 40, 2 cc lidocaine, 2 cc bupivacaine injected easily Completed without difficulty  Pain immediately resolved suggesting accurate placement of the medication.  Advised to call if fevers/chills, erythema, induration, drainage, or persistent bleeding.  Images permanently stored and available for review in the ultrasound unit.  Impression: Technically successful ultrasound guided injection.  Independent interpretation of notes and tests performed by another provider:   None.  Brief History, Exam, Impression, and Recommendations:    Primary osteoarthritis of right shoulder, post left total shoulder arthroplasty Right glenohumeral joint injection today, previous injection was over 2 years ago. Adding some Tylenol 3 per her request to control her pain until the steroid injection starts to work, she does need to follow-up with Dr. Ave Filter for any discomfort on the left side. She has no interest in an arthroplasty on the right.   ___________________________________________ Ihor Austin. Benjamin Stain, M.D., ABFM., CAQSM. Primary Care and Sports Medicine [facility]  Adjunct Instructor of Family Medicine  [employer]

## 2019-11-11 NOTE — Assessment & Plan Note (Signed)
Right glenohumeral joint injection today, previous injection was over 2 years ago. Adding some Tylenol 3 per her request to control her pain until the steroid injection starts to work, she does need to follow-up with Dr. Ave Filter for any discomfort on the left side. She has no interest in an arthroplasty on the right.

## 2019-11-19 ENCOUNTER — Ambulatory Visit: Payer: BC Managed Care – PPO | Admitting: Family Medicine

## 2019-11-19 NOTE — Progress Notes (Deleted)
Established Patient Office Visit  Subjective:  Patient ID: Brianna Riley, female    DOB: [birth year]  Age: 64 y.o. MRN: [id number]  CC: No chief complaint on file.   HPI Brianna Riley presents for   Impaired fasting glucose-no increased thirst or urination. No symptoms consistent with hypoglycemia.  Hypertension- Pt denies chest pain, SOB, dizziness, or heart palpitations.  Taking meds as directed w/o problems.  Denies medication side effects.    She also had elevated liver enzymes when we did her blood work back in November.  Past Medical History:  Diagnosis Date  . Allergic rhinitis, cause unspecified   . Dyspnea   . Fatty liver   . GERD (gastroesophageal reflux disease)   . Hyperlipidemia   . Hypertension, essential, benign   . Insomnia   . Knee pain, bilateral   . Lumbago   . Migraine   . Mild depression (HCC)   . Nonspecific abnormal results of liver function study   . Obesity   . Osteoarthritis   . Perimenopausal     Past Surgical History:  Procedure Laterality Date  . CARDIAC CATHETERIZATION  09/2006   Clean, Dr. Jens Som  . CATARACT EXTRACTION W/ INTRAOCULAR LENS IMPLANT  2002   Right and Left  . DOBUTAMINE STRESS ECHO  08/2008  . LAPAROSCOPY ABDOMEN DIAGNOSTIC    . 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 Lung cancer Mother   . Heart attack Father 82  . Drug abuse Brother     Social History   Socioeconomic History  . Marital status: Married    Spouse name: Not on file  . Number of children: Not on file  . Years of education: Not on file  . Highest education level: Not on file  Occupational History  . Occupation: UNEMPLOYED    Associate Professor: UNEMPLOYED  . Occupation: Retired  Tobacco Use  . Smoking status: Former  Smoker    Quit date: 06/06/1996    Years since quitting: 23.4  . Smokeless tobacco: Never Used  Vaping Use  . Vaping Use: Never used  Substance and Sexual Activity  . Alcohol use: No    Comment: quit two years ago  . Drug use: No  . Sexual activity: Never  Other Topics Concern  . 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 Difficulty of Paying Living Expenses:   Food Insecurity:   . Worried About Programme researcher, broadcasting/film/video in the Last Year:   . Barista in the Last Year:   Transportation Needs:   . Freight forwarder (Medical):   Marland Kitchen Lack of Transportation (Non-Medical):   Physical Activity:   . Days of Exercise per Week:   . Minutes of Exercise per Session:   Stress:   . Feeling of Stress :   Social Connections:   . Frequency of Communication with Friends and Family:   . Frequency of Social Gatherings with Friends and Family:   . Attends Religious Services:   .  Active Member of Clubs or Organizations:   . Attends Archivist Meetings:   Marland Kitchen Marital Status:   Intimate Partner Violence:   . Fear of Current or Ex-Partner:   . Emotionally Abused:   Marland Kitchen Physically Abused:   . Sexually Abused:     Outpatient Medications Prior to Visit  Medication Sig Dispense Refill  . acetaminophen-codeine (TYLENOL #3) 300-30 MG tablet Take 1 tablet by mouth every 6 (six) hours as needed for moderate pain. 20 tablet 0  . aspirin 325 MG tablet Take 650-1,000 mg by mouth See admin instructions. Take 650 mg by mouth in the morning and take 1000 mg by mouth in the evening    . atorvastatin (LIPITOR) 80 MG tablet TAKE 1 TABLET(80 MG) BY MOUTH DAILY 90 tablet 3  . fish oil-omega-3 fatty acids 1000 MG capsule Take 1 capsule by mouth daily.      .  Flaxseed, Linseed, (FLAX SEED OIL) 1000 MG CAPS Take 1,000 mg by mouth daily.     Marland Kitchen FLUoxetine (PROZAC) 10 MG capsule Take 2 capsules (20 mg total) by mouth daily. 180 capsule 1  . Garcinia Cambogia-Chromium 500-200 MG-MCG TABS Take 1 tablet by mouth daily.     . Garlic Oil 245 MG CAPS Take 500 mg by mouth daily.     Marland Kitchen losartan (COZAAR) 50 MG tablet Take 1 tablet (50 mg total) by mouth daily. 90 tablet 1  . MELATONIN PO Take 1 tablet by mouth daily.     . milk thistle 175 MG tablet Take 175 mg by mouth daily.    . Multiple Vitamins-Iron (MULTIVITAMIN/IRON) TABS Take 1 tablet by mouth daily.       No facility-administered medications prior to visit.    Allergies  Allergen Reactions  . Duloxetine Other (See Comments)    Irritable   . Hctz [Hydrochlorothiazide] Other (See Comments)    Caused inc in BUN/CR    ROS Review of Systems    Objective:    Physical Exam  There were no vitals taken for this visit. Wt Readings from Last 3 Encounters:  05/01/19 169 lb (76.7 kg)  04/29/19 140 lb (63.5 kg)  04/11/19 167 lb (75.8 kg)     Health Maintenance Due  Topic Date Due  . COVID-19 Vaccine (1) Never done  . MAMMOGRAM  11/04/2019    There are no preventive care reminders to display for this patient.  Lab Results  Component Value Date   TSH 2.04 04/11/2019   Lab Results  Component Value Date   WBC 6.2 04/11/2019   HGB 13.4 04/11/2019   HCT 41.7 04/11/2019   MCV 90.8 04/11/2019   PLT 314 04/11/2019   Lab Results  Component Value Date   NA 144 04/11/2019   K 4.7 04/11/2019   CO2 30 04/11/2019   GLUCOSE 90 04/11/2019   BUN 23 04/11/2019   CREATININE 1.04 (H) 04/11/2019   BILITOT 0.4 04/11/2019   ALKPHOS 114 10/24/2016   AST 38 (H) 04/11/2019   ALT 35 (H) 04/11/2019   PROT 6.9 04/11/2019   ALBUMIN 3.8 10/24/2016   CALCIUM 9.8 04/11/2019   ANIONGAP 9 04/22/2016   Lab Results  Component Value Date   CHOL 179 04/11/2019   Lab Results  Component Value Date    HDL 61 04/11/2019   Lab Results  Component Value Date   LDLCALC 93 04/11/2019   Lab Results  Component Value Date   TRIG 146 04/11/2019   Lab Results  Component  Value Date   CHOLHDL 2.9 04/11/2019   Lab Results  Component Value Date   HGBA1C 5.3 04/11/2019      Assessment & Plan:   Problem List Items Addressed This Visit      Cardiovascular and Mediastinum   HYPERTENSION, BENIGN ESSENTIAL - Primary     Endocrine   IFG (impaired fasting glucose)     Other   Hyperlipidemia    Other Visit Diagnoses    Elevated liver enzymes          No orders of the defined types were placed in this encounter.   Follow-up: No follow-ups on file.    Nani Gasser, MD

## 2020-05-08 ENCOUNTER — Ambulatory Visit (INDEPENDENT_AMBULATORY_CARE_PROVIDER_SITE_OTHER): Payer: BC Managed Care – PPO | Admitting: Family Medicine

## 2020-05-08 ENCOUNTER — Other Ambulatory Visit: Payer: Self-pay

## 2020-05-08 ENCOUNTER — Encounter: Payer: Self-pay | Admitting: Family Medicine

## 2020-05-08 VITALS — BP 139/83 | HR 96 | Ht 64.0 in | Wt 163.0 lb

## 2020-05-08 DIAGNOSIS — B029 Zoster without complications: Secondary | ICD-10-CM

## 2020-05-08 DIAGNOSIS — I10 Essential (primary) hypertension: Secondary | ICD-10-CM

## 2020-05-08 MED ORDER — LOSARTAN POTASSIUM 50 MG PO TABS: 50.0000 mg | ORAL_TABLET | Freq: Every day | ORAL | 1 refills | Status: AC

## 2020-05-08 MED ORDER — VALACYCLOVIR HCL 1 G PO TABS: 1000.0000 mg | ORAL_TABLET | Freq: Three times a day (TID) | ORAL | 0 refills | Status: AC

## 2020-05-08 MED ORDER — GABAPENTIN 100 MG PO CAPS: 100.0000 mg | ORAL_CAPSULE | Freq: Three times a day (TID) | ORAL | 0 refills | Status: DC | PRN

## 2020-05-08 MED ORDER — ATORVASTATIN CALCIUM 80 MG PO TABS: ORAL_TABLET | ORAL | 1 refills | Status: AC

## 2020-05-08 NOTE — Patient Instructions (Signed)
Shingles  Shingles is an infection. It gives you a painful skin rash and blisters that have fluid in them. Shingles is caused by the same germ (virus) that causes chickenpox. Shingles only happens in people who:  Have had chickenpox.  Have been given a shot of medicine (vaccine) to protect against chickenpox. Shingles is rare in this group. The first symptoms of shingles may be itching, tingling, or pain in an area on your skin. A rash will show on your skin a few days or weeks later. The rash is likely to be on one side of your body. The rash usually has a shape like a belt or a band. Over time, the rash turns into fluid-filled blisters. The blisters will break open, change into scabs, and dry up. Medicines may:  Help with pain and itching.  Help you get better sooner.  Help to prevent long-term problems. Follow these instructions at home: Medicines  Take over-the-counter and prescription medicines only as told by your doctor.  Put on an anti-itch cream or numbing cream where you have a rash, blisters, or scabs. Do this as told by your doctor. Helping with itching and discomfort   Put cold, wet cloths (cold compresses) on the area of the rash or blisters as told by your doctor.  Cool baths can help you feel better. Try adding baking soda or dry oatmeal to the water to lessen itching. Do not bathe in hot water. Blister and rash care  Keep your rash covered with a loose bandage (dressing).  Wear loose clothing that does not rub on your rash.  Keep your rash and blisters clean. To do this, wash the area with mild soap and cool water as told by your doctor.  Check your rash every day for signs of infection. Check for: ? More redness, swelling, or pain. ? Fluid or blood. ? Warmth. ? Pus or a bad smell.  Do not scratch your rash. Do not pick at your blisters. To help you to not scratch: ? Keep your fingernails clean and cut short. ? Wear gloves or mittens when you sleep, if  scratching is a problem. General instructions  Rest as told by your doctor.  Keep all follow-up visits as told by your doctor. This is important.  Wash your hands often with soap and water. If soap and water are not available, use hand sanitizer. Doing this lowers your chance of getting a skin infection caused by germs (bacteria).  Your infection can cause chickenpox in people who have never had chickenpox or never got a shot of chickenpox vaccine. If you have blisters that did not change into scabs yet, try not to touch other people or be around other people, especially: ? Babies. ? Pregnant women. ? Children who have areas of red, itchy, or rough skin (eczema). ? Very old people who have transplants. ? People who have a long-term (chronic) sickness, like cancer or AIDS. Contact a doctor if:  Your pain does not get better with medicine.  Your pain does not get better after the rash heals.  You have any signs of infection in the rash area. These signs include: ? More redness, swelling, or pain around the rash. ? Fluid or blood coming from the rash. ? The rash area feeling warm to the touch. ? Pus or a bad smell coming from the rash. Get help right away if:  The rash is on your face or nose.  You have pain in your face or pain by   your eye.  You lose feeling on one side of your face.  You have trouble seeing.  You have ear pain, or you have ringing in your ear.  You have a loss of taste.  Your condition gets worse. Summary  Shingles gives you a painful skin rash and blisters that have fluid in them.  Shingles is an infection. It is caused by the same germ (virus) that causes chickenpox.  Keep your rash covered with a loose bandage (dressing). Wear loose clothing that does not rub on your rash.  If you have blisters that did not change into scabs yet, try not to touch other people or be around people. This information is not intended to replace advice given to you by  your health care provider. Make sure you discuss any questions you have with your health care provider. Document Revised: 09/14/2018 Document Reviewed: 01/25/2017 Elsevier Patient Education  2020 Elsevier Inc.  

## 2020-05-08 NOTE — Assessment & Plan Note (Signed)
Pressure not well controlled today but again she has been off her medicine for couple months" and send over refills and encouraged her to come back in about a month so that we can do her checkup, get up-to-date blood work to her physical Pap smear and mammogram etc.

## 2020-05-08 NOTE — Progress Notes (Addendum)
Acute Office Visit  Subjective:    Patient ID: Brianna Riley, female    DOB: 1956/02/22, 64 y.o.   MRN: [id number]  Chief Complaint  Patient presents with  . Rash     HPI Patient is in today for Rash.  She says she been getting a little discomfort in her left arm but thought it was just from her shoulder which she has problems with.  But about 2 days ago she asked her husband to scratch her back and he said he noticed a rash on her left upper back she says then last night realized it was actually spread down her left posterior arm.  She says it burns and itches and is somewhat painful.  No fevers chills or sweats.     She also has not been in some time and asked if we can send in her blood pressure pill and her cholesterol medicine she said she is actually been out of it for a couple of months.    Past Medical History:  Diagnosis Date  . Allergic rhinitis, cause unspecified   . Dyspnea   . Fatty liver   . GERD (gastroesophageal reflux disease)   . Hyperlipidemia   . Hypertension, essential, benign   . Insomnia   . Knee pain, bilateral   . Lumbago   . Migraine   . Mild depression (HCC)   . Nonspecific abnormal results of liver function study   . Obesity   . Osteoarthritis   . Perimenopausal     Past Surgical History:  Procedure Laterality Date  . CARDIAC CATHETERIZATION  09/2006   [place], Dr. Jens Som  . CATARACT EXTRACTION W/ INTRAOCULAR LENS IMPLANT  2002   Right and Left  . DOBUTAMINE STRESS ECHO  08/2008  . LAPAROSCOPY ABDOMEN DIAGNOSTIC    . 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 Lung cancer Mother   . Heart attack Father 52  . Drug abuse Brother     Social History   Socioeconomic History  . Marital status: Married    Spouse  name: Not on file  . Number of children: Not on file  . Years of education: Not on file  . Highest education level: Not on file  Occupational History  . Occupation: UNEMPLOYED    Associate Professor: UNEMPLOYED  . Occupation: Retired  Tobacco Use  . Smoking status: Former Smoker    Quit date: 06/06/1996    Years since quitting: 23.9  . Smokeless tobacco: Never Used  Vaping Use  . Vaping Use: Never used  Substance and Sexual Activity  . Alcohol use: No    Comment: quit two years ago  . Drug use: No  . Sexual activity: Never  Other Topics Concern  . 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 Difficulty of Paying Living Expenses: Not on file  Food Insecurity:   . Worried About Programme researcher, broadcasting/film/video in the Last Year: Not on file  . Ran Out of Food in  the Last Year: Not on file  Transportation Needs:   . Lack of Transportation (Medical): Not on file  . Lack of Transportation (Non-Medical): Not on file  Physical Activity:   . Days of Exercise per Week: Not on file  . Minutes of Exercise per Session: Not on file  Stress:   . Feeling of Stress : Not on file  Social Connections:   . Frequency of Communication with Friends and Family: Not on file  . Frequency of Social Gatherings with Friends and Family: Not on file  . Attends Religious Services: Not on file  . Active Member of Clubs or Organizations: Not on file  . Attends BankerClub or Organization Meetings: Not on file  . Marital Status: Not on file  Intimate Partner Violence:   . Fear of Current or Ex-Partner: Not on file  . Emotionally Abused: Not on file  . Physically Abused: Not on file  . Sexually Abused: Not on file    Outpatient Medications Prior to Visit  Medication Sig Dispense Refill  .  aspirin 325 MG tablet Take 650-1,000 mg by mouth See admin instructions. Take 650 mg by mouth in the morning and take 1000 mg by mouth in the evening    . fish oil-omega-3 fatty acids 1000 MG capsule Take 1 capsule by mouth daily.      . Flaxseed, Linseed, (FLAX SEED OIL) 1000 MG CAPS Take 1,000 mg by mouth daily.     Marland Kitchen. FLUoxetine (PROZAC) 10 MG capsule Take 2 capsules (20 mg total) by mouth daily. 180 capsule 1  . Garcinia Cambogia-Chromium 500-200 MG-MCG TABS Take 1 tablet by mouth daily.     . Garlic Oil 500 MG CAPS Take 161500 mg by mouth daily.     Marland Kitchen. MELATONIN PO Take 1 tablet by mouth daily.     . milk thistle 175 MG tablet Take 175 mg by mouth daily.    . Multiple Vitamins-Iron (MULTIVITAMIN/IRON) TABS Take 1 tablet by mouth daily.      Marland Kitchen. acetaminophen-codeine (TYLENOL #3) 300-30 MG tablet Take 1 tablet by mouth every 6 (six) hours as needed for moderate pain. 20 tablet 0  . atorvastatin (LIPITOR) 80 MG tablet TAKE 1 TABLET(80 MG) BY MOUTH DAILY 90 tablet 3  . losartan (COZAAR) 50 MG tablet Take 1 tablet (50 mg total) by mouth daily. 90 tablet 1   No facility-administered medications prior to visit.    Allergies  Allergen Reactions  . Duloxetine Other (See Comments)    Irritable   . Hctz [Hydrochlorothiazide] Other (See Comments)    Caused inc in BUN/CR    Review of Systems     Objective:    Physical Exam Vitals reviewed.  Constitutional:      Appearance: She is well-developed.  HENT:     Head: Normocephalic and atraumatic.  Eyes:     Conjunctiva/sclera: Conjunctivae normal.  Cardiovascular:     Rate and Rhythm: Normal rate.  Pulmonary:     Effort: Pulmonary effort is normal.  Skin:    General: Skin is dry.     Coloration: Skin is not pale.     Comments: Erythematous rash with flat vesicles in clusters starting on her left upper back pain going down her left posterior arm though she has a few lesions on her left anterior chest as well.  Neurological:     Mental  Status: She is alert and oriented to person, place, and time.  Psychiatric:  Behavior: Behavior normal.            BP 139/83   Pulse 96   Ht 5\' 4"  (1.626 m)   Wt 163 lb 0.6 oz (74 kg)   SpO2 99%   BMI 27.99 kg/m  Wt Readings from Last 3 Encounters:  05/08/20 163 lb 0.6 oz (74 kg)  05/01/19 169 lb (76.7 kg)  04/29/19 140 lb (63.5 kg)    Health Maintenance Due  Topic Date Due  . COVID-19 Vaccine (1) Never done  . MAMMOGRAM  11/04/2019  . INFLUENZA VACCINE  01/05/2020  . PAP SMEAR-Modifier  03/09/2020    There are no preventive care reminders to display for this patient.   Lab Results  Component Value Date   TSH 2.04 04/11/2019   Lab Results  Component Value Date   WBC 6.2 04/11/2019   HGB 13.4 04/11/2019   HCT 41.7 04/11/2019   MCV 90.8 04/11/2019   PLT 314 04/11/2019   Lab Results  Component Value Date   NA 144 04/11/2019   K 4.7 04/11/2019   CO2 30 04/11/2019   GLUCOSE 90 04/11/2019   BUN 23 04/11/2019   CREATININE 1.04 (H) 04/11/2019   BILITOT 0.4 04/11/2019   ALKPHOS 114 10/24/2016   AST 38 (H) 04/11/2019   ALT 35 (H) 04/11/2019   PROT 6.9 04/11/2019   ALBUMIN 3.8 10/24/2016   CALCIUM 9.8 04/11/2019   ANIONGAP 9 04/22/2016   Lab Results  Component Value Date   CHOL 179 04/11/2019   Lab Results  Component Value Date   HDL 61 04/11/2019   Lab Results  Component Value Date   LDLCALC 93 04/11/2019   Lab Results  Component Value Date   TRIG 146 04/11/2019   Lab Results  Component Value Date   CHOLHDL 2.9 04/11/2019   Lab Results  Component Value Date   HGBA1C 5.3 04/11/2019       Assessment & Plan:   Problem List Items Addressed This Visit      Cardiovascular and Mediastinum   HYPERTENSION, BENIGN ESSENTIAL    Pressure not well controlled today but again she has been off her medicine for couple months" and send over refills and encouraged her to come back in about a month so that we can do her checkup, get up-to-date  blood work to her physical Pap smear and mammogram etc.      Relevant Medications   atorvastatin (LIPITOR) 80 MG tablet   losartan (COZAAR) 50 MG tablet    Other Visit Diagnoses    Herpes zoster without complication    -  Primary   Relevant Medications   valACYclovir (VALTREX) 1000 MG tablet   gabapentin (NEURONTIN) 100 MG capsule    Shingles-rash is classic for shingles and dermatome distribution on the left side of her body though she has a couple of lesions on the left anterior chest just above the breast area.  We will treat with oral valacyclovir and gabapentin.  Recommend that she also pick up some topical lidocaine gel for pain relief.  Follow-up care discussed.   Meds ordered this encounter  Medications  . valACYclovir (VALTREX) 1000 MG tablet    Sig: Take 1 tablet (1,000 mg total) by mouth 3 (three) times daily for 7 days.    Dispense:  21 tablet    Refill:  0  . gabapentin (NEURONTIN) 100 MG capsule    Sig: Take 1 capsule (100 mg total) by mouth 3 (three) times daily as needed.  For pain from shingles    Dispense:  60 capsule    Refill:  0  . atorvastatin (LIPITOR) 80 MG tablet    Sig: TAKE 1 TABLET(80 MG) BY MOUTH DAILY    Dispense:  90 tablet    Refill:  1  . losartan (COZAAR) 50 MG tablet    Sig: Take 1 tablet (50 mg total) by mouth daily.    Dispense:  90 tablet    Refill:  1     Nani Gasser, MD

## 2020-05-08 NOTE — Addendum Note (Signed)
Addended by: Nani Gasser D on: 05/08/2020 04:49 PM   Modules accepted: Orders

## 2020-05-27 ENCOUNTER — Other Ambulatory Visit: Payer: Self-pay | Admitting: Family Medicine

## 2020-05-27 DIAGNOSIS — B029 Zoster without complications: Secondary | ICD-10-CM

## 2020-06-13 ENCOUNTER — Other Ambulatory Visit: Payer: Self-pay | Admitting: Family Medicine

## 2020-06-13 DIAGNOSIS — B029 Zoster without complications: Secondary | ICD-10-CM

## 2020-09-25 ENCOUNTER — Other Ambulatory Visit: Payer: Self-pay | Admitting: Family Medicine

## 2020-09-25 DIAGNOSIS — Z1231 Encounter for screening mammogram for malignant neoplasm of breast: Secondary | ICD-10-CM

## 2020-10-01 ENCOUNTER — Ambulatory Visit: Payer: BC Managed Care – PPO

## 2020-10-13 ENCOUNTER — Encounter: Payer: BC Managed Care – PPO | Admitting: Family Medicine

## 2021-06-13 DIAGNOSIS — E119 Type 2 diabetes mellitus without complications: Secondary | ICD-10-CM | POA: Diagnosis not present

## 2021-06-13 DIAGNOSIS — A419 Sepsis, unspecified organism: Secondary | ICD-10-CM | POA: Diagnosis not present

## 2021-06-13 DIAGNOSIS — R710 Precipitous drop in hematocrit: Secondary | ICD-10-CM | POA: Diagnosis not present

## 2021-06-13 DIAGNOSIS — I21A1 Myocardial infarction type 2: Secondary | ICD-10-CM | POA: Diagnosis not present

## 2021-06-13 DIAGNOSIS — R42 Dizziness and giddiness: Secondary | ICD-10-CM | POA: Diagnosis not present

## 2021-06-13 DIAGNOSIS — A4189 Other specified sepsis: Secondary | ICD-10-CM | POA: Diagnosis not present

## 2021-06-13 DIAGNOSIS — U071 COVID-19: Secondary | ICD-10-CM | POA: Diagnosis not present

## 2021-06-13 DIAGNOSIS — R0600 Dyspnea, unspecified: Secondary | ICD-10-CM | POA: Diagnosis not present

## 2021-06-13 DIAGNOSIS — J1282 Pneumonia due to coronavirus disease 2019: Secondary | ICD-10-CM | POA: Diagnosis not present

## 2021-06-13 DIAGNOSIS — Z743 Need for continuous supervision: Secondary | ICD-10-CM | POA: Diagnosis not present

## 2021-06-13 DIAGNOSIS — R270 Ataxia, unspecified: Secondary | ICD-10-CM | POA: Diagnosis not present

## 2021-06-13 DIAGNOSIS — J9600 Acute respiratory failure, unspecified whether with hypoxia or hypercapnia: Secondary | ICD-10-CM | POA: Diagnosis not present

## 2021-06-13 DIAGNOSIS — R6520 Severe sepsis without septic shock: Secondary | ICD-10-CM | POA: Diagnosis not present

## 2021-06-13 DIAGNOSIS — I959 Hypotension, unspecified: Secondary | ICD-10-CM | POA: Diagnosis not present

## 2021-06-13 DIAGNOSIS — J9602 Acute respiratory failure with hypercapnia: Secondary | ICD-10-CM | POA: Diagnosis not present

## 2021-06-13 DIAGNOSIS — F4310 Post-traumatic stress disorder, unspecified: Secondary | ICD-10-CM | POA: Diagnosis not present

## 2021-06-13 DIAGNOSIS — I10 Essential (primary) hypertension: Secondary | ICD-10-CM | POA: Diagnosis not present

## 2021-06-13 DIAGNOSIS — M6281 Muscle weakness (generalized): Secondary | ICD-10-CM | POA: Diagnosis not present

## 2021-06-13 DIAGNOSIS — E785 Hyperlipidemia, unspecified: Secondary | ICD-10-CM | POA: Diagnosis not present

## 2021-06-13 DIAGNOSIS — R7989 Other specified abnormal findings of blood chemistry: Secondary | ICD-10-CM | POA: Diagnosis not present

## 2021-06-13 DIAGNOSIS — E049 Nontoxic goiter, unspecified: Secondary | ICD-10-CM | POA: Diagnosis not present

## 2021-06-13 DIAGNOSIS — I4510 Unspecified right bundle-branch block: Secondary | ICD-10-CM | POA: Diagnosis not present

## 2021-06-13 DIAGNOSIS — J9601 Acute respiratory failure with hypoxia: Secondary | ICD-10-CM | POA: Diagnosis not present

## 2021-06-24 DIAGNOSIS — R0600 Dyspnea, unspecified: Secondary | ICD-10-CM | POA: Diagnosis not present

## 2021-06-24 DIAGNOSIS — E785 Hyperlipidemia, unspecified: Secondary | ICD-10-CM | POA: Diagnosis not present

## 2021-06-24 DIAGNOSIS — U099 Post covid-19 condition, unspecified: Secondary | ICD-10-CM | POA: Diagnosis not present

## 2021-06-24 DIAGNOSIS — J9601 Acute respiratory failure with hypoxia: Secondary | ICD-10-CM | POA: Diagnosis not present

## 2021-06-24 DIAGNOSIS — J1281 Pneumonia due to SARS-associated coronavirus: Secondary | ICD-10-CM | POA: Diagnosis not present

## 2021-06-24 DIAGNOSIS — A419 Sepsis, unspecified organism: Secondary | ICD-10-CM | POA: Diagnosis not present

## 2021-06-24 DIAGNOSIS — Z23 Encounter for immunization: Secondary | ICD-10-CM | POA: Diagnosis not present

## 2021-06-24 DIAGNOSIS — U071 COVID-19: Secondary | ICD-10-CM | POA: Diagnosis not present

## 2021-06-24 DIAGNOSIS — Z743 Need for continuous supervision: Secondary | ICD-10-CM | POA: Diagnosis not present

## 2021-06-24 DIAGNOSIS — D539 Nutritional anemia, unspecified: Secondary | ICD-10-CM | POA: Diagnosis not present

## 2021-06-24 DIAGNOSIS — E559 Vitamin D deficiency, unspecified: Secondary | ICD-10-CM | POA: Diagnosis not present

## 2021-06-24 DIAGNOSIS — M6281 Muscle weakness (generalized): Secondary | ICD-10-CM | POA: Diagnosis not present

## 2021-06-24 DIAGNOSIS — J9600 Acute respiratory failure, unspecified whether with hypoxia or hypercapnia: Secondary | ICD-10-CM | POA: Diagnosis not present

## 2021-06-24 DIAGNOSIS — R7989 Other specified abnormal findings of blood chemistry: Secondary | ICD-10-CM | POA: Diagnosis not present

## 2021-06-24 DIAGNOSIS — R270 Ataxia, unspecified: Secondary | ICD-10-CM | POA: Diagnosis not present

## 2021-07-05 DIAGNOSIS — D539 Nutritional anemia, unspecified: Secondary | ICD-10-CM | POA: Diagnosis not present

## 2021-07-05 DIAGNOSIS — U099 Post covid-19 condition, unspecified: Secondary | ICD-10-CM | POA: Diagnosis not present

## 2021-07-05 DIAGNOSIS — E785 Hyperlipidemia, unspecified: Secondary | ICD-10-CM | POA: Diagnosis not present

## 2021-07-05 DIAGNOSIS — J1281 Pneumonia due to SARS-associated coronavirus: Secondary | ICD-10-CM | POA: Diagnosis not present

## 2021-07-07 DIAGNOSIS — E785 Hyperlipidemia, unspecified: Secondary | ICD-10-CM | POA: Diagnosis not present

## 2021-07-07 DIAGNOSIS — D539 Nutritional anemia, unspecified: Secondary | ICD-10-CM | POA: Diagnosis not present

## 2021-07-07 DIAGNOSIS — J1281 Pneumonia due to SARS-associated coronavirus: Secondary | ICD-10-CM | POA: Diagnosis not present

## 2021-07-07 DIAGNOSIS — U099 Post covid-19 condition, unspecified: Secondary | ICD-10-CM | POA: Diagnosis not present

## 2021-07-12 DIAGNOSIS — D539 Nutritional anemia, unspecified: Secondary | ICD-10-CM | POA: Diagnosis not present

## 2021-07-12 DIAGNOSIS — U099 Post covid-19 condition, unspecified: Secondary | ICD-10-CM | POA: Diagnosis not present

## 2021-07-19 DIAGNOSIS — D539 Nutritional anemia, unspecified: Secondary | ICD-10-CM | POA: Diagnosis not present

## 2021-07-19 DIAGNOSIS — U099 Post covid-19 condition, unspecified: Secondary | ICD-10-CM | POA: Diagnosis not present

## 2021-07-22 DIAGNOSIS — U099 Post covid-19 condition, unspecified: Secondary | ICD-10-CM | POA: Diagnosis not present

## 2021-07-22 DIAGNOSIS — D539 Nutritional anemia, unspecified: Secondary | ICD-10-CM | POA: Diagnosis not present

## 2021-07-22 DIAGNOSIS — E559 Vitamin D deficiency, unspecified: Secondary | ICD-10-CM | POA: Diagnosis not present

## 2021-07-29 DIAGNOSIS — Z9181 History of falling: Secondary | ICD-10-CM | POA: Diagnosis not present

## 2021-07-29 DIAGNOSIS — I252 Old myocardial infarction: Secondary | ICD-10-CM | POA: Diagnosis not present

## 2021-07-29 DIAGNOSIS — I10 Essential (primary) hypertension: Secondary | ICD-10-CM | POA: Diagnosis not present

## 2021-07-29 DIAGNOSIS — J9601 Acute respiratory failure with hypoxia: Secondary | ICD-10-CM | POA: Diagnosis not present

## 2021-07-29 DIAGNOSIS — J9602 Acute respiratory failure with hypercapnia: Secondary | ICD-10-CM | POA: Diagnosis not present

## 2021-07-29 DIAGNOSIS — J189 Pneumonia, unspecified organism: Secondary | ICD-10-CM | POA: Diagnosis not present

## 2021-07-29 DIAGNOSIS — Z7982 Long term (current) use of aspirin: Secondary | ICD-10-CM | POA: Diagnosis not present

## 2021-07-29 DIAGNOSIS — Z8616 Personal history of COVID-19: Secondary | ICD-10-CM | POA: Diagnosis not present

## 2021-07-30 DIAGNOSIS — Z8616 Personal history of COVID-19: Secondary | ICD-10-CM | POA: Diagnosis not present

## 2021-07-30 DIAGNOSIS — J9602 Acute respiratory failure with hypercapnia: Secondary | ICD-10-CM | POA: Diagnosis not present

## 2021-07-30 DIAGNOSIS — J189 Pneumonia, unspecified organism: Secondary | ICD-10-CM | POA: Diagnosis not present

## 2021-07-30 DIAGNOSIS — I10 Essential (primary) hypertension: Secondary | ICD-10-CM | POA: Diagnosis not present

## 2021-07-30 DIAGNOSIS — Z9181 History of falling: Secondary | ICD-10-CM | POA: Diagnosis not present

## 2021-07-30 DIAGNOSIS — I252 Old myocardial infarction: Secondary | ICD-10-CM | POA: Diagnosis not present

## 2021-07-30 DIAGNOSIS — Z7982 Long term (current) use of aspirin: Secondary | ICD-10-CM | POA: Diagnosis not present

## 2021-07-30 DIAGNOSIS — J9601 Acute respiratory failure with hypoxia: Secondary | ICD-10-CM | POA: Diagnosis not present

## 2021-08-03 DIAGNOSIS — J189 Pneumonia, unspecified organism: Secondary | ICD-10-CM | POA: Diagnosis not present

## 2021-08-03 DIAGNOSIS — J9602 Acute respiratory failure with hypercapnia: Secondary | ICD-10-CM | POA: Diagnosis not present

## 2021-08-03 DIAGNOSIS — J9601 Acute respiratory failure with hypoxia: Secondary | ICD-10-CM | POA: Diagnosis not present

## 2021-08-03 DIAGNOSIS — I252 Old myocardial infarction: Secondary | ICD-10-CM | POA: Diagnosis not present

## 2021-08-03 DIAGNOSIS — I10 Essential (primary) hypertension: Secondary | ICD-10-CM | POA: Diagnosis not present

## 2021-08-03 DIAGNOSIS — Z9181 History of falling: Secondary | ICD-10-CM | POA: Diagnosis not present

## 2021-08-03 DIAGNOSIS — Z8616 Personal history of COVID-19: Secondary | ICD-10-CM | POA: Diagnosis not present

## 2021-08-03 DIAGNOSIS — Z7982 Long term (current) use of aspirin: Secondary | ICD-10-CM | POA: Diagnosis not present

## 2021-09-22 ENCOUNTER — Encounter: Payer: Self-pay | Admitting: Family Medicine

## 2022-02-02 ENCOUNTER — Ambulatory Visit: Payer: Self-pay | Admitting: *Deleted

## 2022-02-02 ENCOUNTER — Encounter: Payer: Self-pay | Admitting: *Deleted

## 2022-02-02 NOTE — Patient Outreach (Signed)
  Care Coordination   02/02/2022 Name: Delayza Lungren MRN: [id number] DOB: 11-24-1955   Care Coordination Outreach Attempts:  An unsuccessful telephone outreach was attempted today to offer the patient information about available care coordination services as a benefit of their health plan.   Follow Up Plan:  Additional outreach attempts will be made to offer the patient care coordination information and services.   Encounter Outcome:  No Answer phone rang without physical or voice mail pick up  Care Coordination Interventions Activated:  No   Care Coordination Interventions:  No, not indicated unsuccessful attempt # 1    Caryl Pina, RN, BSN, CCRN Alumnus RN CM Care Coordination/ Transition of Care- [facility] [phone]: direct office

## 2022-02-04 ENCOUNTER — Encounter: Payer: Self-pay | Admitting: *Deleted

## 2022-02-04 ENCOUNTER — Ambulatory Visit: Payer: Self-pay | Admitting: *Deleted

## 2022-02-04 NOTE — Patient Outreach (Signed)
  Care Coordination   02/04/2022 Name: Brianna Riley MRN: [id number] DOB: [birth year]   Care Coordination Outreach Attempts:  A second unsuccessful outreach was attempted today to offer the patient with information about available care coordination services as a benefit of their health plan.     Follow Up Plan:  Additional outreach attempts will be made to offer the patient care coordination information and services.   Encounter Outcome:  No Answer phone range without physical or voice mail pick up; unable to leave message requesting call back  Care Coordination Interventions Activated:  No   Care Coordination Interventions:  No, not indicated unsuccessful outreach attempt # 2   Caryl Pina, RN, BSN, CCRN Alumnus RN CM Care Coordination/ Transition of Care- [facility] [phone]: direct office

## 2022-02-10 ENCOUNTER — Telehealth: Payer: Self-pay | Admitting: *Deleted

## 2022-02-10 ENCOUNTER — Encounter: Payer: Self-pay | Admitting: *Deleted

## 2022-02-10 NOTE — Patient Outreach (Signed)
  Care Coordination   02/10/2022 Name: Brianna Riley MRN: [id number] DOB: 07-Oct-1955   Care Coordination Outreach Attempts:  A third unsuccessful outreach was attempted today to offer the patient with information about available care coordination services as a benefit of their health plan.   Follow Up Plan:  No further outreach attempts will be made at this time. We have been unable to contact the patient to offer or enroll patient in care coordination services  Encounter Outcome:  No Answer Received automated outgoing voice message stating "your call cannot be completed at this time, please hang up and try your call again another time;" no option to leave voice message requesting call back  Care Coordination Interventions Activated:  No   Care Coordination Interventions:  No, not indicated Unsuccessful outreach attempt # 3   Caryl Pina, RN, BSN, CCRN [employer]umnus RN CM Care Coordination/ Transition of Care- [facility] Care Management [phone]: direct office

## 2023-08-24 ENCOUNTER — Telehealth: Payer: Self-pay

## 2023-08-24 NOTE — Telephone Encounter (Signed)
 I tried to call patient to see if she would conceder doing the cologuard again. Unable to leave a message.
